# Patient Record
Sex: Male | Born: 1955
Health system: Southern US, Community
[De-identification: ages and names within clinical notes are randomized; demographics above are authoritative.]

## PROBLEM LIST (undated history)

## (undated) DIAGNOSIS — E785 Hyperlipidemia, unspecified: Secondary | ICD-10-CM

## (undated) DIAGNOSIS — J309 Allergic rhinitis, unspecified: Secondary | ICD-10-CM

## (undated) DIAGNOSIS — Z9189 Other specified personal risk factors, not elsewhere classified: Secondary | ICD-10-CM

## (undated) DIAGNOSIS — E7800 Pure hypercholesterolemia, unspecified: Secondary | ICD-10-CM

## (undated) DIAGNOSIS — K219 Gastro-esophageal reflux disease without esophagitis: Secondary | ICD-10-CM

## (undated) DIAGNOSIS — I4510 Unspecified right bundle-branch block: Secondary | ICD-10-CM

## (undated) DIAGNOSIS — B029 Zoster without complications: Secondary | ICD-10-CM

## (undated) DIAGNOSIS — K4090 Unilateral inguinal hernia, without obstruction or gangrene, not specified as recurrent: Secondary | ICD-10-CM

## (undated) DIAGNOSIS — R002 Palpitations: Secondary | ICD-10-CM

## (undated) DIAGNOSIS — M489 Spondylopathy, unspecified: Secondary | ICD-10-CM

## (undated) DIAGNOSIS — N419 Inflammatory disease of prostate, unspecified: Secondary | ICD-10-CM

## (undated) DIAGNOSIS — R0600 Dyspnea, unspecified: Secondary | ICD-10-CM

## (undated) HISTORY — DX: Allergic rhinitis, unspecified: J30.9

## (undated) HISTORY — DX: Gastro-esophageal reflux disease without esophagitis: K21.9

## (undated) HISTORY — DX: Gilbert syndrome: E80.4

## (undated) HISTORY — DX: Unilateral inguinal hernia, without obstruction or gangrene, not specified as recurrent: K40.90

## (undated) HISTORY — DX: Pure hypercholesterolemia, unspecified: E78.00

## (undated) HISTORY — DX: Dyspnea, unspecified: R06.00

## (undated) HISTORY — DX: Hyperlipidemia, unspecified: E78.5

## (undated) HISTORY — DX: Palpitations: R00.2

## (undated) HISTORY — DX: Zoster without complications: B02.9

## (undated) HISTORY — DX: Other specified personal risk factors, not elsewhere classified: Z91.89

## (undated) HISTORY — DX: Unspecified right bundle-branch block: I45.10

## (undated) HISTORY — DX: Inflammatory disease of prostate, unspecified: N41.9

## (undated) HISTORY — DX: Spondylopathy, unspecified: M48.9

---

## 1999-08-25 ENCOUNTER — Emergency Department (HOSPITAL_COMMUNITY): Admission: EM | Admit: 1999-08-25 | Discharge: 1999-08-25 | Payer: Self-pay | Admitting: Emergency Medicine

## 1999-08-26 ENCOUNTER — Encounter: Payer: Self-pay | Admitting: Geriatric Medicine

## 1999-08-26 ENCOUNTER — Encounter: Admission: RE | Admit: 1999-08-26 | Discharge: 1999-08-26 | Payer: Self-pay | Admitting: Geriatric Medicine

## 1999-10-20 ENCOUNTER — Encounter: Payer: Self-pay | Admitting: Geriatric Medicine

## 1999-10-20 ENCOUNTER — Encounter: Admission: RE | Admit: 1999-10-20 | Discharge: 1999-10-20 | Payer: Self-pay | Admitting: Geriatric Medicine

## 2001-09-04 ENCOUNTER — Encounter (INDEPENDENT_AMBULATORY_CARE_PROVIDER_SITE_OTHER): Payer: Self-pay

## 2001-09-04 ENCOUNTER — Ambulatory Visit (HOSPITAL_COMMUNITY): Admission: RE | Admit: 2001-09-04 | Discharge: 2001-09-04 | Payer: Self-pay | Admitting: Surgery

## 2002-08-09 HISTORY — PX: HERNIA REPAIR: SHX51

## 2008-04-10 ENCOUNTER — Encounter: Admission: RE | Admit: 2008-04-10 | Discharge: 2008-04-10 | Payer: Self-pay | Admitting: Gastroenterology

## 2008-10-18 IMAGING — RF DG ESOPHAGUS
15 of 18 series · 19 of 24 positions shown · IV contrast (agent unspecified)
Comparison: None

CLINICAL DATA: Chronic reflux disease

BARIUM SWALLOW / ESOPHAGRAM
TECHNIQUE: Combined double contrast and single contrast
examination performed using effervescent crystals, thick barium
liquid, and thin barium liquid.
Fluoroscopy Time: 1.9 minutes
Contrast: Double contrast barium swallow

[Series 2: run · 1 of 1 slices shown (1 of 15)]
[im 1/1]
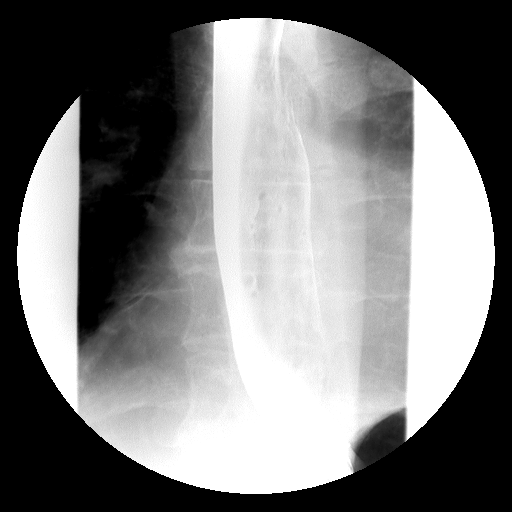

[Series 3: run · 1 of 1 slices shown (2 of 15)]
[im 1/1]
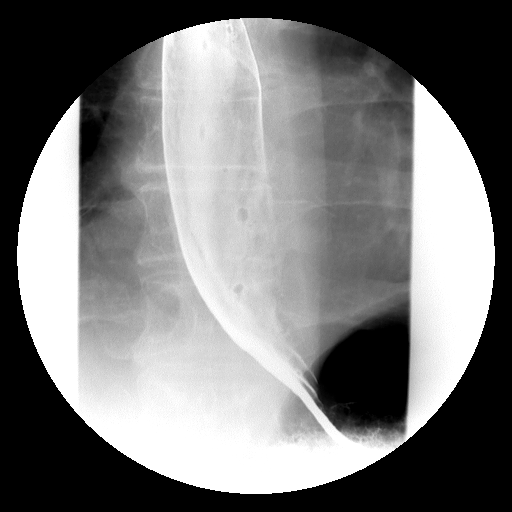

[Series 5: run · 1 of 1 slices shown (3 of 15)]
[im 1/1]
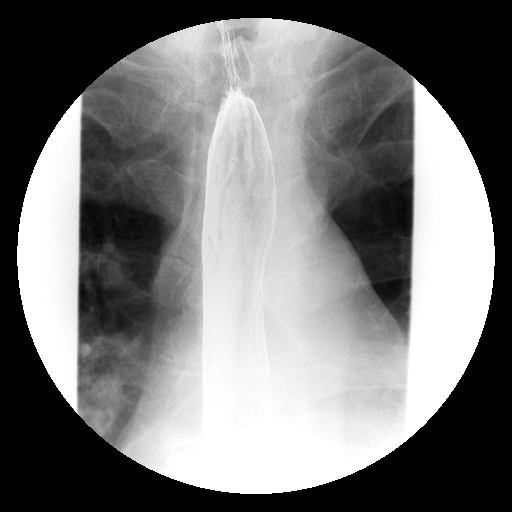

[Series 6: run · 1 of 1 slices shown (4 of 15)]
[im 1/1]
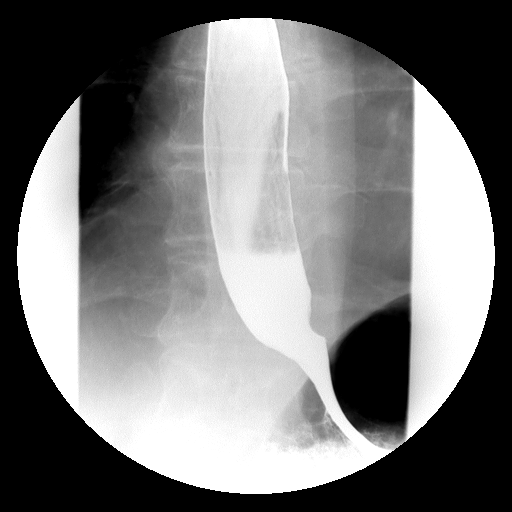

[Series 7: run · 1 of 1 slices shown (5 of 15)]
[im 1/1]
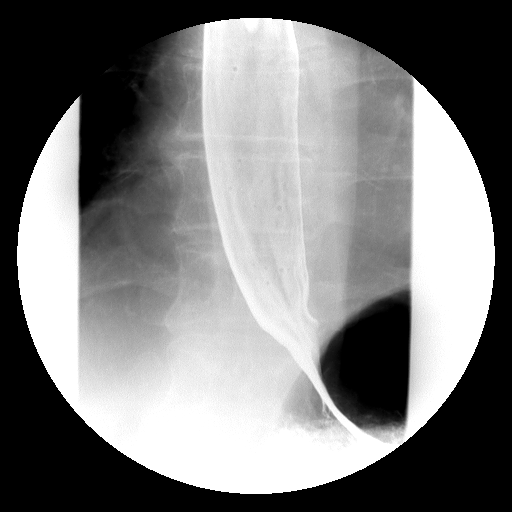

[Series 8: run · 2 of 7 slices shown (6 of 15)]
[im 1/7]
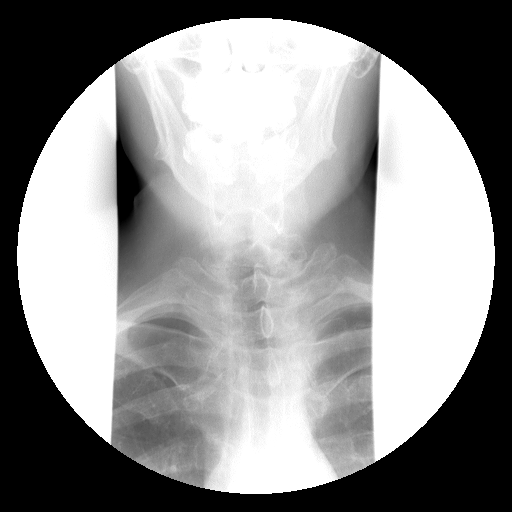
[im 7/7]
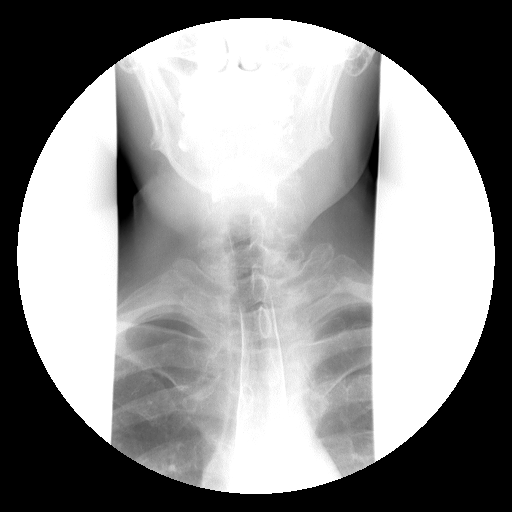

[Series 9: run · 2 of 5 slices shown (7 of 15)]
[im 1/5]
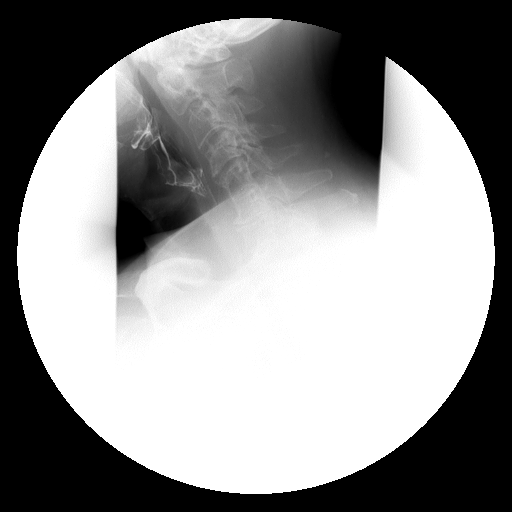
[im 5/5]
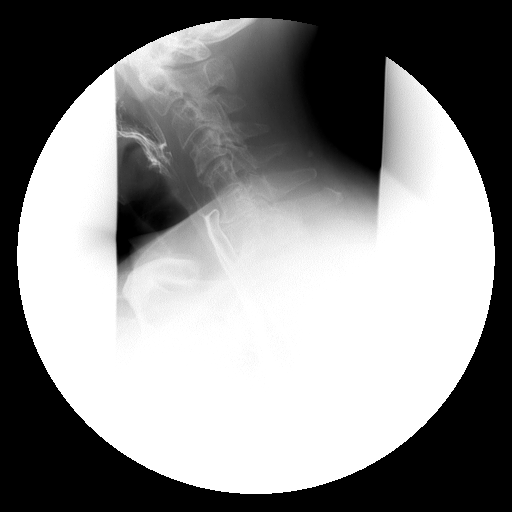

[Series 10: run · 3 of 7 slices shown (8 of 15)]
[im 3/7]
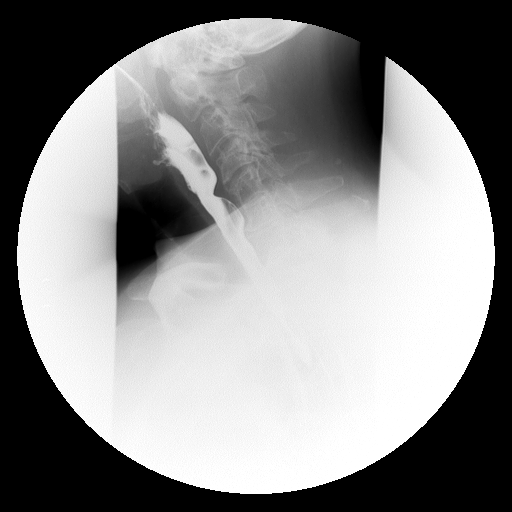
[im 5/7]
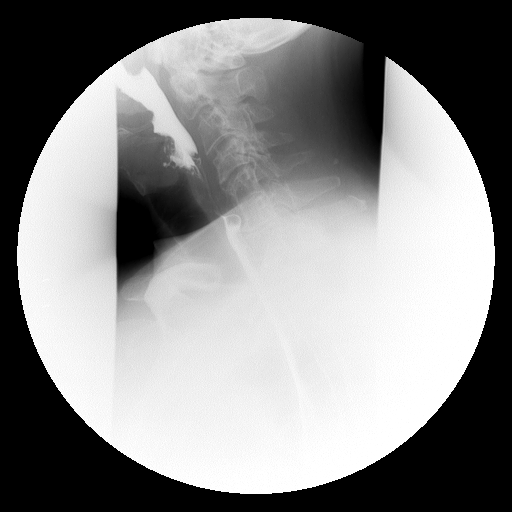
[im 7/7]
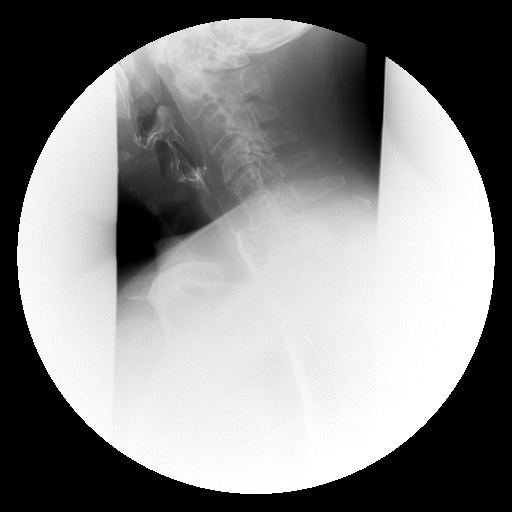

[Series 11: run · 1 of 1 slices shown (9 of 15)]
[im 1/1]
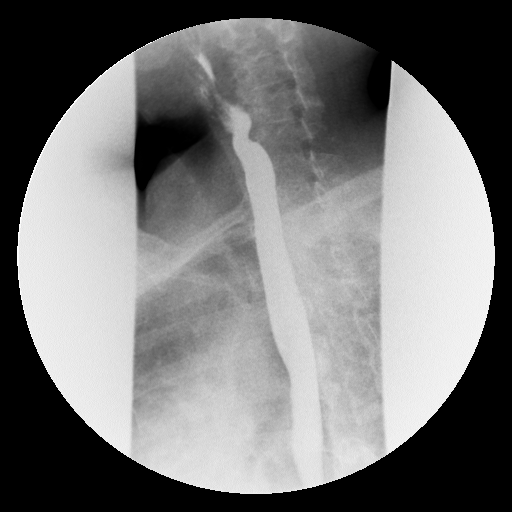

[Series 13: run · 1 of 1 slices shown (10 of 15)]
[im 1/1]
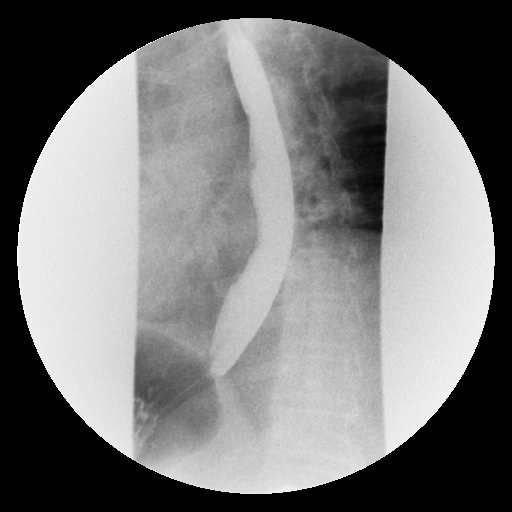

[Series 14: run · 1 of 1 slices shown (11 of 15)]
[im 1/1]
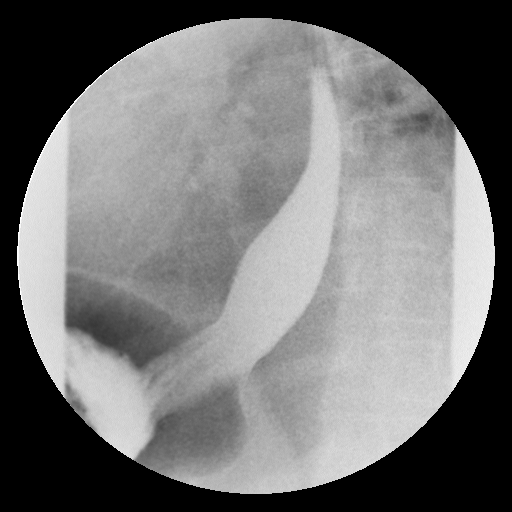

[Series 15: run · 1 of 1 slices shown (12 of 15)]
[im 1/1]
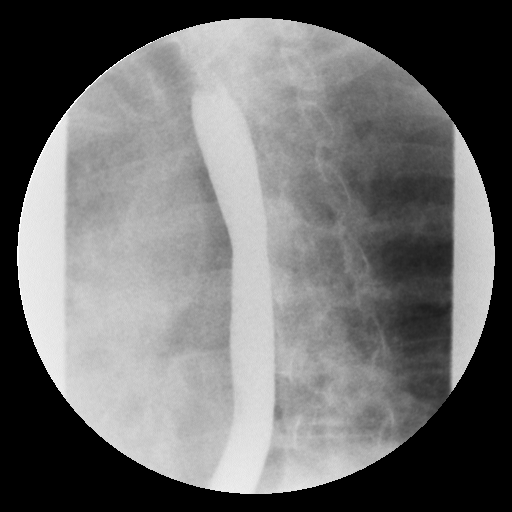

[Series 16: run · 1 of 1 slices shown (13 of 15)]
[im 1/1]
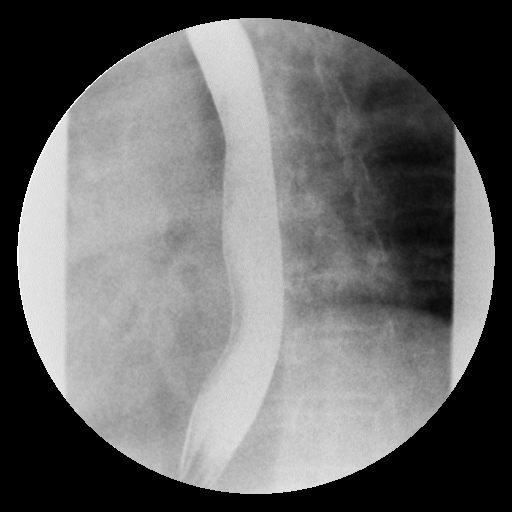

[Series 18: run · 1 of 1 slices shown (14 of 15)]
[im 1/1]
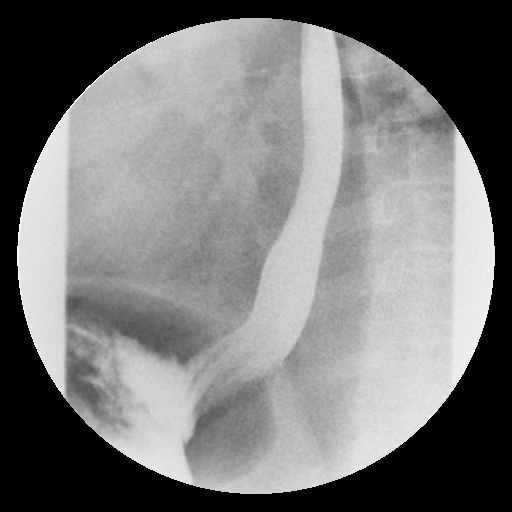

[Series 19: run · 1 of 1 slices shown (15 of 15)]
[im 1/1]
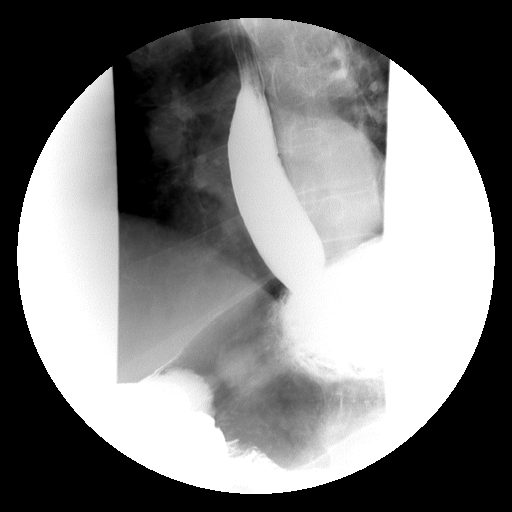

[19 of 24 positions shown; findings below may reference images not displayed]

FINDINGS: A double contrast barium swallow was initially performed.
The mucosa of the esophagus appears normal.  A single contrast
study was then performed.  The swallowing mechanism appears normal.
There is some prominence of the cricopharyngeus muscle.  There is
also slight indentation on the posterior aspect of the lower
cervical esophagus by osteophytes at the C6-7 level.  The remainder
of esophageal peristalsis is normal.  No hiatal hernia is seen.
There is mild to moderate gastroesophageal reflux noted.  A barium
pill was given at the end of the study which passed into the
stomach without delay
IMPRESSION: .
1.  Mild to moderate gastroesophageal reflux.  Somewhat prominent
cricopharyngeus muscle.
2.  Slight indentation by osteophyte anteriorly at C6-7 level.
3.  Barium pill passes in the stomach without delay.

## 2010-12-25 NOTE — Op Note (Signed)
[facility]  Patient:    Dustin Rice, Dustin Rice Visit Number: [id number] MRN: [id number]          Service Type: DSU Location: DAY Attending Physician:  Katha Cabal Dictated by:   Thornton Park Daphine Deutscher, M.D. Admit Date:  09/04/2001   CC:         Dustin Rice, M.D.                           Operative Report  CCS #:  [id number].  PREOPERATIVE DIAGNOSIS:  Right inguinal hernia, probable indirect.  POSTOPERATIVE DIAGNOSIS:  Large, right direct inguinal hernia with indirect properitoneal fatty herniation.  PROCEDURE:  Open right inguinal herniorrhaphy.  SURGEON:  Thornton Park. Daphine Deutscher, M.D.  ANESTHESIA:  Local MAC.  DESCRIPTION OF PROCEDURE:  Dustin Rice was taken to room 1 at [facility] on September 04, 2001.  He was given a dose of Ancef preoperatively and the right groin was shaved, then prepped with Betadine widely and draped sterilely.  Regional anesthesia was administered after some intravenous sedation using approximately 25 cc of a mixture of 0.5% Marcaine and lidocaine and ______ .  A small oblique incision was described, which I eventually enlarged by about 1 cm laterally to provide adequate exposure.  This area was infiltrated and the incision was made.  Electrocautery was used to control bleeding.  It was carried down to the external oblique, which was incised and opened.  The ilioinguinal nerve branch was retracted with a superior flap.  I mobilized the cord structures initially and did find prominence of the cord proximally, that I felt was probably still consistent with indirect hernia.  I went proximally and then eventually opened up the ring a little bit more.  I then incised the perimesenteric fibers without dividing them proximally, and then dissected out about a 5 cm properitoneal hernia of fat.  This was dissected free and was distinct from the vascular supply and the spermatic vessels and vas.  These were maintained and were  intact, and were separate from this.  I removed this.  He is doing a high ligation, oversewing them with 2-0 silks, and allowing it to retract up into the abdomen.  I searched diligently for any kind of sac medial to this anteriorly, and found, in fact, the floor to be quite dilated and flaccid.  We got the patient to cough and it was apparent there was a real prominent bulge all along the floor, consistent with large broad-based direct hernia.  A piece of Parietex mesh was cut to fit this area.  The mesh was sutured in place with running 2-0 Prolene along the inguinal ligament inferiorly, and into the internal oblique musculature medially; this pretty well obliterated the direct defect.  I tucked it around laterally around the cord structures, and sutured it to itself with a single horizontal mattress suture of 2-0 Prolene.  There was an adequate aperture for the cord structures as they came out into the inguinal canal.  The area was irrigated.  Repair felt strong, and he did cough with that in place.  The nerve structures and retraction on the superior flap were released.  The external oblique was closed with running 2-0 Vicryl from laterally to medial.  This put the external oblique over the lateral portion of the mesh.  The area again was irrigated.  The wound was closed with 4-0 Vicryl subcutaneously and subcuticularly; then a running subcuticular 5-0  Vicryl with Benzoin and Steri-Strips on the skin.  The patient seemed to tolerate the procedure well and was taken to the recovery room in satisfactory condition.  He will be given some Percocet (7.5 mg) to take for pain.  He will be seen back in the office in two to three weeks. Dictated by:   Thornton Park Daphine Deutscher, M.D. Attending Physician:  Katha Cabal DD:  09/04/01 TD:  09/04/01 Job: [id number]

## 2012-01-04 ENCOUNTER — Other Ambulatory Visit: Payer: Self-pay | Admitting: Oncology

## 2013-08-09 DIAGNOSIS — Z9189 Other specified personal risk factors, not elsewhere classified: Secondary | ICD-10-CM

## 2013-08-09 HISTORY — DX: Other specified personal risk factors, not elsewhere classified: Z91.89

## 2014-03-09 DIAGNOSIS — B029 Zoster without complications: Secondary | ICD-10-CM

## 2014-03-09 HISTORY — DX: Zoster without complications: B02.9

## 2016-02-12 DIAGNOSIS — M546 Pain in thoracic spine: Secondary | ICD-10-CM | POA: Diagnosis not present

## 2016-02-12 DIAGNOSIS — M542 Cervicalgia: Secondary | ICD-10-CM | POA: Diagnosis not present

## 2016-03-31 DIAGNOSIS — Z79899 Other long term (current) drug therapy: Secondary | ICD-10-CM | POA: Diagnosis not present

## 2016-03-31 DIAGNOSIS — E7800 Pure hypercholesterolemia, unspecified: Secondary | ICD-10-CM | POA: Diagnosis not present

## 2016-04-06 DIAGNOSIS — Z79899 Other long term (current) drug therapy: Secondary | ICD-10-CM | POA: Diagnosis not present

## 2016-04-06 DIAGNOSIS — Z0000 Encounter for general adult medical examination without abnormal findings: Secondary | ICD-10-CM | POA: Diagnosis not present

## 2016-04-06 DIAGNOSIS — Z125 Encounter for screening for malignant neoplasm of prostate: Secondary | ICD-10-CM | POA: Diagnosis not present

## 2016-05-12 DIAGNOSIS — R002 Palpitations: Secondary | ICD-10-CM | POA: Insufficient documentation

## 2016-05-13 ENCOUNTER — Encounter: Payer: Self-pay | Admitting: Interventional Cardiology

## 2016-05-13 ENCOUNTER — Encounter (INDEPENDENT_AMBULATORY_CARE_PROVIDER_SITE_OTHER): Payer: Self-pay

## 2016-05-13 ENCOUNTER — Ambulatory Visit (INDEPENDENT_AMBULATORY_CARE_PROVIDER_SITE_OTHER): Payer: BLUE CROSS/BLUE SHIELD | Admitting: Interventional Cardiology

## 2016-05-13 VITALS — BP 126/82 | HR 74 | Ht 68.0 in | Wt 189.8 lb

## 2016-05-13 DIAGNOSIS — R9431 Abnormal electrocardiogram [ECG] [EKG]: Secondary | ICD-10-CM | POA: Diagnosis not present

## 2016-05-13 DIAGNOSIS — R079 Chest pain, unspecified: Secondary | ICD-10-CM | POA: Diagnosis not present

## 2016-05-13 DIAGNOSIS — E784 Other hyperlipidemia: Secondary | ICD-10-CM

## 2016-05-13 DIAGNOSIS — R002 Palpitations: Secondary | ICD-10-CM

## 2016-05-13 DIAGNOSIS — E7849 Other hyperlipidemia: Secondary | ICD-10-CM

## 2016-05-13 NOTE — Progress Notes (Signed)
Cardiology Office Note    Date:  05/13/2016   ID:  Dustin, Rice Aug 25, 1955, MRN [id number]  PCP:  No primary care provider on file.  Cardiologist: Dustin Noe, MD   Chief Complaint  Patient presents with  . Chest Pain    History of Present Illness:  Dustin Rice is a 60 y.o. male who is referred for evaluation of palpitations and vague chest discomfort present over the past several months.  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 Over the past several months he has noted episodes of very focal chest discomfort that occur at random. Physical activity does not seem to precipitate the episodes. There is no shortness of breath. At other times he will notice palpitations as if his heart is racing for just a brief period of time. These symptoms also not precipitated by exertion. He has no exertional limitations. He denies lower extremity swelling. He denies orthopnea. He has never had syncope. This past summer he visited his son who is a Holiday representative at [employer] who was completing a 6 month summer study abroad program in [place]. They spent 10 days together the hiking and touring [place]. He had no difficulty with all the walking that was done.  Father and a brother have a history of coronary disease. Father had bypass surgery on 2 occasions. Not detail the brothers vascular problems. He feels it may be as simple as high blood pressure, elevated lipids, and obesity.  For at least 10 years he has a history of right bundle branch block. He has had a previous stress Myoview that was negative for evidence of ischemia. This was done greater than 10 years ago.    Past Medical History:  Diagnosis Date  . Allergic rhinitis   . Cardiovascular risk factor 08/2013   10 YEARS  . Cervical spine disease   . Dyspnea   . Esophageal reflux   . GERD (gastroesophageal reflux disease)   . Gilbert's syndrome   .  Hypercholesterolemia   . Hyperlipidemia   . Inguinal hernia    RIGHT  . Palpitations   . Prostatitis   . RBBB   . Shingles 03/2014    Past Surgical History:  Procedure Laterality Date  . HERNIA REPAIR  2004    Current Medications: Outpatient Medications Prior to Visit  Medication Sig Dispense Refill  . atorvastatin (LIPITOR) 80 MG tablet Take 80 mg by mouth daily.    . fexofenadine (ALLEGRA) 180 MG tablet Take 180 mg by mouth daily.    Marland Kitchen FLUTICASONE PROPIONATE EX Place 50 mcg into both nostrils.    Marland Kitchen ibuprofen (ADVIL,MOTRIN) 200 MG tablet Take 200 mg by mouth every 6 (six) hours as needed for mild pain.     . Melatonin 5 MG TABS Take 5 mg by mouth at bedtime as needed (sleep).     . ranitidine (ZANTAC) 75 MG tablet Take 75 mg by mouth 2 (two) times daily as needed for heartburn.    . valACYclovir (VALTREX) 1000 MG tablet Take 1,000 mg by mouth 3 (three) times daily.     No facility-administered medications prior to visit.      Allergies:   Erythromycin   Social History   Social History  . Marital status: Married    Spouse name: N/A  . Number of children: N/A  . Years of education: N/A   Social History Main Topics  . Smoking status:  Never Smoker  . Smokeless tobacco: Never Used  . Alcohol use No  . Drug use: No  . Sexual activity: Not Asked   Other Topics Concern  . None   Social History Narrative  . None     Family History:  The patient's family history includes CAD in his father; Heart failure in his father; Hypertension in his brother.   ROS:   Please see the history of present illness.    Diarrhea, back discomfort, dizziness, otherwise unremarkable.  All other systems reviewed and are negative.   PHYSICAL EXAM:   VS:  BP 126/82 (BP Location: Right Arm)   Pulse 74   Ht 5\' 8"  (1.727 m)   Wt 189 lb 12.8 oz (86.1 kg)   BMI 28.86 kg/m    GEN: Well nourished, well developed, in no acute distress  HEENT: normal  Neck: no JVD, carotid bruits, or  masses Cardiac: RRR; no murmurs, rubs, or gallops,no edema  Respiratory:  clear to auscultation bilaterally, normal work of breathing GI: soft, nontender, nondistended, + BS MS: no deformity or atrophy  Skin: warm and dry, no rash Neuro:  Alert and Oriented x 3, Strength and sensation are intact Psych: euthymic mood, full affect  Wt Readings from Last 3 Encounters:  05/13/16 189 lb 12.8 oz (86.1 kg)      Studies/Labs Reviewed:   EKG:  EKG  Normal sinus rhythm at 74 bpm, left atrial abnormality, right bundle branch block, and otherwise unremarkable.  Recent Labs: No results found for requested labs within last 8760 hours.   Lipid Panel No results found for: CHOL, TRIG, HDL, CHOLHDL, VLDL, LDLCALC, LDLDIRECT  Additional studies/ records that were reviewed today include:  I reviewed data from [facility]/Dr. Pete Rice. Records document the presence of right bundle branch block.    ASSESSMENT:    1. Chest pain, unspecified type   2. Palpitations   3. Abnormal EKG   4. Other hyperlipidemia      PLAN:  In order of problems listed above:  1. Chest discomfort is atypical. Given the significant hyperlipidemia despite aggressive statin therapy, myocardial ischemia needs to be excluded. I have recommended a stress Myoview. He has claustrophobia. I gave a prescription for an ex 0.25 mg to be taken 5 minutes prior to walking on the treadmill. Hopefully by the time he has to get under the scanner medication will be taking effect. His wife or someone will need to drive him home.  2. 48-hour Holter monitor will be done to exclude the possibility of under recognized atrial fibrillation or other potentially consequential arrhythmia. 3. Right bundle is present without other abnormality. It is been present for several years. 4. Lipids are markedly elevated despite maximum dose atorvastatin. Additional considerations to achieve LDL goal 100 would be adding Zetia.    Medication Adjustments/Labs  and Tests Ordered: Current medicines are reviewed at length with the patient today.  Concerns regarding medicines are outlined above.  Medication changes, Labs and Tests ordered today are listed in the Patient Instructions below. Patient Instructions  Medication Instructions:  1) Xanax 0.25mg - take one tablet 30 minutes prior to scan  Labwork: None  Testing/Procedures: Your physician has requested that you have en exercise stress myoview. For further information please visit [email or web address] Please follow instruction sheet, as given.  Your physician has recommended that you wear a 48 hour holter monitor. Holter monitors are medical devices that record the heart's electrical activity. Doctors most often use these monitors to diagnose arrhythmias. Arrhythmias are  problems with the speed or rhythm of the heartbeat. The monitor is a small, portable device. You can wear one while you do your normal daily activities. This is usually used to diagnose what is causing palpitations/syncope (passing out).    Follow-Up: Your physician recommends that you schedule a follow-up appointment as needed with Dr. Katrinka BlazingSmith.   Any Other Special Instructions Will Be Listed Below (If Applicable).     If you need a refill on your cardiac medications before your next appointment, please call your pharmacy.      Signed, Dustin NoeHenry W Smith III, MD  05/13/2016 5:15 PM    [facility] [address], [place], [place]  [address] Phone: [phone]; Fax: [phone]

## 2016-05-13 NOTE — Patient Instructions (Addendum)
Medication Instructions:  1) Xanax 0.25mg - take one tablet 30 minutes prior to scan  Labwork: None  Testing/Procedures: Your physician has requested that you have en exercise stress myoview. For further information please visit [email or web address] Please follow instruction sheet, as given.  Your physician has recommended that you wear a 48 hour holter monitor. Holter monitors are medical devices that record the heart's electrical activity. Doctors most often use these monitors to diagnose arrhythmias. Arrhythmias are problems with the speed or rhythm of the heartbeat. The monitor is a small, portable device. You can wear one while you do your normal daily activities. This is usually used to diagnose what is causing palpitations/syncope (passing out).    Follow-Up: Your physician recommends that you schedule a follow-up appointment as needed with Dr. Katrinka BlazingSmith.   Any Other Special Instructions Will Be Listed Below (If Applicable).     If you need a refill on your cardiac medications before your next appointment, please call your pharmacy.

## 2016-06-02 ENCOUNTER — Ambulatory Visit (HOSPITAL_COMMUNITY): Payer: BLUE CROSS/BLUE SHIELD

## 2016-06-22 DIAGNOSIS — Z23 Encounter for immunization: Secondary | ICD-10-CM | POA: Diagnosis not present

## 2016-08-27 DIAGNOSIS — H16042 Marginal corneal ulcer, left eye: Secondary | ICD-10-CM | POA: Diagnosis not present

## 2016-08-27 DIAGNOSIS — H01022 Squamous blepharitis right lower eyelid: Secondary | ICD-10-CM | POA: Diagnosis not present

## 2016-08-27 DIAGNOSIS — H01024 Squamous blepharitis left upper eyelid: Secondary | ICD-10-CM | POA: Diagnosis not present

## 2016-08-27 DIAGNOSIS — H01021 Squamous blepharitis right upper eyelid: Secondary | ICD-10-CM | POA: Diagnosis not present

## 2016-09-01 DIAGNOSIS — H01024 Squamous blepharitis left upper eyelid: Secondary | ICD-10-CM | POA: Diagnosis not present

## 2016-09-01 DIAGNOSIS — H01021 Squamous blepharitis right upper eyelid: Secondary | ICD-10-CM | POA: Diagnosis not present

## 2016-09-01 DIAGNOSIS — H01022 Squamous blepharitis right lower eyelid: Secondary | ICD-10-CM | POA: Diagnosis not present

## 2016-09-01 DIAGNOSIS — H16042 Marginal corneal ulcer, left eye: Secondary | ICD-10-CM | POA: Diagnosis not present

## 2016-09-13 ENCOUNTER — Encounter: Payer: Self-pay | Admitting: Interventional Cardiology

## 2016-09-15 DIAGNOSIS — H01021 Squamous blepharitis right upper eyelid: Secondary | ICD-10-CM | POA: Diagnosis not present

## 2016-09-15 DIAGNOSIS — H16042 Marginal corneal ulcer, left eye: Secondary | ICD-10-CM | POA: Diagnosis not present

## 2016-09-15 DIAGNOSIS — H01022 Squamous blepharitis right lower eyelid: Secondary | ICD-10-CM | POA: Diagnosis not present

## 2016-09-15 DIAGNOSIS — H01024 Squamous blepharitis left upper eyelid: Secondary | ICD-10-CM | POA: Diagnosis not present

## 2016-09-27 ENCOUNTER — Encounter: Payer: Self-pay | Admitting: Interventional Cardiology

## 2016-10-19 ENCOUNTER — Telehealth: Payer: Self-pay | Admitting: Interventional Cardiology

## 2016-10-19 NOTE — Telephone Encounter (Signed)
I spoke with patient he advised that he was unable to afford the monitor and stress test at this time that he will call back when the finances is there to get this schedule.  I have removed the order from the system at this time.    stpegram

## 2016-10-20 DIAGNOSIS — H2513 Age-related nuclear cataract, bilateral: Secondary | ICD-10-CM | POA: Diagnosis not present

## 2016-10-20 DIAGNOSIS — H16302 Unspecified interstitial keratitis, left eye: Secondary | ICD-10-CM | POA: Diagnosis not present

## 2016-10-20 DIAGNOSIS — H01021 Squamous blepharitis right upper eyelid: Secondary | ICD-10-CM | POA: Diagnosis not present

## 2016-10-20 DIAGNOSIS — H01022 Squamous blepharitis right lower eyelid: Secondary | ICD-10-CM | POA: Diagnosis not present

## 2016-11-15 DIAGNOSIS — H01024 Squamous blepharitis left upper eyelid: Secondary | ICD-10-CM | POA: Diagnosis not present

## 2016-11-15 DIAGNOSIS — H01025 Squamous blepharitis left lower eyelid: Secondary | ICD-10-CM | POA: Diagnosis not present

## 2016-11-15 DIAGNOSIS — H01022 Squamous blepharitis right lower eyelid: Secondary | ICD-10-CM | POA: Diagnosis not present

## 2016-11-15 DIAGNOSIS — H01021 Squamous blepharitis right upper eyelid: Secondary | ICD-10-CM | POA: Diagnosis not present

## 2016-12-06 DIAGNOSIS — H1712 Central corneal opacity, left eye: Secondary | ICD-10-CM | POA: Diagnosis not present

## 2016-12-06 DIAGNOSIS — H01022 Squamous blepharitis right lower eyelid: Secondary | ICD-10-CM | POA: Diagnosis not present

## 2016-12-06 DIAGNOSIS — H01021 Squamous blepharitis right upper eyelid: Secondary | ICD-10-CM | POA: Diagnosis not present

## 2016-12-06 DIAGNOSIS — H16302 Unspecified interstitial keratitis, left eye: Secondary | ICD-10-CM | POA: Diagnosis not present

## 2016-12-20 DIAGNOSIS — H16302 Unspecified interstitial keratitis, left eye: Secondary | ICD-10-CM | POA: Diagnosis not present

## 2016-12-20 DIAGNOSIS — H01021 Squamous blepharitis right upper eyelid: Secondary | ICD-10-CM | POA: Diagnosis not present

## 2016-12-20 DIAGNOSIS — H01022 Squamous blepharitis right lower eyelid: Secondary | ICD-10-CM | POA: Diagnosis not present

## 2016-12-20 DIAGNOSIS — H1712 Central corneal opacity, left eye: Secondary | ICD-10-CM | POA: Diagnosis not present

## 2016-12-27 DIAGNOSIS — H43392 Other vitreous opacities, left eye: Secondary | ICD-10-CM | POA: Diagnosis not present

## 2016-12-29 DIAGNOSIS — H01021 Squamous blepharitis right upper eyelid: Secondary | ICD-10-CM | POA: Diagnosis not present

## 2016-12-29 DIAGNOSIS — H1712 Central corneal opacity, left eye: Secondary | ICD-10-CM | POA: Diagnosis not present

## 2016-12-29 DIAGNOSIS — H01022 Squamous blepharitis right lower eyelid: Secondary | ICD-10-CM | POA: Diagnosis not present

## 2016-12-29 DIAGNOSIS — H16302 Unspecified interstitial keratitis, left eye: Secondary | ICD-10-CM | POA: Diagnosis not present

## 2017-04-06 DIAGNOSIS — H01024 Squamous blepharitis left upper eyelid: Secondary | ICD-10-CM | POA: Diagnosis not present

## 2017-04-06 DIAGNOSIS — H16302 Unspecified interstitial keratitis, left eye: Secondary | ICD-10-CM | POA: Diagnosis not present

## 2017-04-06 DIAGNOSIS — H01022 Squamous blepharitis right lower eyelid: Secondary | ICD-10-CM | POA: Diagnosis not present

## 2017-04-06 DIAGNOSIS — H01021 Squamous blepharitis right upper eyelid: Secondary | ICD-10-CM | POA: Diagnosis not present

## 2017-04-27 DIAGNOSIS — Z79899 Other long term (current) drug therapy: Secondary | ICD-10-CM | POA: Diagnosis not present

## 2017-04-27 DIAGNOSIS — E7800 Pure hypercholesterolemia, unspecified: Secondary | ICD-10-CM | POA: Diagnosis not present

## 2017-04-27 DIAGNOSIS — Z125 Encounter for screening for malignant neoplasm of prostate: Secondary | ICD-10-CM | POA: Diagnosis not present

## 2017-04-27 DIAGNOSIS — Z23 Encounter for immunization: Secondary | ICD-10-CM | POA: Diagnosis not present

## 2017-04-27 DIAGNOSIS — Z0000 Encounter for general adult medical examination without abnormal findings: Secondary | ICD-10-CM | POA: Diagnosis not present

## 2017-04-29 DIAGNOSIS — M542 Cervicalgia: Secondary | ICD-10-CM | POA: Diagnosis not present

## 2017-05-03 DIAGNOSIS — H6121 Impacted cerumen, right ear: Secondary | ICD-10-CM | POA: Diagnosis not present

## 2017-05-11 DIAGNOSIS — M5031 Other cervical disc degeneration,  high cervical region: Secondary | ICD-10-CM | POA: Diagnosis not present

## 2017-05-11 DIAGNOSIS — M1991 Primary osteoarthritis, unspecified site: Secondary | ICD-10-CM | POA: Diagnosis not present

## 2017-05-17 DIAGNOSIS — M1991 Primary osteoarthritis, unspecified site: Secondary | ICD-10-CM | POA: Diagnosis not present

## 2017-05-17 DIAGNOSIS — M5031 Other cervical disc degeneration,  high cervical region: Secondary | ICD-10-CM | POA: Diagnosis not present

## 2017-05-19 DIAGNOSIS — M1991 Primary osteoarthritis, unspecified site: Secondary | ICD-10-CM | POA: Diagnosis not present

## 2017-05-19 DIAGNOSIS — M5031 Other cervical disc degeneration,  high cervical region: Secondary | ICD-10-CM | POA: Diagnosis not present

## 2017-05-24 DIAGNOSIS — M5031 Other cervical disc degeneration,  high cervical region: Secondary | ICD-10-CM | POA: Diagnosis not present

## 2017-05-24 DIAGNOSIS — M1991 Primary osteoarthritis, unspecified site: Secondary | ICD-10-CM | POA: Diagnosis not present

## 2017-05-26 DIAGNOSIS — M5031 Other cervical disc degeneration,  high cervical region: Secondary | ICD-10-CM | POA: Diagnosis not present

## 2017-05-26 DIAGNOSIS — M1991 Primary osteoarthritis, unspecified site: Secondary | ICD-10-CM | POA: Diagnosis not present

## 2017-05-30 DIAGNOSIS — M1991 Primary osteoarthritis, unspecified site: Secondary | ICD-10-CM | POA: Diagnosis not present

## 2017-05-30 DIAGNOSIS — M5031 Other cervical disc degeneration,  high cervical region: Secondary | ICD-10-CM | POA: Diagnosis not present

## 2017-06-02 DIAGNOSIS — M5031 Other cervical disc degeneration,  high cervical region: Secondary | ICD-10-CM | POA: Diagnosis not present

## 2017-06-02 DIAGNOSIS — M1991 Primary osteoarthritis, unspecified site: Secondary | ICD-10-CM | POA: Diagnosis not present

## 2017-06-07 DIAGNOSIS — M5031 Other cervical disc degeneration,  high cervical region: Secondary | ICD-10-CM | POA: Diagnosis not present

## 2017-06-07 DIAGNOSIS — M1991 Primary osteoarthritis, unspecified site: Secondary | ICD-10-CM | POA: Diagnosis not present

## 2017-06-09 DIAGNOSIS — M1991 Primary osteoarthritis, unspecified site: Secondary | ICD-10-CM | POA: Diagnosis not present

## 2017-06-09 DIAGNOSIS — M5031 Other cervical disc degeneration,  high cervical region: Secondary | ICD-10-CM | POA: Diagnosis not present

## 2017-06-16 DIAGNOSIS — M1991 Primary osteoarthritis, unspecified site: Secondary | ICD-10-CM | POA: Diagnosis not present

## 2017-06-16 DIAGNOSIS — M5031 Other cervical disc degeneration,  high cervical region: Secondary | ICD-10-CM | POA: Diagnosis not present

## 2017-06-29 DIAGNOSIS — H01022 Squamous blepharitis right lower eyelid: Secondary | ICD-10-CM | POA: Diagnosis not present

## 2017-06-29 DIAGNOSIS — H01021 Squamous blepharitis right upper eyelid: Secondary | ICD-10-CM | POA: Diagnosis not present

## 2017-06-29 DIAGNOSIS — H16302 Unspecified interstitial keratitis, left eye: Secondary | ICD-10-CM | POA: Diagnosis not present

## 2017-06-29 DIAGNOSIS — H1712 Central corneal opacity, left eye: Secondary | ICD-10-CM | POA: Diagnosis not present

## 2017-07-20 DIAGNOSIS — J069 Acute upper respiratory infection, unspecified: Secondary | ICD-10-CM | POA: Diagnosis not present

## 2017-07-20 DIAGNOSIS — B9789 Other viral agents as the cause of diseases classified elsewhere: Secondary | ICD-10-CM | POA: Diagnosis not present

## 2017-10-05 DIAGNOSIS — H1712 Central corneal opacity, left eye: Secondary | ICD-10-CM | POA: Diagnosis not present

## 2017-10-05 DIAGNOSIS — H01022 Squamous blepharitis right lower eyelid: Secondary | ICD-10-CM | POA: Diagnosis not present

## 2017-10-05 DIAGNOSIS — H16302 Unspecified interstitial keratitis, left eye: Secondary | ICD-10-CM | POA: Diagnosis not present

## 2017-10-05 DIAGNOSIS — H01021 Squamous blepharitis right upper eyelid: Secondary | ICD-10-CM | POA: Diagnosis not present

## 2017-11-28 DIAGNOSIS — H9201 Otalgia, right ear: Secondary | ICD-10-CM | POA: Diagnosis not present

## 2018-02-06 DIAGNOSIS — H2513 Age-related nuclear cataract, bilateral: Secondary | ICD-10-CM | POA: Diagnosis not present

## 2018-02-06 DIAGNOSIS — H17822 Peripheral opacity of cornea, left eye: Secondary | ICD-10-CM | POA: Diagnosis not present

## 2018-02-06 DIAGNOSIS — H16302 Unspecified interstitial keratitis, left eye: Secondary | ICD-10-CM | POA: Diagnosis not present

## 2018-05-02 DIAGNOSIS — Z79899 Other long term (current) drug therapy: Secondary | ICD-10-CM | POA: Diagnosis not present

## 2018-05-02 DIAGNOSIS — Z0000 Encounter for general adult medical examination without abnormal findings: Secondary | ICD-10-CM | POA: Diagnosis not present

## 2018-05-02 DIAGNOSIS — Z125 Encounter for screening for malignant neoplasm of prostate: Secondary | ICD-10-CM | POA: Diagnosis not present

## 2018-05-02 DIAGNOSIS — Z23 Encounter for immunization: Secondary | ICD-10-CM | POA: Diagnosis not present

## 2018-05-02 DIAGNOSIS — E7800 Pure hypercholesterolemia, unspecified: Secondary | ICD-10-CM | POA: Diagnosis not present

## 2018-08-08 ENCOUNTER — Ambulatory Visit (INDEPENDENT_AMBULATORY_CARE_PROVIDER_SITE_OTHER): Payer: Self-pay

## 2018-08-08 ENCOUNTER — Ambulatory Visit (INDEPENDENT_AMBULATORY_CARE_PROVIDER_SITE_OTHER): Payer: BLUE CROSS/BLUE SHIELD | Admitting: Orthopaedic Surgery

## 2018-08-08 DIAGNOSIS — M25571 Pain in right ankle and joints of right foot: Secondary | ICD-10-CM | POA: Diagnosis not present

## 2018-08-08 DIAGNOSIS — M79671 Pain in right foot: Secondary | ICD-10-CM

## 2018-08-08 DIAGNOSIS — M25511 Pain in right shoulder: Secondary | ICD-10-CM | POA: Diagnosis not present

## 2018-08-08 DIAGNOSIS — M545 Low back pain, unspecified: Secondary | ICD-10-CM

## 2018-08-08 DIAGNOSIS — G8929 Other chronic pain: Secondary | ICD-10-CM

## 2018-08-08 DIAGNOSIS — M25512 Pain in left shoulder: Secondary | ICD-10-CM | POA: Diagnosis not present

## 2018-08-08 MED ORDER — LIDOCAINE HCL 1 % IJ SOLN
1.0000 mL | INTRAMUSCULAR | Status: AC | PRN
  Administered 2018-08-08: 1 mL

## 2018-08-08 MED ORDER — METHYLPREDNISOLONE ACETATE 40 MG/ML IJ SUSP
40.0000 mg | INTRAMUSCULAR | Status: AC | PRN
  Administered 2018-08-08: 40 mg

## 2018-08-08 NOTE — Progress Notes (Signed)
Office Visit Note   Patient: Dustin Rice           Date of Birth: 01/20/1956           MRN: [id number] Visit Date: 08/08/2018              Requested by: No referring provider defined for this encounter. PCP: Patient, No Pcp Per   Assessment & Plan: Visit Diagnoses:  1. Chronic pain in right shoulder   2. Right foot pain   3. Low back pain, unspecified back pain laterality, unspecified chronicity, unspecified whether sciatica present     Plan: Impression is #1 nonspecific right shoulder pain which is likely age-related wear and tear.  I did offer her a glenohumeral intra-articular steroid injection but he would like to try IM Toradol injection for his back and shoulder first.  I do recommend physical therapy in addition to this.  #2 mid back and low back pain which is stable and consistent with spondylosis.  We gave him IM Toradol injection today.  Referral to physical therapy.  #3 right foot pain likely Morton's neuroma.  Injection performed today into the third webspace under sterile conditions.  Patient tolerates well.  Questions encouraged and answered.  Follow-up as needed.  Follow-Up Instructions: Return if symptoms worsen or fail to improve.   Orders:  Orders Placed This Encounter  Procedures  . XR Foot Complete Right  . XR Lumbar Spine 2-3 Views  . XR Shoulder Right  . XR Shoulder Left   No orders of the defined types were placed in this encounter.     Procedures: Foot Inj Date/Time: 08/08/2018 10:28 AM Performed by: Tarry KosXu, Naiping M, MD Authorized by: Tarry KosXu, Naiping M, MD   Indications:  Neuroma Condition: Morton's Neuroma   Location:  R foot Needle Size:  25 G Approach:  Dorsal Medications:  1 mL lidocaine 1 %; 40 mg methylPREDNISolone acetate 40 MG/ML     Clinical Data: No additional findings.   Subjective: Chief Complaint  Patient presents with  . Right Foot - Pain  . Right Shoulder - Pain  . Left Shoulder - Pain  . Lower Back - Pain    Dustin Rice is a 62 year old gentleman who comes in with complaints of his right shoulder pain, right foot pain, back pain.  In terms of his right shoulder he denies any radicular symptoms.  He does endorse some grinding and popping with certain movement of his right shoulder.  Denies any numbness and tingling.  Denies any injuries.  Pain is worse with laying on her his right side at night.  In terms of his right foot he feels like he is walking on a marble underneath his third metatarsal head.  He has been wearing orthotics which has helped.  He has never had an injection before.  Denies any injuries.  Occasionally he will have some numbness and burning pain.  In terms of his low back he states that it tightens up after he has been standing for a while.  He denies any radicular symptoms.  Denies any bowel or bladder symptoms.   Review of Systems  Constitutional: Negative.   All other systems reviewed and are negative.    Objective: Vital Signs: There were no vitals taken for this visit.  Physical Exam Vitals signs and nursing note reviewed.  Constitutional:      Appearance: He is well-developed.  HENT:     Head: Normocephalic and atraumatic.  Eyes:  Pupils: Pupils are equal, round, and reactive to light.  Neck:     Musculoskeletal: Neck supple.  Pulmonary:     Effort: Pulmonary effort is normal.  Abdominal:     Palpations: Abdomen is soft.  Musculoskeletal: Normal range of motion.  Skin:    General: Skin is warm.  Neurological:     Mental Status: He is alert and oriented to person, place, and time.  Psychiatric:        Behavior: Behavior normal.        Thought Content: Thought content normal.        Judgment: Judgment normal.     Ortho Exam Right shoulder exam shows full range of motion with minimal discomfort.  Shoulder is normal to manual muscle testing.  He has mild discomfort with labral testing, biceps testing, rotator cuff testing. Lumbar exam shows mild discomfort with  palpation of the spinous processes.  No focal motor or sensory deficits.  Normal reflexes. Right foot exam shows no swelling or skin lesions.  He has tenderness with palpation of the third intermetatarsal space. Specialty Comments:  No specialty comments available.  Imaging: Xr Foot Complete Right  Result Date: 08/08/2018 No acute or structural abnormalities  Xr Lumbar Spine 2-3 Views  Result Date: 08/08/2018 Mild lumbar spondylosis.  No structural or acute abnormalities  Xr Shoulder Left  Result Date: 08/08/2018 No acute or structural abnormalities  Xr Shoulder Right  Result Date: 08/08/2018 No acute or structural abnormalities    PMFS History: Patient Active Problem List   Diagnosis Date Noted  . Palpitations 05/12/2016   Past Medical History:  Diagnosis Date  . Allergic rhinitis   . Cardiovascular risk factor 08/2013   10 YEARS  . Cervical spine disease   . Dyspnea   . Esophageal reflux   . GERD (gastroesophageal reflux disease)   . Gilbert's syndrome   . Hypercholesterolemia   . Hyperlipidemia   . Inguinal hernia    RIGHT  . Palpitations   . Prostatitis   . RBBB   . Shingles 03/2014    Family History  Problem Relation Age of Onset  . CAD Father   . Heart failure Father   . Hypertension Brother      Social History   Occupational History  . Not on file  Tobacco Use  . Smoking status: Never Smoker  . Smokeless tobacco: Never Used  Substance and Sexual Activity  . Alcohol use: No  . Drug use: No  . Sexual activity: Not on file

## 2018-10-11 DIAGNOSIS — L601 Onycholysis: Secondary | ICD-10-CM | POA: Diagnosis not present

## 2019-05-08 DIAGNOSIS — Z0000 Encounter for general adult medical examination without abnormal findings: Secondary | ICD-10-CM | POA: Diagnosis not present

## 2019-05-08 DIAGNOSIS — E7800 Pure hypercholesterolemia, unspecified: Secondary | ICD-10-CM | POA: Diagnosis not present

## 2019-05-08 DIAGNOSIS — Z79899 Other long term (current) drug therapy: Secondary | ICD-10-CM | POA: Diagnosis not present

## 2019-05-08 DIAGNOSIS — Z125 Encounter for screening for malignant neoplasm of prostate: Secondary | ICD-10-CM | POA: Diagnosis not present

## 2019-05-08 DIAGNOSIS — Z23 Encounter for immunization: Secondary | ICD-10-CM | POA: Diagnosis not present

## 2019-06-01 ENCOUNTER — Encounter (INDEPENDENT_AMBULATORY_CARE_PROVIDER_SITE_OTHER): Payer: Self-pay

## 2019-06-06 DIAGNOSIS — R972 Elevated prostate specific antigen [PSA]: Secondary | ICD-10-CM | POA: Diagnosis not present

## 2019-06-06 DIAGNOSIS — N5201 Erectile dysfunction due to arterial insufficiency: Secondary | ICD-10-CM | POA: Diagnosis not present

## 2019-06-12 DIAGNOSIS — K921 Melena: Secondary | ICD-10-CM | POA: Diagnosis not present

## 2019-06-12 DIAGNOSIS — K648 Other hemorrhoids: Secondary | ICD-10-CM | POA: Diagnosis not present

## 2019-06-12 DIAGNOSIS — K219 Gastro-esophageal reflux disease without esophagitis: Secondary | ICD-10-CM | POA: Diagnosis not present

## 2019-07-13 DIAGNOSIS — H2513 Age-related nuclear cataract, bilateral: Secondary | ICD-10-CM | POA: Diagnosis not present

## 2019-07-13 DIAGNOSIS — H179 Unspecified corneal scar and opacity: Secondary | ICD-10-CM | POA: Diagnosis not present

## 2019-07-13 DIAGNOSIS — H16142 Punctate keratitis, left eye: Secondary | ICD-10-CM | POA: Diagnosis not present

## 2019-07-13 DIAGNOSIS — H1045 Other chronic allergic conjunctivitis: Secondary | ICD-10-CM | POA: Diagnosis not present

## 2019-08-16 ENCOUNTER — Ambulatory Visit: Payer: BC Managed Care – PPO | Attending: Internal Medicine

## 2019-08-16 DIAGNOSIS — Z20822 Contact with and (suspected) exposure to covid-19: Secondary | ICD-10-CM | POA: Diagnosis not present

## 2019-08-18 LAB — NOVEL CORONAVIRUS, NAA: SARS-CoV-2, NAA: NOT DETECTED

## 2019-09-20 ENCOUNTER — Ambulatory Visit: Payer: BC Managed Care – PPO

## 2019-09-22 ENCOUNTER — Ambulatory Visit: Payer: BC Managed Care – PPO

## 2020-03-25 DIAGNOSIS — Z03818 Encounter for observation for suspected exposure to other biological agents ruled out: Secondary | ICD-10-CM | POA: Diagnosis not present

## 2020-03-25 DIAGNOSIS — Z20822 Contact with and (suspected) exposure to covid-19: Secondary | ICD-10-CM | POA: Diagnosis not present

## 2020-04-23 DIAGNOSIS — Z20828 Contact with and (suspected) exposure to other viral communicable diseases: Secondary | ICD-10-CM | POA: Diagnosis not present

## 2020-04-23 DIAGNOSIS — Z03818 Encounter for observation for suspected exposure to other biological agents ruled out: Secondary | ICD-10-CM | POA: Diagnosis not present

## 2020-04-23 DIAGNOSIS — Z20822 Contact with and (suspected) exposure to covid-19: Secondary | ICD-10-CM | POA: Diagnosis not present

## 2020-05-13 DIAGNOSIS — Z23 Encounter for immunization: Secondary | ICD-10-CM | POA: Diagnosis not present

## 2020-05-13 DIAGNOSIS — Z0000 Encounter for general adult medical examination without abnormal findings: Secondary | ICD-10-CM | POA: Diagnosis not present

## 2020-05-13 DIAGNOSIS — Z79899 Other long term (current) drug therapy: Secondary | ICD-10-CM | POA: Diagnosis not present

## 2020-05-13 DIAGNOSIS — K9089 Other intestinal malabsorption: Secondary | ICD-10-CM | POA: Diagnosis not present

## 2020-05-13 DIAGNOSIS — Z125 Encounter for screening for malignant neoplasm of prostate: Secondary | ICD-10-CM | POA: Diagnosis not present

## 2020-05-13 DIAGNOSIS — E7800 Pure hypercholesterolemia, unspecified: Secondary | ICD-10-CM | POA: Diagnosis not present

## 2020-07-02 ENCOUNTER — Ambulatory Visit: Payer: Self-pay

## 2020-07-02 ENCOUNTER — Other Ambulatory Visit: Payer: Self-pay

## 2020-07-02 ENCOUNTER — Encounter: Payer: Self-pay | Admitting: Orthopaedic Surgery

## 2020-07-02 ENCOUNTER — Ambulatory Visit: Payer: BC Managed Care – PPO | Admitting: Orthopaedic Surgery

## 2020-07-02 DIAGNOSIS — M5442 Lumbago with sciatica, left side: Secondary | ICD-10-CM

## 2020-07-02 DIAGNOSIS — M5441 Lumbago with sciatica, right side: Secondary | ICD-10-CM | POA: Diagnosis not present

## 2020-07-02 MED ORDER — MELOXICAM 7.5 MG PO TABS: 15.0000 mg | ORAL_TABLET | Freq: Every day | ORAL | 2 refills | Status: DC

## 2020-07-02 MED ORDER — METAXALONE 800 MG PO TABS: 800.0000 mg | ORAL_TABLET | Freq: Three times a day (TID) | ORAL | 2 refills | Status: DC

## 2020-07-02 MED ORDER — PREDNISONE 10 MG (21) PO TBPK: ORAL_TABLET | ORAL | 2 refills | Status: DC

## 2020-07-02 NOTE — Progress Notes (Signed)
Office Visit Note   Patient: Dustin Rice           Date of Birth: 05-Jul-1956           MRN: [id number] Visit Date: 07/02/2020              Requested by: No referring provider defined for this encounter. PCP: Patient, No Pcp Per   Assessment & Plan: Visit Diagnoses:  1. Acute bilateral low back pain with bilateral sciatica     Plan: Impression is exacerbation of lumbar spondylosis and muscular strain.  Prescription for prednisone Dosepak, Skelaxin, meloxicam provided today.  Continue use heat as needed.  Follow-up as needed.  Follow-Up Instructions: Return if symptoms worsen or fail to improve.   Orders:  Orders Placed This Encounter  Procedures  . XR Lumbar Spine 2-3 Views   Meds ordered this encounter  Medications  . predniSONE (STERAPRED UNI-PAK 21 TAB) 10 MG (21) TBPK tablet    Sig: Take as directed    Dispense:  21 tablet    Refill:  2  . metaxalone (SKELAXIN) 800 MG tablet    Sig: Take 1 tablet (800 mg total) by mouth 3 (three) times daily.    Dispense:  20 tablet    Refill:  2  . meloxicam (MOBIC) 7.5 MG tablet    Sig: Take 2 tablets (15 mg total) by mouth daily. Take up to 2 weeks.  Start after completing prednisone dose pack.    Dispense:  30 tablet    Refill:  2      Procedures: No procedures performed   Clinical Data: No additional findings.   Subjective: Chief Complaint  Patient presents with  . Lower Back - Pain    Dustin Rice is the uncle of Dustin Rice who comes in for evaluation of low back pain and bilateral leg pain worse on the right for about a week.  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  He denies any bowel or bladder dysfunction or numbness and tingling.  Back extension makes the pain worse.  He has been taking NSAIDs for the pain.  Denies any groin pain.  Heat does help.   Review of Systems  Constitutional: Negative.   All other systems reviewed and are  negative.    Objective: Vital Signs: There were no vitals taken for this visit.  Physical Exam Vitals and nursing note reviewed.  Constitutional:      Appearance: He is well-developed.  Pulmonary:     Effort: Pulmonary effort is normal.  Abdominal:     Palpations: Abdomen is soft.  Skin:    General: Skin is warm.  Neurological:     Mental Status: He is alert and oriented to person, place, and time.  Psychiatric:        Behavior: Behavior normal.        Thought Content: Thought content normal.        Judgment: Judgment normal.     Ortho Exam Lumbar spine shows relatively preserved range of motion.  Mild tenderness to the lumbar spine.  No focal motor or sensory deficits.  Normal reflexes.  Normal sensation distally. Specialty Comments:  No specialty comments available.  Imaging: XR Lumbar Spine 2-3 Views  Result Date: 07/02/2020 Well-preserved lumbar lordosis and intervertebral disc spaces.  Moderate to severe lumbar spondylosis    PMFS History: Patient Active Problem List   Diagnosis Date Noted  .  Palpitations 05/12/2016   Past Medical History:  Diagnosis Date  . Allergic rhinitis   . Cardiovascular risk factor 08/2013   10 YEARS  . Cervical spine disease   . Dyspnea   . Esophageal reflux   . GERD (gastroesophageal reflux disease)   . Gilbert's syndrome   . Hypercholesterolemia   . Hyperlipidemia   . Inguinal hernia    RIGHT  . Palpitations   . Prostatitis   . RBBB   . Shingles 03/2014    Family History  Problem Relation Age of Onset  . CAD Father   . Heart failure Father   . Hypertension Brother     Past Surgical History:  Procedure Laterality Date  . HERNIA REPAIR  2004   Social History   Occupational History  . Not on file  Tobacco Use  . Smoking status: Never Smoker  . Smokeless tobacco: Never Used  Substance and Sexual Activity  . Alcohol use: No  . Drug use: No  . Sexual activity: Not on file

## 2020-07-07 ENCOUNTER — Ambulatory Visit: Payer: BC Managed Care – PPO | Admitting: Psychiatry

## 2020-07-07 ENCOUNTER — Other Ambulatory Visit: Payer: Self-pay

## 2020-07-07 DIAGNOSIS — F4323 Adjustment disorder with mixed anxiety and depressed mood: Secondary | ICD-10-CM
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
## 2020-07-21 DIAGNOSIS — H2513 Age-related nuclear cataract, bilateral: Secondary | ICD-10-CM | POA: Diagnosis not present

## 2020-07-21 DIAGNOSIS — H16142 Punctate keratitis, left eye: Secondary | ICD-10-CM | POA: Diagnosis not present

## 2020-07-21 DIAGNOSIS — H179 Unspecified corneal scar and opacity: Secondary | ICD-10-CM | POA: Diagnosis not present

## 2020-07-21 DIAGNOSIS — H16302 Unspecified interstitial keratitis, left eye: Secondary | ICD-10-CM | POA: Diagnosis not present

## 2020-07-24 ENCOUNTER — Ambulatory Visit (INDEPENDENT_AMBULATORY_CARE_PROVIDER_SITE_OTHER): Payer: BC Managed Care – PPO | Admitting: Psychiatry

## 2020-07-24 ENCOUNTER — Other Ambulatory Visit: Payer: Self-pay

## 2020-07-24 DIAGNOSIS — F4323 Adjustment disorder with mixed anxiety and depressed mood: Secondary | ICD-10-CM

## 2020-07-24 NOTE — Progress Notes (Signed)
      [facility] Counselor/Therapist Progress Note  Patient ID: Dustin Rice, MRN: [id number],    Date: 07/24/2020  Time Spent:  60 minutes    1:05pm to 2:05pm  Treatment Type: Individual Therapy  Reported Symptoms: anxiety, some depression, concerns re: college-aged son and wife's recent diagnosis  Mental Status Exam:  Appearance:   Casual     Behavior:  Appropriate, Sharing and Motivated  Motor:  Normal  Speech/Language:   Clear and Coherent  Affect:  anxiety  Mood:  anxious  Thought process:  normal  Thought content:    WNL  Sensory/Perceptual disturbances:    WNL  Orientation:  oriented to person, place, time/date, situation, day of week, month of year and year  Attention:  Good  Concentration:  Good  Memory:  WNL  Fund of knowledge:   Good  Insight:    Good  Judgment:   Good  Impulse Control:  Good   Risk Assessment: Danger to Self:  No Self-injurious Behavior: No Danger to Others: No Duty to Warn:no Physical Aggression / Violence:No  Access to Firearms a concern: No  Gang Involvement:No   Subjective: Patient reports anxiety and some depression.  Frustrated with wife's bipolar disorder. Concerns for college-age son.  Interventions: Solution-Oriented/Positive Psychology and Ego-Supportive  Diagnosis:   ICD-10-CM   1. Adjustment disorder with mixed anxiety and depressed mood  F43.23     Plan of Care:  Patient not signing treatment plan on computer screen due to COVID.  Treatment Goals: Goals will remain on treatment plan as patient works with strategies to achieve his goals.  Progress will be noted each session and documented in the "progress" section of note.  Long term goal: Reduce overall level, frequency, and intensity of anxiety and depression so that daily functioning is not impaired.  Short term goal: Increase understanding of beliefs and messages that produce worry and anxiety.  Strategies: Identify and use specific coping strategies  for anxiety and depression reduction.  Also explore alternative ways of communicating better with spouse and being more sensitive to needs within the family, including both wife and son.  Progress: Patient in today anxious especially about some arthritic back pain he is having and his wife's recent bipolar diagnosis.  Patient concerned and frustrated not knowing what to do to help wife. Needed session today to process a lot of his anxieties and uncertainties, concerning his wife, their relationship, and how her illness impacts him.  He does feel confident her doctors and is anxious to see her progress more as she has had some ups and downs with her bipolar illness.  She is a more recently diagnosed patient with bipolar and it is affecting some of their communication and interactions at home.  Patient was able to vent a lot of feelings and get support as well as encouragement for his wife in their relationship.  Also more understanding that with bipolar disorder, often the patient and doctor have to work together in determining which medication seemed to help patient the most in becoming stabilized.  Encouraged patient in his support of his wife and as he tries to interact and communicate in ways that are helpful to their relationship, and also in better care of himself.  Goal review and progress/challenges noted with patient.  Next appointment within 2 to 3 weeks.   Mathis Fare, LCSW

## 2020-08-05 ENCOUNTER — Other Ambulatory Visit: Payer: Self-pay

## 2020-08-05 ENCOUNTER — Telehealth: Payer: Self-pay | Admitting: Physician Assistant

## 2020-08-05 DIAGNOSIS — M5441 Lumbago with sciatica, right side: Secondary | ICD-10-CM

## 2020-08-05 DIAGNOSIS — M5442 Lumbago with sciatica, left side: Secondary | ICD-10-CM

## 2020-08-05 NOTE — Telephone Encounter (Signed)
Patient is still having lumbar symptoms.  Can you order an OPEN MRI of the lumbar spine?

## 2020-08-07 ENCOUNTER — Ambulatory Visit (INDEPENDENT_AMBULATORY_CARE_PROVIDER_SITE_OTHER): Payer: BC Managed Care – PPO | Admitting: Psychiatry

## 2020-08-07 ENCOUNTER — Other Ambulatory Visit: Payer: Self-pay

## 2020-08-07 DIAGNOSIS — F4323 Adjustment disorder with mixed anxiety and depressed mood: Secondary | ICD-10-CM

## 2020-08-07 NOTE — Progress Notes (Signed)
[facility] Counselor/Therapist Progress Note  Patient ID: Dustin Rice, MRN: [id number],    Date: 08/07/2020  Time Spent: 60 minutes    8:00am to 9:00am  Treatment Type: Individual Therapy  Reported Symptoms: anxiety, some depression, stressors include wife's recent diagnosis and issues with college-age son, while also assisting 62 yr old mother.  Mental Status Exam:  Appearance:   Casual     Behavior:  Appropriate, Sharing and Motivated  Motor:  Normal  Speech/Language:   Clear and Coherent  Affect:  anxious  Mood:  anxious  Thought process:  normal  Thought content:    WNL  Sensory/Perceptual disturbances:    WNL  Orientation:  oriented to person, place, time/date, situation, day of week, month of year and year  Attention:  Good  Concentration:  Fair  Memory:  WNL  Fund of knowledge:   Good  Insight:    Good  Judgment:   Good  Impulse Control:  Fair   Risk Assessment: Danger to Self:  No Self-injurious Behavior: No Danger to Others: No Duty to Warn:no Physical Aggression / Violence:No  Access to Firearms a concern: No  Gang Involvement:No   Subjective: Patient today reports anxiety, some depression dealing mostly with issues with wife and son, and some work related concerns. Is support person for 78 yr old mother who lives locally.   Interventions: Solution-Oriented/Positive Psychology and Ego-Supportive  Diagnosis:   ICD-10-CM   1. Adjustment disorder with mixed anxiety and depressed mood  F43.23     Plan of Care: Patient not signing treatment plan on computer screen due to COVID.  Treatment Goals: Goals will remain on treatment plan as patient works with strategies to achieve his goals. Progress will be noted each session and documented in the "progress" section of note.  Long term goal: Reduce overall level, frequency, and intensity of anxiety and depression so that daily functioning is not impaired.  Short term goal: Increase  understanding of beliefs and messages that produce worry and anxiety.  Strategies: Identify and use specific coping strategies for anxiety and depression reduction. Also explore alternative ways of communicating better with spouse and being more sensitive to needs within the family, including both wife and son.  Progress: Patient today reporting anxiety, some depression, and continued stress with wife's diagnosis and college-age son's issues. Son has now been also diagnosed with ADHD and is on new meds.  Patient feels he is struggling and having difficulty with decisions, looks to patient for support and as sounding board.  Patient feeling stress of son and wife. 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 Has recently spoken with a friend and is to start walking some with him as he is able (due to some recent low-back pain).  Processed with patient ways that he can best support and interact with wife and with college-age son, 37 yr old mother,  as well as how patient can have better self-care emotionally and physically.  Very receptive to suggestions in helping his frustrations and how to best help family members, but also understanding he "can't fix everything for them." Over all, not quite as anxious today as las visit.  Reviewed some of the communication strategies we discussed last session, adding a couple more suggestions regarding active listening.  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  Goal review and progress/challenges noted with patient.  Next appointment within 2 to 3 weeks.   Mathis Fare, LCSW

## 2020-08-22 ENCOUNTER — Other Ambulatory Visit: Payer: Self-pay | Admitting: Orthopaedic Surgery

## 2020-08-22 ENCOUNTER — Other Ambulatory Visit: Payer: Self-pay | Admitting: Physician Assistant

## 2020-08-22 MED ORDER — DIAZEPAM 2 MG PO TABS: ORAL_TABLET | ORAL | 0 refills | Status: DC

## 2020-08-22 MED ORDER — MELOXICAM 7.5 MG PO TABS: 7.5000 mg | ORAL_TABLET | Freq: Every day | ORAL | 2 refills | Status: DC | PRN

## 2020-08-23 NOTE — Telephone Encounter (Signed)
Sent in yesterday.

## 2020-08-25 NOTE — Telephone Encounter (Signed)
Please advise. Thanks.  

## 2020-08-26 ENCOUNTER — Ambulatory Visit (INDEPENDENT_AMBULATORY_CARE_PROVIDER_SITE_OTHER): Payer: BC Managed Care – PPO | Admitting: Psychiatry

## 2020-08-26 ENCOUNTER — Other Ambulatory Visit: Payer: Self-pay

## 2020-08-26 DIAGNOSIS — F4323 Adjustment disorder with mixed anxiety and depressed mood: Secondary | ICD-10-CM

## 2020-08-26 NOTE — Progress Notes (Signed)
[facility] Counselor/Therapist Progress Note  Patient ID: Dustin Rice, MRN: [id number],    Date: 08/26/2020  Time Spent: 60 minutes    3:00pm to 4:00pm  Treatment Type: Individual Therapy  Reported Symptoms: anxiety, depression; having some back issues and to have MRI this weekend  Mental Status Exam:  Appearance:   Casual     Behavior:  Appropriate, Sharing and Motivated  Motor:  Normal  Speech/Language:   Clear and Coherent  Affect:  anxious  Mood:  anxious and depressed  Thought process:  goal directed  Thought content:    WNL  Sensory/Perceptual disturbances:    WNL  Orientation:  oriented to person, place, time/date, situation, day of week, month of year and year  Attention:  Good  Concentration:  Good  Memory:  WNL  Fund of knowledge:   Good  Insight:    Good  Judgment:   Good  Impulse Control:  Good   Risk Assessment: Danger to Self:  No Self-injurious Behavior: No Danger to Others: No Duty to Warn:no Physical Aggression / Violence:No  Access to Firearms a concern: No  Gang Involvement:No   Subjective: Patient today reporting anxiety and some depression. Having some back issues and to have MRI this weekend.  Interventions: Cognitive Behavioral Therapy and Solution-Oriented/Positive Psychology  Diagnosis:   ICD-10-CM   1. Adjustment disorder with mixed anxiety and depressed mood  F43.23     Plan of Care: Patient not signing treatment plan on computer screen due to COVID.  Treatment Goals: Goals will remain on treatment plan as patient works with strategies to achieve his goals. Progress will be noted each session and documented in the "progress" section of note.  Long term goal: Reduce overall level, frequency, and intensity of anxiety and depression so that daily functioning is not impaired.  Short term goal: Increase understanding of beliefs and messages that produce worry and anxiety.  Strategies: Identify and use specific  coping strategies for anxiety and depression reduction. Also explore alternative ways of communicating better with spouse and being more sensitive to needs within the family, including both wife and son.  Progress: Patient today reporting anxiety and some depression.  Also having back pain and having MRI this weekend. Continued stress and frustration with wife who is diagnosed with biploar disorder, and sometimes has difficulty with rage, rapid/pressured speech, and irritability.  Husband (patient)  wanting to best support her rather than make situation worse." Shared several situations recently with wife where her behavior was difficult to respond to and patient wasn't sure how best to respond to wife.  States he has used some of the strategies with wife and seemed to help, "but I need to keep working at it." Gave example of a recent situation where it was difficult to diffuse wife and finally able to get her to focus more on positives verus negative which successfully helped calm wife down more and helped patient in his interactions with wife. Feeling some encouraged about his college-age son and the emotional issues with which he has been working on with his therapist.  Before session ended, we reviewed strategies he has used to better support his wife especially when she is experiencing more instability with her bipolar disorder.  He does seem to be gaining more insight into his wife's condition as well as experience and seeing what seems to help versus not help.  Also discussed today how he might talk with her more about what she finds most helpful during  her more distressing times, particularly asking her at a time when she is relatively stable and might be able to give more helpful information as to what she feels helps the most when she is struggling.  Just their communication about all of this seems to be helping their relationship some.  Patient concerned about some low back pain he has been having  and is scheduled for an MRI this weekend.  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  Specially encouraged consistent positive self talk, and maintaining contacts with friends who are supportive of him.    Goal review and progress/challenges noted with patient.  Next appointment within 2 weeks.   Mathis Fare, LCSW

## 2020-08-30 ENCOUNTER — Other Ambulatory Visit: Payer: BC Managed Care – PPO

## 2020-09-04 ENCOUNTER — Ambulatory Visit: Payer: BC Managed Care – PPO | Admitting: Orthopaedic Surgery

## 2020-09-10 ENCOUNTER — Other Ambulatory Visit: Payer: Self-pay

## 2020-09-10 ENCOUNTER — Ambulatory Visit (INDEPENDENT_AMBULATORY_CARE_PROVIDER_SITE_OTHER): Payer: BC Managed Care – PPO | Admitting: Psychiatry

## 2020-09-10 DIAGNOSIS — F4323 Adjustment disorder with mixed anxiety and depressed mood: Secondary | ICD-10-CM

## 2020-09-10 NOTE — Progress Notes (Signed)
[facility] Counselor/Therapist Progress Note  Patient ID: Dustin Rice, MRN: [id number],    Date: 09/10/2020  Time Spent: 60 minutes   5:00pm to 6:00pm  Treatment Type: Individual Therapy  Reported Symptoms: anxiety, depression improving  Mental Status Exam:  Appearance:   Casual     Behavior:  Appropriate, Sharing and Motivated  Motor:  Normal  Speech/Language:   Clear and Coherent  Affect:  anxious, some depression  Mood:  anxious and mild depression  Thought process:  goal directed  Thought content:    WNL  Sensory/Perceptual disturbances:    WNL  Orientation:  oriented to person, place, time/date, situation, day of week, month of year and year  Attention:  Good  Concentration:  Good  Memory:  WNL  Fund of knowledge:   Good  Insight:    Good  Judgment:   Good  Impulse Control:  Good   Risk Assessment: Danger to Self:  No Self-injurious Behavior: No Danger to Others: No Duty to Warn:no Physical Aggression / Violence:No  Access to Firearms a concern: No  Gang Involvement:No   Subjective: Patient in today reporting anxiety and depression improving.  Interventions: Cognitive Behavioral Therapy and Solution-Oriented/Positive Psychology  Diagnosis:   ICD-10-CM   1. Adjustment disorder with mixed anxiety and depressed mood  F43.23     Plan of Care: Patient not signing treatment plan on computer screen due to COVID.  Treatment Goals: Goals will remain on treatment plan as patient works with strategies to achieve his goals. Progress will be noted each session and documented in the "progress" section of note.  Long term goal: Reduce overall level, frequency, and intensity of anxiety and depression so that daily functioning is not impaired.  Short term goal: Increase understanding of beliefs and messages that produce worry and anxiety.  Strategies: Identify and use specific coping strategies for anxiety and depression reduction. Also explore  alternative ways of communicating better with spouse and being more sensitive to needs within the family, including both wife and son.  Progress: Patient today reporting anxiety primarily, Depression decreased.  His MRI for lower back pain had to be rescheduled due to inclement weather and he will have that within the next couple weeks.  Family stressors contributing to anxiety, along with stress with lady which he his to power of attorney. Very stressed over personal situation with power of attorney situation.  Needed session today to vent and discuss family stressors and the situation involving him as power of attorney. Very sensitive information involved and not all details included in this note due to patient privacy needs.  Patient did seem to feel more grounded by session and and was able to discuss some strategies for managing his own stress about situations involved and discussed today.  Per his treatment goal plan above, we focused on short-term goals and strategies helping him to better manage stress and anxiety, and make more sound decisions.  Does feel his wife is showing some improvement as he had been very concerned about her with her bipolar diagnosis.  Shared some of her medication is being adjusted and is hoping that will help her even more.  Communication with wife continues to seem better.  Encouraged patient in practicing positive self-care for himself as he supports his wife with her issues and his college-age son who is having some emotional concerns, his 37 year old mother, and a 71 year old elderly friend who they help out at times.  Patient is encouraged to continue contact with  friendships that are supportive, practicing overall good self-care and positive self talk.  Goal review and progress/challenges noted with patient.  Next appointment within 2 to 3 weeks.   Mathis Fare, LCSW

## 2020-09-12 DIAGNOSIS — M47816 Spondylosis without myelopathy or radiculopathy, lumbar region: Secondary | ICD-10-CM | POA: Diagnosis not present

## 2020-09-12 DIAGNOSIS — M48061 Spinal stenosis, lumbar region without neurogenic claudication: Secondary | ICD-10-CM | POA: Diagnosis not present

## 2020-09-12 DIAGNOSIS — M5126 Other intervertebral disc displacement, lumbar region: Secondary | ICD-10-CM | POA: Diagnosis not present

## 2020-09-12 DIAGNOSIS — M4316 Spondylolisthesis, lumbar region: Secondary | ICD-10-CM | POA: Diagnosis not present

## 2020-09-17 ENCOUNTER — Ambulatory Visit: Payer: BC Managed Care – PPO | Admitting: Orthopaedic Surgery

## 2020-09-17 ENCOUNTER — Encounter: Payer: Self-pay | Admitting: Orthopaedic Surgery

## 2020-09-17 DIAGNOSIS — M5441 Lumbago with sciatica, right side: Secondary | ICD-10-CM | POA: Diagnosis not present

## 2020-09-17 DIAGNOSIS — M5442 Lumbago with sciatica, left side: Secondary | ICD-10-CM

## 2020-09-17 NOTE — Progress Notes (Signed)
   Office Visit Note   Patient: Dustin Rice           Date of Birth: 27-Sep-1955           MRN: [id number] Visit Date: 09/17/2020              Requested by: Merlene Laughter, MD [address] [place],  [place] [address] PCP: Merlene Laughter, MD   Assessment & Plan: Visit Diagnoses:  1. Acute bilateral low back pain with bilateral sciatica     Plan: MRI reviewed with the patient which shows multifactorial moderate spinal stenosis.  Recommendation has been made for lumbar spine ESI with either Dr. Alvester Morin or [facility] imaging.  Patient will follow-up as needed.  Follow-Up Instructions: Return if symptoms worsen or fail to improve.   Orders:  Orders Placed This Encounter  Procedures  . Ambulatory referral to Physical Medicine Rehab   No orders of the defined types were placed in this encounter.     Procedures: No procedures performed   Clinical Data: No additional findings.   Subjective: Chief Complaint  Patient presents with  . Lower Back - Pain    Dustin Rice returns today for MRI review of the lumbar spine.  Denies any changes.   Review of Systems  Constitutional: Negative.   All other systems reviewed and are negative.    Objective: Vital Signs: There were no vitals taken for this visit.  Physical Exam Vitals and nursing note reviewed.  Constitutional:      Appearance: He is well-developed and well-nourished.  Pulmonary:     Effort: Pulmonary effort is normal.  Abdominal:     Palpations: Abdomen is soft.  Skin:    General: Skin is warm.  Neurological:     Mental Status: He is alert and oriented to person, place, and time.  Psychiatric:        Mood and Affect: Mood and affect normal.        Behavior: Behavior normal.        Thought Content: Thought content normal.        Judgment: Judgment normal.     Ortho Exam Exam is unchanged.  No focal motor or sensory deficits. Specialty Comments:  No specialty comments  available.  Imaging: No results found.   PMFS History: Patient Active Problem List   Diagnosis Date Noted  . Palpitations 05/12/2016   Past Medical History:  Diagnosis Date  . Allergic rhinitis   . Cardiovascular risk factor 08/2013   10 YEARS  . Cervical spine disease   . Dyspnea   . Esophageal reflux   . GERD (gastroesophageal reflux disease)   . Gilbert's syndrome   . Hypercholesterolemia   . Hyperlipidemia   . Inguinal hernia    RIGHT  . Palpitations   . Prostatitis   . RBBB   . Shingles 03/2014    Family History  Problem Relation Age of Onset  . CAD Father   . Heart failure Father   . Hypertension Brother     Past Surgical History:  Procedure Laterality Date  . HERNIA REPAIR  2004   Social History   Occupational History  . Not on file  Tobacco Use  . Smoking status: Never Smoker  . Smokeless tobacco: Never Used  Substance and Sexual Activity  . Alcohol use: No  . Drug use: No  . Sexual activity: Not on file

## 2020-09-18 ENCOUNTER — Other Ambulatory Visit: Payer: BC Managed Care – PPO

## 2020-09-22 ENCOUNTER — Ambulatory Visit (INDEPENDENT_AMBULATORY_CARE_PROVIDER_SITE_OTHER): Payer: BC Managed Care – PPO | Admitting: Psychiatry

## 2020-09-22 ENCOUNTER — Other Ambulatory Visit: Payer: Self-pay

## 2020-09-22 DIAGNOSIS — F411 Generalized anxiety disorder: Secondary | ICD-10-CM | POA: Diagnosis not present

## 2020-09-22 NOTE — Progress Notes (Signed)
[facility] Counselor/Therapist Progress Note  Patient ID: Dustin Rice, MRN: [id number],    Date: 09/22/2020  Time Spent: 50 minutes   5:10pm to 6:00pm  Treatment Type: Individual Therapy  Reported Symptoms: Anxiety, stressed  Mental Status Exam:  Appearance:   Casual     Behavior:  Appropriate, Sharing and Motivated  Motor:  Normal  Speech/Language:   Clear and Coherent  Affect:  anxious  Mood:  anxious  Thought process:  goal directed  Thought content:    WNL  Sensory/Perceptual disturbances:    WNL  Orientation:  oriented to person, place, time/date, situation, day of week, month of year and year  Attention:  Good  Concentration:  Good  Memory:  WNL  Fund of knowledge:   Good  Insight:    Good  Judgment:   Good  Impulse Control:  Good   Risk Assessment: Danger to Self:  No Self-injurious Behavior: No Danger to Others: No Duty to Warn:no Physical Aggression / Violence:No  Access to Firearms a concern: No  Gang Involvement:No   Subjective: Patient today reporting anxiety and stressed re: personal, 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   Interventions: Cognitive Behavioral Therapy and Solution-Oriented/Positive Psychology  Diagnosis:   ICD-10-CM   1. Generalized anxiety disorder  F41.1     Plan of Care: Patient not signing treatment plan on computer screen due to COVID.  Treatment Goals: Goals will remain on treatment plan as patient works with strategies to achieve his goals. Progress will be noted each session and documented in the "progress" section of note.  Long term goal: Reduce overall level, frequency, and intensity of anxiety and depression so that daily functioning is not impaired.  Short term goal: Increase understanding of beliefs and messages that produce worry and anxiety.  Strategies: Identify and use specific coping strategies for anxiety and depression reduction. Also explore alternative ways of  communicating better with spouse and being more sensitive to needs within the family, including both wife and son.  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  Lower back MRI showed arthritis, 2 bulging discs, and some stenosis per patient report. Very concerned about his wife who is having multiple health problems including diabetes worsening and depression.He has difficult time knowing how to best help support wife and it can be draining. Also concerned about adult college age son and his mental health issues.  Son is on meds and in therapy.   Communication with wife same to be better.  Worked more again today with treatment goal strategies for better managing his stress and make good decisions regarding personal, family, and work related issues.  Patient did a good job today talking through multiple stressful issues that he is dealing with currently and felt this to be very helpful for him.  Encouraged patient to take breaks throughout the day as he can as a part of helping manage his stress, and to continue contacts with friendships that are supportive, practicing overall good self-care and positive self talk, while also supporting his wife and family with specific issues.  (Not all details included in this note due to patient privacy needs.)  Goal review and progress/challenges noted with patient.  Next appointment within 2 to 3 weeks.   Mathis Fare, LCSW

## 2020-10-02 ENCOUNTER — Other Ambulatory Visit: Payer: Self-pay

## 2020-10-02 ENCOUNTER — Encounter: Payer: Self-pay | Admitting: Physical Medicine and Rehabilitation

## 2020-10-02 ENCOUNTER — Ambulatory Visit (INDEPENDENT_AMBULATORY_CARE_PROVIDER_SITE_OTHER): Payer: BC Managed Care – PPO | Admitting: Physical Medicine and Rehabilitation

## 2020-10-02 ENCOUNTER — Ambulatory Visit: Payer: Self-pay

## 2020-10-02 VITALS — BP 124/82 | HR 75

## 2020-10-02 DIAGNOSIS — M5416 Radiculopathy, lumbar region: Secondary | ICD-10-CM

## 2020-10-02 MED ORDER — METHYLPREDNISOLONE ACETATE 80 MG/ML IJ SUSP
80.0000 mg | Freq: Once | INTRAMUSCULAR | Status: AC
Start: 2020-10-02 — End: 2020-10-02
  Administered 2020-10-02: 80 mg

## 2020-10-02 NOTE — Progress Notes (Signed)
Pt state lower back pain mostly on the right side down to both legs. Pt state he has pain in his right groin. Pt state when getting out of bed or twisting and turning makes the pain worse. Pt state he takes pain meds and use tens unit to help ease the pain.  Numeric Pain Rating Scale and Functional Assessment Average Pain 4   In the last MONTH (on 0-10 scale) has pain interfered with the following?  1. General activity like being  able to carry out your everyday physical activities such as walking, climbing stairs, carrying groceries, or moving a chair?  Rating(8)   +Driver, -BT, -Dye Allergies.

## 2020-10-02 NOTE — Procedures (Signed)
Lumbosacral Transforaminal Epidural Steroid Injection - Sub-Pedicular Approach with Fluoroscopic Guidance  Patient: Dustin Rice      Date of Birth: 03-Dec-1955 MRN: [id number] PCP: Merlene Laughter, MD      Visit Date: 10/02/2020   Universal Protocol:    Date/Time: 10/02/2020  Consent Given By: the patient  Position: PRONE  Additional Comments: Vital signs were monitored before and after the procedure. Patient was prepped and draped in the usual sterile fashion. The correct patient, procedure, and site was verified.   Injection Procedure Details:   Procedure diagnoses: Lumbar radiculopathy [M54.16]    Meds Administered:  Meds ordered this encounter  Medications  . methylPREDNISolone acetate (DEPO-MEDROL) injection 80 mg    Laterality: Bilateral  Location/Site:  L4-L5  Needle:5.0 in., 22 ga.  Short bevel or Quincke spinal needle  Needle Placement: Transforaminal  Findings:    -Comments: Excellent flow of contrast along the nerve, nerve root and into the epidural space.  Procedure Details: After squaring off the end-plates to get a true AP view, the C-arm was positioned so that an oblique view of the foramen as noted above was visualized. The target area is just inferior to the "nose of the scotty dog" or sub pedicular. The soft tissues overlying this structure were infiltrated with 2-3 ml. of 1% Lidocaine without Epinephrine.  The spinal needle was inserted toward the target using a "trajectory" view along the fluoroscope beam.  Under AP and lateral visualization, the needle was advanced so it did not puncture dura and was located close the 6 O'Clock position of the pedical in AP tracterory. Biplanar projections were used to confirm position. Aspiration was confirmed to be negative for CSF and/or blood. A 1-2 ml. volume of Isovue-250 was injected and flow of contrast was noted at each level. Radiographs were obtained for documentation purposes.   After attaining the  desired flow of contrast documented above, a 0.5 to 1.0 ml test dose of 0.25% Marcaine was injected into each respective transforaminal space.  The patient was observed for 90 seconds post injection.  After no sensory deficits were reported, and normal lower extremity motor function was noted,   the above injectate was administered so that equal amounts of the injectate were placed at each foramen (level) into the transforaminal epidural space.   Additional Comments:  The patient tolerated the procedure well Dressing: 2 x 2 sterile gauze and Band-Aid    Post-procedure details: Patient was observed during the procedure. Post-procedure instructions were reviewed.  Patient left the clinic in stable condition.

## 2020-10-02 NOTE — Progress Notes (Signed)
Dustin Rice - 65 y.o. male MRN [id number]  Date of birth: 06/12/1956  Office Visit Note: Visit Date: 10/02/2020 PCP: Merlene Laughter, MD Referred by: Merlene Laughter, MD  Subjective: Chief Complaint  Patient presents with  . Lower Back - Pain  . Left Leg - Pain  . Right Leg - Pain   HPI:  Dustin Rice is a 65 y.o. male who comes in today at the request of Dr. Glee Arvin for planned Bilateral L4-L5 Lumbar epidural steroid injection with fluoroscopic guidance.  The patient has failed conservative care including home exercise, medications, time and activity modification.  This injection will be diagnostic and hopefully therapeutic.  Please see requesting physician notes for further details and justification. MRI reviewed with images and spine model.  MRI reviewed in the note below.    ROS Otherwise per HPI.  Assessment & Plan: Visit Diagnoses:    ICD-10-CM   1. Lumbar radiculopathy  M54.16 XR C-ARM NO REPORT    Epidural Steroid injection    methylPREDNISolone acetate (DEPO-MEDROL) injection 80 mg    Plan: No additional findings.   Meds & Orders:  Meds ordered this encounter  Medications  . methylPREDNISolone acetate (DEPO-MEDROL) injection 80 mg    Orders Placed This Encounter  Procedures  . XR C-ARM NO REPORT  . Epidural Steroid injection    Follow-up: Return if symptoms worsen or fail to improve.   Procedures: No procedures performed  Lumbosacral Transforaminal Epidural Steroid Injection - Sub-Pedicular Approach with Fluoroscopic Guidance  Patient: Dustin Rice      Date of Birth: 01-21-1956 MRN: [id number] PCP: Merlene Laughter, MD      Visit Date: 10/02/2020   Universal Protocol:    Date/Time: 10/02/2020  Consent Given By: the patient  Position: PRONE  Additional Comments: Vital signs were monitored before and after the procedure. Patient was prepped and draped in the usual sterile fashion. The correct patient, procedure, and site was  verified.   Injection Procedure Details:   Procedure diagnoses: Lumbar radiculopathy [M54.16]    Meds Administered:  Meds ordered this encounter  Medications  . methylPREDNISolone acetate (DEPO-MEDROL) injection 80 mg    Laterality: Bilateral  Location/Site:  L4-L5  Needle:5.0 in., 22 ga.  Short bevel or Quincke spinal needle  Needle Placement: Transforaminal  Findings:    -Comments: Excellent flow of contrast along the nerve, nerve root and into the epidural space.  Procedure Details: After squaring off the end-plates to get a true AP view, the C-arm was positioned so that an oblique view of the foramen as noted above was visualized. The target area is just inferior to the "nose of the scotty dog" or sub pedicular. The soft tissues overlying this structure were infiltrated with 2-3 ml. of 1% Lidocaine without Epinephrine.  The spinal needle was inserted toward the target using a "trajectory" view along the fluoroscope beam.  Under AP and lateral visualization, the needle was advanced so it did not puncture dura and was located close the 6 O'Clock position of the pedical in AP tracterory. Biplanar projections were used to confirm position. Aspiration was confirmed to be negative for CSF and/or blood. A 1-2 ml. volume of Isovue-250 was injected and flow of contrast was noted at each level. Radiographs were obtained for documentation purposes.   After attaining the desired flow of contrast documented above, a 0.5 to 1.0 ml test dose of 0.25% Marcaine was injected into each respective transforaminal space.  The patient was observed for 90 seconds  post injection.  After no sensory deficits were reported, and normal lower extremity motor function was noted,   the above injectate was administered so that equal amounts of the injectate were placed at each foramen (level) into the transforaminal epidural space.   Additional Comments:  The patient tolerated the procedure well Dressing: 2 x  2 sterile gauze and Band-Aid    Post-procedure details: Patient was observed during the procedure. Post-procedure instructions were reviewed.  Patient left the clinic in stable condition.      Clinical History: Rachel Moulds, DO - 09/14/2020  Formatting of this note might be different from the original.  MRI lumbar spine:   TECHNIQUE: Sagittal and axial T1 and T2-weighted sequences were performed. Additional sagittal STIR images were performed.   INDICATION: Back pain   COMPARISON: None   FINDINGS:  # Grade 1 anterolisthesis L3-4 and L4-5.  # Vertebral body heights are well maintained.  # Small meningioma within L2 and L4.  # Conus terminates at T12-L1 without evidence of tethering.  # Nerve roots appear normal.  # Incidental findings: None.    # L1-2: Normal.  #   # L2-3: Normal.  #   # L3-4: Mild degenerative disc disease. Grade 1 anterolisthesis. There is facet arthropathy and disc bulge causing moderate central canal stenosis with mild bilateral neural foraminal narrowing..  #   # L4-5: Mild degenerative disc disease. Grade 1 anterolisthesis. Facet arthropathy and disc bulge causing moderate to severe central canal stenosis and mild bilateral neural foraminal narrowing.  #   # L5-S1: Facet arthropathy and disc bulge without significant central canal stenosis or neuroforaminal narrowing.    IMPRESSION:   There is facet arthropathy and disc bulge as well as grade 1 anterolisthesis causing moderate to severe central canal stenosis at the level of L4-5 and moderate canal stenosis at L3-4.   Electronically Signed by: Abner Greenspan on 09/14/2020 3:56 PM Exam End: 09/12/20 6:45 PM     Objective:  VS:  HT:    WT:   BMI:     BP:124/82  HR:75bpm  TEMP: ( )  RESP:  Physical Exam Vitals and nursing note reviewed.  Constitutional:      General: He is not in acute distress.    Appearance: Normal appearance. He is not ill-appearing.  HENT:      Head: Normocephalic and atraumatic.     Right Ear: External ear normal.     Left Ear: External ear normal.     Nose: No congestion.  Eyes:     Extraocular Movements: Extraocular movements intact.  Cardiovascular:     Rate and Rhythm: Normal rate.     Pulses: Normal pulses.  Pulmonary:     Effort: Pulmonary effort is normal. No respiratory distress.  Abdominal:     General: There is no distension.     Palpations: Abdomen is soft.  Musculoskeletal:        General: No tenderness or signs of injury.     Cervical back: Neck supple.     Right lower leg: No edema.     Left lower leg: No edema.     Comments: Patient has good distal strength without clonus.  Skin:    Findings: No erythema or rash.  Neurological:     General: No focal deficit present.     Mental Status: He is alert and oriented to person, place, and time.     Sensory: No sensory deficit.     Motor: No  weakness or abnormal muscle tone.     Coordination: Coordination normal.  Psychiatric:        Mood and Affect: Mood normal.        Behavior: Behavior normal.      Imaging: No results found.

## 2020-10-02 NOTE — Patient Instructions (Signed)

## 2020-10-07 ENCOUNTER — Ambulatory Visit (INDEPENDENT_AMBULATORY_CARE_PROVIDER_SITE_OTHER): Payer: BC Managed Care – PPO | Admitting: Psychiatry

## 2020-10-07 ENCOUNTER — Other Ambulatory Visit: Payer: Self-pay

## 2020-10-07 DIAGNOSIS — F411 Generalized anxiety disorder: Secondary | ICD-10-CM

## 2020-10-07 NOTE — Progress Notes (Signed)
      [facility] Counselor/Therapist Progress Note  Patient ID: Dustin Rice, MRN: [id number],    Date: 10/07/2020  Time Spent: 60 minutes     5:00pm to 6:00pm  Treatment Type: Individual Therapy  Reported Symptoms: anxiety   Mental Status Exam:  Appearance:   Casual     Behavior:  Appropriate, Sharing and Motivated  Motor:  Normal  Speech/Language:   Clear and Coherent  Affect:  anxious  Mood:  anxious  Thought process:  normal  Thought content:    WNL  Sensory/Perceptual disturbances:    WNL  Orientation:  oriented to person, place, time/date, situation, day of week, month of year and year  Attention:  Good  Concentration:  Good  Memory:  WNL  Fund of knowledge:   Good  Insight:    Good  Judgment:   Good  Impulse Control:  Good   Risk Assessment: Danger to Self:  No Self-injurious Behavior: No Danger to Others: No Duty to Warn:no Physical Aggression / Violence:No  Access to Firearms a concern: No  Gang Involvement:No   Subjective: Patient today reporting anxiety re: personal, work, and family.   Interventions: Cognitive Behavioral Therapy and Solution-Oriented/Positive Psychology  Diagnosis:   ICD-10-CM   1. Generalized anxiety disorder  F41.1      Plan of Care: Patient not signing treatment plan on computer screen due to COVID.  Treatment Goals: Goals will remain on treatment plan as patient works with strategies to achieve his goals. Progress will be noted each session and documented in the "progress" section of note.  Long term goal: Reduce overall level, frequency, and intensity of anxiety and depression so that daily functioning is not impaired.  Short term goal: Increase understanding of beliefs and messages that produce worry and anxiety.  Strategies: Identify and use specific coping strategies for anxiety and depression reduction. Also explore alternative ways of communicating better with spouse and being more sensitive to needs  within the family, including both wife and son.  Progress: Patient in today reporting anxiety re: personal, family, and work situations.  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 Increased stress and patient is using strategies discussed in prior sessions to manage his stress and anxiety. Had recent back injections that helped some temporarily but seems to be wearing off. Very concerned about wife's health issues including diabetes. Discussed their communication issues and worked with patient on some more helpful ways of communicating his concern for her and encouraging her in recommendations made by her doctor including dietary tips and exercise. Also concerned about his college-age son and his mental health issues. Worked further today on patient better managing his overall stress and making good decisions while supporting family members with their issues, reflecting on his treatment goal strategies to help with these concerns. Encouraged patient to also be very mindful of his own self-care and making it positive, to take breaks as needed throughout the day to help manage stress, to stay in touch with friends that are supportive, practice positive affirming self talk, and be proud of the strength he is showing as he works diligently to make progress on his goals.   Goal review and progress/challenges noted with patient  Next appointment within 2 to 3 weeks.    Mathis Fare, LCSW

## 2020-10-16 ENCOUNTER — Ambulatory Visit: Payer: BC Managed Care – PPO | Admitting: Adult Health

## 2020-10-30 ENCOUNTER — Ambulatory Visit: Payer: BC Managed Care – PPO | Admitting: Psychiatry

## 2020-10-31 DIAGNOSIS — M48061 Spinal stenosis, lumbar region without neurogenic claudication: Secondary | ICD-10-CM | POA: Diagnosis not present

## 2020-11-03 ENCOUNTER — Encounter: Payer: BC Managed Care – PPO | Admitting: Psychiatry

## 2020-11-03 NOTE — Progress Notes (Signed)
      [facility] Counselor/Therapist Progress Note  Patient ID: Dustin Rice, MRN: [id number],    Date: 11/03/2020  Time Spent:   Treatment Type: Individual Therapy  Reported Symptoms:  Mental Status Exam:  Appearance:   Casual     Behavior:  Appropriate, Sharing and Motivated  Motor:  Normal  Speech/Language:   Clear and Coherent  Affect:  anxious  Mood:  anxious  Thought process:  goal directed  Thought content:    WNL  Sensory/Perceptual disturbances:    WNL  Orientation:  oriented to person, place, time/date, situation, day of week, month of year and year  Attention:  Good  Concentration:  Good  Memory:  WNL  Fund of knowledge:   Good  Insight:    Good  Judgment:   Good  Impulse Control:  Good   Risk Assessment: Danger to Self:  No Self-injurious Behavior: No Danger to Others: No Duty to Warn:no Physical Aggression / Violence:No  Access to Firearms a concern: No  Gang Involvement:No   Subjective:   Interventions: Cognitive Behavioral Therapy and Solution-Oriented/Positive Psychology  Diagnosis:   ICD-10-CM   1. Generalized anxiety disorder  F41.1     Progress:   Mathis Fare, LCSW                   This encounter was created in error - please disregard.

## 2020-11-20 ENCOUNTER — Other Ambulatory Visit: Payer: Self-pay

## 2020-11-20 ENCOUNTER — Ambulatory Visit (INDEPENDENT_AMBULATORY_CARE_PROVIDER_SITE_OTHER): Payer: BC Managed Care – PPO | Admitting: Psychiatry

## 2020-11-20 DIAGNOSIS — F411 Generalized anxiety disorder: Secondary | ICD-10-CM | POA: Diagnosis not present

## 2020-11-20 NOTE — Progress Notes (Signed)
[facility] Counselor/Therapist Progress Note  Patient ID: Dustin Rice, MRN: [id number],    Date: 11/20/2020  Time Spent: 60 minutes    5:00pm to 6:00pm   Treatment Type: Individual Therapy  Reported Symptoms: anxiety, stressed   Mental Status Exam:  Appearance:   Casual     Behavior:  Appropriate, Sharing and Motivated  Motor:  Normal  Speech/Language:   Clear and Coherent  Affect:  anxious  Mood:  anxious  Thought process:  goal directed  Thought content:    WNL  Sensory/Perceptual disturbances:    WNL  Orientation:  oriented to person, place, time/date, situation, day of week, month of year and year  Attention:  Good  Concentration:  Good  Memory:  WNL  Fund of knowledge:   Good  Insight:    Good  Judgment:   Good  Impulse Control:  Good   Risk Assessment: Danger to Self:  No Self-injurious Behavior: No Danger to Others: No Duty to Warn:no Physical Aggression / Violence:No  Access to Firearms a concern: No  Gang Involvement:No   Subjective: Patient today reports anxiety and feeling stressed as main symptoms. Anxiety and stress mostly related to personal, health (back), and family. Trying to support college-age son with some concerns and has been doing some better more recently. ( See Progress Note below).  Interventions: Solution-Oriented/Positive Psychology and Ego-Supportive  Diagnosis:   ICD-10-CM   1. Generalized anxiety disorder  F41.1      Plan of Care: Patient not signing treatment plan on computer screen due to COVID.  Treatment Goals: Goals will remain on treatment plan as patient works with strategies to achieve his goals. Progress will be noted each session and documented in the "progress" section of note.  Long term goal: Reduce overall level, frequency, and intensity of anxiety and depression so that daily functioning is not impaired.  Short term goal: Increase understanding of beliefs and messages that produce worry and  anxiety.  Strategies: Identify and use specific coping strategies for anxiety and depression reduction. Also explore alternative ways of communicating better with spouse and being more sensitive to needs within the family, including both wife and son.  Progress: Patient in today reporting anxiety and feeling stressed over personal, health (back), and family concerns.  Wife having health issues as well as college-age son, and both are improving some.  Is having some recurring back issues as his previous injections were helpful for a while.  He is starting PT soon for that and then to see his neurologist once he has completed PT.  The pain from back issues can also impact him emotionally but he seems to be handling it pretty well at this point.  Communication with wife has improved some.  Reviewed stress management strategies from prior session to help him with current stressors as stated above.  He shared that coming to sessions and venting and processing a lot of his thoughts and feelings is also helpful and makes it feel more manageable.  Using specific examples, worked with patient on his anxious/depressive thoughts and how to interrupt them more quickly in order to replace with more reality-based and empowering thoughts that do not support anxiety and depression. Encouraged patient to continue using the strategy between sessions, practicing more positive self-care and affirming self talk, staying in touch with people that are supportive, taking breaks as needed throughout the day to help better manage stress and anxiety, and feeling good about the strength he is showing in  challenging circumstances as he tries to work with his therapy goals and move forward in a positive direction.   Review and progress/challenges noted with patient.  Next appointment within 2 to 3 weeks.   Mathis Fare, LCSW

## 2020-11-24 DIAGNOSIS — M5416 Radiculopathy, lumbar region: Secondary | ICD-10-CM | POA: Diagnosis not present

## 2020-11-24 DIAGNOSIS — M5116 Intervertebral disc disorders with radiculopathy, lumbar region: Secondary | ICD-10-CM | POA: Diagnosis not present

## 2020-11-27 DIAGNOSIS — M48061 Spinal stenosis, lumbar region without neurogenic claudication: Secondary | ICD-10-CM | POA: Diagnosis not present

## 2020-11-27 DIAGNOSIS — M545 Low back pain, unspecified: Secondary | ICD-10-CM | POA: Diagnosis not present

## 2020-12-08 DIAGNOSIS — M48061 Spinal stenosis, lumbar region without neurogenic claudication: Secondary | ICD-10-CM | POA: Diagnosis not present

## 2020-12-08 DIAGNOSIS — M545 Low back pain, unspecified: Secondary | ICD-10-CM | POA: Diagnosis not present

## 2020-12-18 ENCOUNTER — Ambulatory Visit (INDEPENDENT_AMBULATORY_CARE_PROVIDER_SITE_OTHER): Payer: BC Managed Care – PPO | Admitting: Psychiatry

## 2020-12-18 ENCOUNTER — Other Ambulatory Visit: Payer: Self-pay

## 2020-12-18 DIAGNOSIS — F411 Generalized anxiety disorder: Secondary | ICD-10-CM

## 2020-12-18 NOTE — Progress Notes (Signed)
[facility] Counselor/Therapist Progress Note  Patient ID: Dustin Rice, MRN: [id number],    Date: 12/18/2020  Time Spent:  60 minutes    5:00pm to 6:00pm  Treatment Type: Individual Therapy  Reported Symptoms: anxiety   Mental Status Exam:  Appearance:   Casual     Behavior:  Appropriate, Sharing and Motivated  Motor:  Normal  Speech/Language:   Clear and Coherent  Affect:  anxious  Mood:  anxious  Thought process:  goal directed  Thought content:    WNL  Sensory/Perceptual disturbances:    WNL  Orientation:  oriented to person, place, time/date, situation, day of week, month of year and year  Attention:  Good  Concentration:  Good  Memory:  WNL  Fund of knowledge:   Good  Insight:    Good  Judgment:   Good  Impulse Control:  Good   Risk Assessment: Danger to Self:  No Self-injurious Behavior: No Danger to Others: No Duty to Warn:no Physical Aggression / Violence:No  Access to Firearms a concern: No  Gang Involvement:No   Subjective:  Patient today reporting anxiety mostly related to personal, health, and family conerns.  Wife suffers from depression and he is her biggest support. Patient continues to struggle with some back issues and anxiety but is making progress after some recent injections and current PT.  Worked in session today using CBT and focusing on his anxiety, looking at specific thoughts that lead to frequent anxiety for patient.  Able to identify thoughts and see how they do lead to his feelings of anxiety.  Worked on more quickly identifying anxious thoughts and being able to interrupt them and replace with more reality-based thoughts that do not lead to anxiety.  Patient is to practice this more between sessions and is optimistic that his anxiety will decrease some more also as his wife improves and his health issues improve.  He does have good motivation and consistent follow through with suggestions.  Patient also acknowledges that his  communication with wife continues to improve gradually.  Feeling better and more encouraged by session end.    Interventions: Solution-Oriented/Positive Psychology and Ego-Supportive  Diagnosis:   ICD-10-CM   1. Generalized anxiety disorder  F41.1      Plan of Care: Patient not signing treatment plan on computer screen due to COVID.  Treatment Goals: Goals will remain on treatment plan as patient works with strategies to achieve his goals. Progress will be noted each session and documented in the "progress" section of note.  Long term goal: Reduce overall level, frequency, and intensity of anxiety and depression so that daily functioning is not impaired.  Short term goal: Increase understanding of beliefs and messages that produce worry and anxiety.  Strategies: Identify and use specific coping strategies for anxiety and depression reduction. Also explore alternative ways of communicating better with spouse and being more sensitive to needs within the family, including both wife and son.  Progress / Plan: Patient in today reporting anxiety which we worked on more today as noted in "subjective" section above.  Feels that venting and processing a lot of thoughts and feelings and his sessions are helpful and make things feel more manageable outside of sessions.  Encouraged patient to continue working with strategies and suggested changes in between sessions, staying in touch 40 of people, practicing more positive self talk, taking breaks as needed throughout the day, being patient with himself, and recognizing the strength he is showing in the midst  of challenging situations as he tries to move forward in a more positive direction through the use of therapy goals with specific goal-directed behaviors.  Goal review and progress/challenges noted with patient.  Next appointment within 3 to 4 weeks.   Mathis Fare, LCSW

## 2020-12-24 DIAGNOSIS — M545 Low back pain, unspecified: Secondary | ICD-10-CM | POA: Diagnosis not present

## 2020-12-24 DIAGNOSIS — M48061 Spinal stenosis, lumbar region without neurogenic claudication: Secondary | ICD-10-CM | POA: Diagnosis not present

## 2021-01-09 DIAGNOSIS — M48061 Spinal stenosis, lumbar region without neurogenic claudication: Secondary | ICD-10-CM | POA: Diagnosis not present

## 2021-01-09 DIAGNOSIS — M545 Low back pain, unspecified: Secondary | ICD-10-CM | POA: Diagnosis not present

## 2021-01-22 ENCOUNTER — Ambulatory Visit: Payer: BC Managed Care – PPO | Admitting: Psychiatry

## 2021-01-22 NOTE — Progress Notes (Deleted)
      [facility] Counselor/Therapist Progress Note  Patient ID: Dustin Rice, MRN: [id number],    Date: 01/22/2021  Time Spent:   Treatment Type: {CHL AMB THERAPY TYPES:[id number]}  Reported Symptoms:   Mental Status Exam:  Appearance:   {PSY:22683}     Behavior:  {PSY:21022743}  Motor:  {PSY:22302}  Speech/Language:   {PSY:22685}  Affect:  {PSY:22687}  Mood:  {PSY:31886}  Thought process:  {PSY:31888}  Thought content:    {PSY:[phone]}  Sensory/Perceptual disturbances:    {[phone]}  Orientation:  {PSY:30297}  Attention:  {PSY:22877}  Concentration:  {[phone]}  Memory:  {PSY:267-484-4336}  Fund of knowledge:   {[phone]}  Insight:    {[phone]}  Judgment:   {[phone]}  Impulse Control:  {[phone]}   Risk Assessment: Danger to Self:  {PSY:22692} Self-injurious Behavior: {PSY:22692} Danger to Others: {PSY:22692} Duty to Warn:{PSY:311194} Physical Aggression / Violence:{PSY:21197} Access to Firearms a concern: {PSY:21197} Gang Involvement:{PSY:21197}  Subjective:          Interventions: {[phone]}  Diagnosis:No diagnosis found.  Plan:         Mathis Fare, LCSW

## 2021-01-23 DIAGNOSIS — M545 Low back pain, unspecified: Secondary | ICD-10-CM | POA: Diagnosis not present

## 2021-01-23 DIAGNOSIS — M48061 Spinal stenosis, lumbar region without neurogenic claudication: Secondary | ICD-10-CM | POA: Diagnosis not present

## 2021-02-05 DIAGNOSIS — M545 Low back pain, unspecified: Secondary | ICD-10-CM | POA: Diagnosis not present

## 2021-02-05 DIAGNOSIS — M48061 Spinal stenosis, lumbar region without neurogenic claudication: Secondary | ICD-10-CM | POA: Diagnosis not present

## 2021-02-26 DIAGNOSIS — M48061 Spinal stenosis, lumbar region without neurogenic claudication: Secondary | ICD-10-CM | POA: Diagnosis not present

## 2021-02-26 DIAGNOSIS — M545 Low back pain, unspecified: Secondary | ICD-10-CM | POA: Diagnosis not present

## 2021-03-04 DIAGNOSIS — M545 Low back pain, unspecified: Secondary | ICD-10-CM | POA: Diagnosis not present

## 2021-03-04 DIAGNOSIS — M48061 Spinal stenosis, lumbar region without neurogenic claudication: Secondary | ICD-10-CM | POA: Diagnosis not present

## 2021-03-05 ENCOUNTER — Other Ambulatory Visit: Payer: Self-pay | Admitting: Physician Assistant

## 2021-03-05 DIAGNOSIS — M545 Low back pain, unspecified: Secondary | ICD-10-CM | POA: Diagnosis not present

## 2021-03-05 DIAGNOSIS — M48061 Spinal stenosis, lumbar region without neurogenic claudication: Secondary | ICD-10-CM | POA: Diagnosis not present

## 2021-03-05 MED ORDER — MELOXICAM 7.5 MG PO TABS: 7.5000 mg | ORAL_TABLET | Freq: Every day | ORAL | 2 refills | Status: DC | PRN

## 2021-03-10 DIAGNOSIS — M545 Low back pain, unspecified: Secondary | ICD-10-CM | POA: Diagnosis not present

## 2021-03-10 DIAGNOSIS — M48061 Spinal stenosis, lumbar region without neurogenic claudication: Secondary | ICD-10-CM | POA: Diagnosis not present

## 2021-03-12 DIAGNOSIS — M545 Low back pain, unspecified: Secondary | ICD-10-CM | POA: Diagnosis not present

## 2021-03-12 DIAGNOSIS — M48061 Spinal stenosis, lumbar region without neurogenic claudication: Secondary | ICD-10-CM | POA: Diagnosis not present

## 2021-03-25 DIAGNOSIS — M545 Low back pain, unspecified: Secondary | ICD-10-CM | POA: Diagnosis not present

## 2021-03-25 DIAGNOSIS — M48061 Spinal stenosis, lumbar region without neurogenic claudication: Secondary | ICD-10-CM | POA: Diagnosis not present

## 2021-04-02 DIAGNOSIS — M48061 Spinal stenosis, lumbar region without neurogenic claudication: Secondary | ICD-10-CM | POA: Diagnosis not present

## 2021-04-02 DIAGNOSIS — M545 Low back pain, unspecified: Secondary | ICD-10-CM | POA: Diagnosis not present

## 2021-04-09 DIAGNOSIS — M5116 Intervertebral disc disorders with radiculopathy, lumbar region: Secondary | ICD-10-CM | POA: Diagnosis not present

## 2021-04-09 DIAGNOSIS — M5416 Radiculopathy, lumbar region: Secondary | ICD-10-CM | POA: Diagnosis not present

## 2021-04-14 DIAGNOSIS — M48061 Spinal stenosis, lumbar region without neurogenic claudication: Secondary | ICD-10-CM | POA: Diagnosis not present

## 2021-04-14 DIAGNOSIS — M545 Low back pain, unspecified: Secondary | ICD-10-CM | POA: Diagnosis not present

## 2021-04-21 DIAGNOSIS — M48061 Spinal stenosis, lumbar region without neurogenic claudication: Secondary | ICD-10-CM | POA: Diagnosis not present

## 2021-04-21 DIAGNOSIS — M545 Low back pain, unspecified: Secondary | ICD-10-CM | POA: Diagnosis not present

## 2021-05-04 DIAGNOSIS — M48061 Spinal stenosis, lumbar region without neurogenic claudication: Secondary | ICD-10-CM | POA: Diagnosis not present

## 2021-05-04 DIAGNOSIS — M545 Low back pain, unspecified: Secondary | ICD-10-CM | POA: Diagnosis not present

## 2021-05-13 DIAGNOSIS — Z23 Encounter for immunization: Secondary | ICD-10-CM | POA: Diagnosis not present

## 2021-05-13 DIAGNOSIS — K9089 Other intestinal malabsorption: Secondary | ICD-10-CM | POA: Diagnosis not present

## 2021-05-13 DIAGNOSIS — Z0000 Encounter for general adult medical examination without abnormal findings: Secondary | ICD-10-CM | POA: Diagnosis not present

## 2021-05-13 DIAGNOSIS — E7800 Pure hypercholesterolemia, unspecified: Secondary | ICD-10-CM | POA: Diagnosis not present

## 2021-05-13 DIAGNOSIS — Z79899 Other long term (current) drug therapy: Secondary | ICD-10-CM | POA: Diagnosis not present

## 2021-05-13 DIAGNOSIS — Z125 Encounter for screening for malignant neoplasm of prostate: Secondary | ICD-10-CM | POA: Diagnosis not present

## 2021-05-14 DIAGNOSIS — H6121 Impacted cerumen, right ear: Secondary | ICD-10-CM | POA: Diagnosis not present

## 2021-05-22 DIAGNOSIS — M48061 Spinal stenosis, lumbar region without neurogenic claudication: Secondary | ICD-10-CM | POA: Diagnosis not present

## 2021-05-22 DIAGNOSIS — M545 Low back pain, unspecified: Secondary | ICD-10-CM | POA: Diagnosis not present

## 2021-05-28 DIAGNOSIS — L309 Dermatitis, unspecified: Secondary | ICD-10-CM | POA: Diagnosis not present

## 2021-05-28 DIAGNOSIS — D171 Benign lipomatous neoplasm of skin and subcutaneous tissue of trunk: Secondary | ICD-10-CM | POA: Diagnosis not present

## 2021-06-02 DIAGNOSIS — M48061 Spinal stenosis, lumbar region without neurogenic claudication: Secondary | ICD-10-CM | POA: Diagnosis not present

## 2021-06-04 DIAGNOSIS — M48061 Spinal stenosis, lumbar region without neurogenic claudication: Secondary | ICD-10-CM | POA: Diagnosis not present

## 2021-06-04 DIAGNOSIS — M545 Low back pain, unspecified: Secondary | ICD-10-CM | POA: Diagnosis not present

## 2021-06-22 DIAGNOSIS — M545 Low back pain, unspecified: Secondary | ICD-10-CM | POA: Diagnosis not present

## 2021-06-22 DIAGNOSIS — L0889 Other specified local infections of the skin and subcutaneous tissue: Secondary | ICD-10-CM | POA: Diagnosis not present

## 2021-06-22 DIAGNOSIS — L738 Other specified follicular disorders: Secondary | ICD-10-CM | POA: Diagnosis not present

## 2021-06-22 DIAGNOSIS — M48061 Spinal stenosis, lumbar region without neurogenic claudication: Secondary | ICD-10-CM | POA: Diagnosis not present

## 2021-06-22 DIAGNOSIS — L309 Dermatitis, unspecified: Secondary | ICD-10-CM | POA: Diagnosis not present

## 2021-07-13 DIAGNOSIS — L309 Dermatitis, unspecified: Secondary | ICD-10-CM | POA: Diagnosis not present

## 2021-07-14 DIAGNOSIS — M48061 Spinal stenosis, lumbar region without neurogenic claudication: Secondary | ICD-10-CM | POA: Diagnosis not present

## 2021-07-14 DIAGNOSIS — M545 Low back pain, unspecified: Secondary | ICD-10-CM | POA: Diagnosis not present

## 2021-08-11 DIAGNOSIS — L309 Dermatitis, unspecified: Secondary | ICD-10-CM | POA: Diagnosis not present

## 2021-08-11 DIAGNOSIS — L738 Other specified follicular disorders: Secondary | ICD-10-CM | POA: Diagnosis not present

## 2021-08-19 DIAGNOSIS — E7800 Pure hypercholesterolemia, unspecified: Secondary | ICD-10-CM | POA: Diagnosis not present

## 2021-08-19 DIAGNOSIS — Z79899 Other long term (current) drug therapy: Secondary | ICD-10-CM | POA: Diagnosis not present

## 2021-08-25 DIAGNOSIS — M5116 Intervertebral disc disorders with radiculopathy, lumbar region: Secondary | ICD-10-CM | POA: Diagnosis not present

## 2021-08-25 DIAGNOSIS — M5416 Radiculopathy, lumbar region: Secondary | ICD-10-CM | POA: Diagnosis not present

## 2021-12-07 DIAGNOSIS — M5416 Radiculopathy, lumbar region: Secondary | ICD-10-CM | POA: Diagnosis not present

## 2021-12-07 DIAGNOSIS — M5116 Intervertebral disc disorders with radiculopathy, lumbar region: Secondary | ICD-10-CM | POA: Diagnosis not present

## 2021-12-23 DIAGNOSIS — Z6827 Body mass index (BMI) 27.0-27.9, adult: Secondary | ICD-10-CM | POA: Diagnosis not present

## 2021-12-23 DIAGNOSIS — M48061 Spinal stenosis, lumbar region without neurogenic claudication: Secondary | ICD-10-CM | POA: Diagnosis not present

## 2022-01-18 DIAGNOSIS — K635 Polyp of colon: Secondary | ICD-10-CM | POA: Diagnosis not present

## 2022-01-18 DIAGNOSIS — K649 Unspecified hemorrhoids: Secondary | ICD-10-CM | POA: Diagnosis not present

## 2022-01-18 DIAGNOSIS — Z1211 Encounter for screening for malignant neoplasm of colon: Secondary | ICD-10-CM | POA: Diagnosis not present

## 2022-01-20 DIAGNOSIS — K635 Polyp of colon: Secondary | ICD-10-CM | POA: Diagnosis not present

## 2022-01-24 DIAGNOSIS — J029 Acute pharyngitis, unspecified: Secondary | ICD-10-CM | POA: Diagnosis not present

## 2022-01-24 DIAGNOSIS — J020 Streptococcal pharyngitis: Secondary | ICD-10-CM | POA: Diagnosis not present

## 2022-01-25 DIAGNOSIS — L239 Allergic contact dermatitis, unspecified cause: Secondary | ICD-10-CM | POA: Diagnosis not present

## 2022-02-16 ENCOUNTER — Ambulatory Visit (INDEPENDENT_AMBULATORY_CARE_PROVIDER_SITE_OTHER): Payer: 59 | Admitting: Podiatry

## 2022-02-16 ENCOUNTER — Encounter: Payer: Self-pay | Admitting: Podiatry

## 2022-02-16 ENCOUNTER — Ambulatory Visit (INDEPENDENT_AMBULATORY_CARE_PROVIDER_SITE_OTHER): Payer: 59 | Admitting: Psychiatry

## 2022-02-16 DIAGNOSIS — R69 Illness, unspecified: Secondary | ICD-10-CM | POA: Diagnosis not present

## 2022-02-16 DIAGNOSIS — F411 Generalized anxiety disorder: Secondary | ICD-10-CM | POA: Diagnosis not present

## 2022-02-16 DIAGNOSIS — B351 Tinea unguium: Secondary | ICD-10-CM | POA: Diagnosis not present

## 2022-02-16 MED ORDER — EFINACONAZOLE 10 % EX SOLN: 1.0000 [drp] | Freq: Every day | CUTANEOUS | 2 refills | Status: DC

## 2022-02-16 NOTE — Progress Notes (Signed)
  Subjective:  Patient ID: NICANDRO PERRAULT, male    DOB: 28-Sep-1955,   MRN: [id number]  Chief Complaint  Patient presents with   Nail Problem     TOE NAIL FUNGUS    66 y.o. male presents for concern of bilateral great toenail fungus that has been going on for about a month. Relates thickness in the toes and some discoloration. He is not diabetic. Denies any treatments.  . Denies any other pedal complaints. Denies n/v/f/c.   Past Medical History:  Diagnosis Date   Allergic rhinitis    Cardiovascular risk factor 08/2013   10 YEARS   Cervical spine disease    Dyspnea    Esophageal reflux    GERD (gastroesophageal reflux disease)    Gilbert's syndrome    Hypercholesterolemia    Hyperlipidemia    Inguinal hernia    RIGHT   Palpitations    Prostatitis    RBBB    Shingles 03/2014    Objective:  Physical Exam: Vascular: DP/PT pulses 2/4 bilateral. CFT <3 seconds. Normal hair growth on digits. No edema.  Skin. No lacerations or abrasions bilateral feet. Bilatearl hallux with some discoloration and left with dystrophic changes and thickness noted.  Musculoskeletal: MMT 5/5 bilateral lower extremities in DF, PF, Inversion and Eversion. Deceased ROM in DF of ankle joint.  Neurological: Sensation intact to light touch.   Assessment:   1. Onychomycosis      Plan:  Patient was evaluated and treated and all questions answered. -Examined patient -Discussed treatment options for painful dystrophic nails  -Discussed fungal nail treatment options including oral, topical, and laser treatments.  -Would like to try Jublia. Sent in prescription.  -Patient to return in 3 months for re-check.    Louann Sjogren, DPM

## 2022-02-16 NOTE — Progress Notes (Signed)
[facility] Counselor/Therapist Progress Note  Patient ID: Dustin Rice, MRN: [id number],    Date: 02/16/2022  Time Spent: 57 minutes   Treatment Type: Individual Therapy  Reported Symptoms: anxiety  Mental Status Exam:  Appearance:   Casual     Behavior:  Appropriate, Sharing, and Motivated  Motor:  Normal  Speech/Language:   Clear and Coherent  Affect:  anxious  Mood:  anxious  Thought process:  goal directed  Thought content:    WNL  Sensory/Perceptual disturbances:    WNL  Orientation:  oriented to person, place, time/date, situation, day of week, month of year, year, and stated date of February 16, 2022  Attention:  Good  Concentration:  Good  Memory:  WNL  Fund of knowledge:   Good  Insight:    Good  Judgment:   Good  Impulse Control:  Good   Risk Assessment: Danger to Self:  No Self-injurious Behavior: No Danger to Others: No Duty to Warn:no Physical Aggression / Violence:No  Access to Firearms a concern: No  Gang Involvement:No   Subjective: Patient in today wanting to work on communication with wife, "as we both recognize the need to be better communicators".  Issues of promptness and keeping each other informed of changes,etc. Troubled also with his aging mother (age 34) and now experiencing some memory issues. Also has experienced some breaking out issues on his skin and they now feel it' related to detergent. Getting PT and injections in back every 3 months per Dr. Danielle Dess.  Processed some information regarding his recently college graduated son who is now out in [place].  Reporting wife is doing some better with her health issues and both are considering joining a gym which I supported as a good idea for them physically and emotionally.  Continue CBT with patient again as we did in some of his prior therapy and helped him at that time.  Interventions: Cognitive Behavioral Therapy  Treatment Goals: Goals will remain on treatment plan as patient works  with strategies to achieve his goals.  Progress will be noted each session and documented in the "progress" section of note. Long term goal: Reduce overall level, frequency, and intensity of anxiety and depression so that daily functioning is not impaired. Short term goal: Increase understanding of beliefs and messages that produce worry and anxiety. Strategies: Identify and use specific coping strategies for anxiety and depression reduction.  Also explore alternative ways of communicating better with spouse and being more sensitive to needs within the family, including both wife and son.   Diagnosis:   ICD-10-CM   1. Generalized anxiety disorder  F41.1      Plan: Patient in today reporting anxiety as his main symptom and wanting to focus on it and some communication issues with wife that can help their relationship.  Concerned about his aging mother, age 30 who is now experiencing some memory issues.  Patient himself has some issues regarding his back and is being treated with injections every 3 months and PT as noted above.  Presents today with some moderate anxiety and concerns of wanting to enrich his marriage through healthier communication and working together better with his wife, and also in communicating with his adult son as noted above. Encouraged patient with continued working on strategies and positive behaviors between sessions including: Staying in the present and focusing on what he can control or change, aching breaks as needed, staying in touch with supportive people, remain on prescribed medication, healthy  nutrition and exercise, positive self talk, allow his faith to be an emotional support as well as spiritual, and recognize the strength he shows working with goal directed behaviors to move in a direction that supports his improved emotional health and overall wellbeing.  Goal review and progress/challenges noted with patient.  Next appointment within 2 to 3 weeks.  This  record has been created using [employer].  Chart creation errors have been sought, but may not always have been located and corrected.  Such creation errors do not reflect on the standard of medical care provided.   Mathis Fare, LCSW

## 2022-03-08 ENCOUNTER — Telehealth: Payer: Self-pay | Admitting: Podiatry

## 2022-03-08 NOTE — Telephone Encounter (Signed)
Patient called and stated that his medication was sent to the wrong pharmacy, please resend to [facility]

## 2022-03-08 NOTE — Telephone Encounter (Signed)
Yes the prescription was sent to [facility]. He was supposed to get a call from them to mail the medication to him. Other pharmacies will likely not have the medication.  Thanks

## 2022-03-09 DIAGNOSIS — M5116 Intervertebral disc disorders with radiculopathy, lumbar region: Secondary | ICD-10-CM | POA: Diagnosis not present

## 2022-03-09 DIAGNOSIS — M5416 Radiculopathy, lumbar region: Secondary | ICD-10-CM | POA: Diagnosis not present

## 2022-03-15 ENCOUNTER — Ambulatory Visit: Payer: 59 | Admitting: Psychiatry

## 2022-03-26 ENCOUNTER — Ambulatory Visit: Payer: 59 | Admitting: Psychiatry

## 2022-04-01 ENCOUNTER — Ambulatory Visit (INDEPENDENT_AMBULATORY_CARE_PROVIDER_SITE_OTHER): Payer: 59 | Admitting: Psychiatry

## 2022-04-01 DIAGNOSIS — R69 Illness, unspecified: Secondary | ICD-10-CM | POA: Diagnosis not present

## 2022-04-01 DIAGNOSIS — F411 Generalized anxiety disorder: Secondary | ICD-10-CM | POA: Diagnosis not present

## 2022-04-01 NOTE — Progress Notes (Signed)
[facility] Counselor/Therapist Progress Note  Patient ID: Dustin Rice, MRN: [id number],    Date: 04/01/2022  Time Spent: 55 minutes   Treatment Type: Individual Therapy  Reported Symptoms: anxiety, sadness, frustration  Mental Status Exam:  Appearance:   Casual     Behavior:  Appropriate, Sharing, and Motivated  Motor:  Normal  Speech/Language:   Clear and Coherent  Affect:  anxious  Mood:  anxious  Thought process:  goal directed  Thought content:    Obsessionsre: eating ; went back on [employer]  Sensory/Perceptual disturbances:    WNL  Orientation:  oriented to person, place, time/date, situation, day of week, month of year, year, and stated date of April 01, 2022  Attention:  Good  Concentration:  Good  Memory:  WNL  Fund of knowledge:   Good  Insight:    Good  Judgment:   Good  Impulse Control:  Good   Risk Assessment: Danger to Self:  No Self-injurious Behavior: No Danger to Others: No Duty to Warn:no Physical Aggression / Violence:No  Access to Firearms a concern: No  Gang Involvement:No   Subjective: Patient in today reporting anxiety, some sadness, and frustration. Recently started [employer] and trying to eat healthier.  Gets frustrated with himself and wife, not showing compassion like I should. Work is going well but stress may spill over into homelife. Less patient with others. Feels he needs to be more compassionate with his wife who has Type 1 Diabetes.  Worked today on better management of his frustration and also on some techniques that can help strengthen his relationship with his wife.  Also focusing on some limit setting with work so as to have more time within his family.  Really good attitude today and worked well in session on goal directed behaviors to help alleviate anxiety and frustration, and also able to work through some issues of sadness regarding family.  Admits that he and wife both need to improve their communication, as  discussed in session today.   Interventions: Cognitive Behavioral Therapy and Ego-Supportive  Treatment Goals: Goals will remain on treatment plan as patient works with strategies to achieve his goals.  Progress will be noted each session and documented in the "progress" section of note. Long term goal: Reduce overall level, frequency, and intensity of anxiety and depression so that daily functioning is not impaired. Short term goal: Increase understanding of beliefs and messages that produce worry and anxiety. Strategies: Identify and use specific coping strategies for anxiety and depression reduction.  Also explore alternative ways of communicating better with spouse and being more sensitive to needs within the family, including both wife and son.  Diagnosis:   ICD-10-CM   1. Generalized anxiety disorder  F41.1      Plan: Patient in today reporting anxiety, sadness, frustration related to personal and marital relationships.  As noted above he worked well addressing each of these symptoms and focusing on some ways he can change some of his schedule to have more priority time for home life.  Also emphasized improved communication skills which he stated he realized that needs to be improved especially within his marriage.  Showing a lot of motivation to work on further reducing his anxiety and better managing frustrations and sadness.  Appreciated working with specific examples in session today in regards to his frustrations, sadness, and anxiety.  To continue working on improved communication skills and anxiety management between sessions. Encouraged patient in continued work  with strategies and positive behaviors between sessions including: Remaining in the present and focusing on what he can control versus cannot, taking breaks as needed, positive self talk, staying in touch with supportive people, staying on his prescribed medication, healthy nutrition and exercise, allow his faith to be an  emotional support as well as spiritual, and realize the strength he shows working with goal directed behaviors to move in a direction that supports his improved emotional health and outlook.  Goal review and progress/challenges noted with patient.  Next appointment within 3 weeks.  This record has been created using [employer].  Chart creation errors have been sought, but may not always have been located and corrected.  Such creation errors do not reflect on the standard of medical care provided.   Mathis Fare, LCSW

## 2022-04-29 ENCOUNTER — Ambulatory Visit (INDEPENDENT_AMBULATORY_CARE_PROVIDER_SITE_OTHER): Payer: 59 | Admitting: Psychiatry

## 2022-04-29 DIAGNOSIS — R69 Illness, unspecified: Secondary | ICD-10-CM | POA: Diagnosis not present

## 2022-04-29 DIAGNOSIS — F4323 Adjustment disorder with mixed anxiety and depressed mood: Secondary | ICD-10-CM | POA: Diagnosis not present

## 2022-04-29 NOTE — Progress Notes (Signed)
[facility] Counselor/Therapist Progress Note  Patient ID: Dustin Rice, MRN: [id number],    Date: 04/29/2022  Time Spent: 55 minutes   Treatment Type: Individual Therapy  Reported Symptoms: anxiety, frustration  Mental Status Exam:  Appearance:   Neat     Behavior:  Appropriate, Sharing, and Motivated  Motor:  Normal  Speech/Language:   Clear and Coherent  Affect:  anxious  Mood:  anxious  Thought process:  goal directed  Thought content:    WNL  Sensory/Perceptual disturbances:    WNL  Orientation:  oriented to person, place, time/date, situation, day of week, month of year, year, and stated date of Sept. 21, 2023  Attention:  Good  Concentration:  Good  Memory:  WNL  Fund of knowledge:   Good  Insight:    Good  Judgment:   Good  Impulse Control:  Good   Risk Assessment: Danger to Self:  No Self-injurious Behavior: No Danger to Others: No Duty to Warn:no Physical Aggression / Violence:No  Access to Firearms a concern: No  Gang Involvement:No   Subjective: Patient today working on his anxiety related to personal and family situations. Elderly mother having some memory concerns and patient needing to process his concerns regarding her health and some memory issues.  Wife continues to have some struggles healthwise but has recently been okay to go to a gym and has begun going to the gym a couple times a week.  He and wife are working to have a healthy relationship, including being individually healthy and healthy as a couple, paying more attention to their physical and emotional health.  These are recent changes for them and they are hoping to "keep it going in a positive direction".  Son is back home currently but has a job interview with [employer] in the near future which would coincide well with his undergraduate degree.  Really encouraged patient regarding he and his wife's commitment to their personal health and marital health and patient seems to feel good about  this and wanting to make sure they continue to follow with this.  He continues to try and make their marital relationship a priority including showing more interest in support of wife, being sure to be in touch with her a couple of times during the day even if he is out on jobs.  His work is going quite well.  Continues to have concerns about wife's type 1 diabetes.  Patient's attitude is quite good.   Interventions: Cognitive Behavioral Therapy and Ego-Supportive  Treatment Goals: Goals will remain on treatment plan as patient works with strategies to achieve his goals.  Progress will be noted each session and documented in the "progress" section of note. Long term goal: Reduce overall level, frequency, and intensity of anxiety and depression so that daily functioning is not impaired. Short term goal: Increase understanding of beliefs and messages that produce worry and anxiety. Strategies: Identify and use specific coping strategies for anxiety and depression reduction.  Also explore alternative ways of communicating better with spouse and being more sensitive to needs within the family, including both wife and son.   Diagnosis:   ICD-10-CM   1. Adjustment disorder with mixed anxiety and depressed mood  F43.23      Plan: Patient in today showing good motivation and participation in session as he worked on personal and family anxieties including his elderly mother and his wife.  As noted above he is taking some positive steps and wanting to  continue increased support of his mother and wife especially with their health issues.  Review of goal-directed behaviors and patient is definitely progressing and needs continued therapy to work on continuing his gains and also to follow through on newer goals directed at improving the relationships with his wife especially as he experiences that contributing in a positive way to his mental health and outlook.  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  Has begun setting more limits with work in order to have more time for wife and family. Encouraged patient in his continued work on positive behaviors and strategies between sessions including: Staying in the present and focusing on what he can control versus cannot, taking breaks as needed, positive self talk, staying in touch with supportive people, staying on his prescribed medication, healthy nutrition and exercise, allow his faith to be an emotional support as well as spiritual, and recognize the strength he shows working with goal directed behaviors to move in a direction that supports his improved emotional health.  Goal review and progress/challenges noted with patient.  Next appointment within 3-4 weeks.  This record has been created using [employer].  Chart creation errors have been sought, but may not always have been located and corrected.  Such creation errors do not reflect on the standard of medical care provided.   Mathis Fare, LCSW

## 2022-05-19 DIAGNOSIS — K9089 Other intestinal malabsorption: Secondary | ICD-10-CM | POA: Diagnosis not present

## 2022-05-19 DIAGNOSIS — Z125 Encounter for screening for malignant neoplasm of prostate: Secondary | ICD-10-CM | POA: Diagnosis not present

## 2022-05-19 DIAGNOSIS — N401 Enlarged prostate with lower urinary tract symptoms: Secondary | ICD-10-CM | POA: Diagnosis not present

## 2022-05-19 DIAGNOSIS — E7800 Pure hypercholesterolemia, unspecified: Secondary | ICD-10-CM | POA: Diagnosis not present

## 2022-05-19 DIAGNOSIS — Z5181 Encounter for therapeutic drug level monitoring: Secondary | ICD-10-CM | POA: Diagnosis not present

## 2022-05-25 ENCOUNTER — Ambulatory Visit (INDEPENDENT_AMBULATORY_CARE_PROVIDER_SITE_OTHER): Payer: 59 | Admitting: Psychiatry

## 2022-05-25 DIAGNOSIS — F411 Generalized anxiety disorder: Secondary | ICD-10-CM | POA: Diagnosis not present

## 2022-05-25 DIAGNOSIS — R69 Illness, unspecified: Secondary | ICD-10-CM | POA: Diagnosis not present

## 2022-05-25 NOTE — Progress Notes (Signed)
[facility] Counselor/Therapist Progress Note  Patient ID: Dustin Rice, MRN: [id number],    Date: 05/25/2022  Time Spent: 55 minutes   Treatment Type: Individual Therapy  Reported Symptoms:  anxiety, frustration, some depression  Mental Status Exam:  Appearance:   Casual     Behavior:  Appropriate, Sharing, and Motivated  Motor:  Normal  Speech/Language:   Clear and Coherent  Affect:  anxious  Mood:  anxious  Thought process:  goal directed  Thought content:    WNL  Sensory/Perceptual disturbances:    WNL  Orientation:  oriented to person, place, time/date, situation, day of week, month of year, year, and stated date of Oct. 17, 2023  Attention:  Good  Concentration:  Good  Memory:  WNL  Fund of knowledge:   Good  Insight:    Good  Judgment:   Good  Impulse Control:  Good   Risk Assessment: Danger to Self:  No Self-injurious Behavior: No Danger to Others: No Duty to Warn:no Physical Aggression / Violence:No  Access to Firearms a concern: No  Gang Involvement:No   Subjective:  Patient called earlier to see if he could get an appt sooner that his next scheduled appt due to situations occurring within family and extended family re: health care needs within family being addressed. Significant family stress, anger, and difficulty making clear decisions. Difficulty within family in communication and stress management, assigning blame in situations that isn't helping, some "keeping score", with all of these things affecting the current climate of family relationships being quite volatile right now, and impacting patient's marital relationship.  Was able to talk through a lot of the current family situation as well as how it is impacting patient's marital relationship.  He is to talk with his wife again after this appointment today and make that a priority for right now before communicating further with his extended family on behalf of his mother and medical-related  decisions needing to be made.  Patient arrived very anxious and talking almost nonstop.  He was able to calm down the more he talked and eventually became quite calm talking and listening in the conversation as we came up with a plan for him to first talk with his wife in regards to their issues but then also some better ways of approaching extended family to get some healthier communication going on behalf of elderly mother and current needs.  Another stressor is patient's wife who is having some current health issues also and to be addressed with in the next couple of weeks.  Patient was much calmer by end of session and plans to talk with wife this afternoon at a time when both can be much more calm and listen and speak more intentionally with each other, in order to make better decisions.  (Not all details included in this note due to patient privacy needs.)  Interventions: Cognitive Behavioral Therapy and Ego-Supportive  Treatment Goals: Goals will remain on treatment plan as patient works with strategies to achieve his goals.  Progress will be noted each session and documented in the "progress" section of note. Long term goal: Reduce overall level, frequency, and intensity of anxiety and depression so that daily functioning is not impaired. Short term goal: Increase understanding of beliefs and messages that produce worry and anxiety. Strategies: Identify and use specific coping strategies for anxiety and depression reduction.  Also explore alternative ways of communicating better with spouse and being more sensitive to needs within the family,  including both wife and son.  Diagnosis:   ICD-10-CM   1. Generalized anxiety disorder  F41.1      Plan: Patient in today showing good motivation and active participation in session as he worked on relationships as well as healthcare issues regarding family and extended family.  Had met earlier with some family members and it was very chaotic and  unhelpful and trying to resolve any decisions to be made especially for elderly mother and her healthcare needs.  (Not all details included in this note due to patient privacy needs).  Patient was able to vent and was much calmer before leaving.  Regrets how some of the family is having difficulty working with the whole family in order to make best decisions with her mother.  Also regrets how the anger can end up affecting his own relationship with his wife who is also under a lot of stress with her health care needs and pending surgery likely.  He plans to talk with his wife after the session and have some time together where they can really be alone and talk through their stressors, really listening to each other and figuring out a better way to handle their communication and very stressful circumstances, emphasizing listening as well as talking in order to better understand each other.  We will pick up on this more with patient at his appointment next week but he was much more calm and had some good strategies to use as he left session today.  Continues to set limits with work in order to have more time for wife and family. Encouraged patient in more positive behaviors including: Continuing to make family a priority, staying in the present and focusing on what he can control versus cannot, taking breaks as needed, positive self talk, staying in touch with supportive people, staying on his prescribed medication, healthy nutrition and exercise, allow his face to be an emotional support as well as spiritual, and recognize the strength he shows working with goal-directed behaviors to move in a direction that supports his improved emotional health.  Goal review and progress/challenges noted with patient.   Next appointment within 2 weeks.  This record has been created using [employer].  Chart creation errors have been sought, but may not always have been located and corrected.  Such creation errors do not  reflect on the standard of medical care provided.   Shanon Ace, LCSW

## 2022-06-01 ENCOUNTER — Ambulatory Visit (INDEPENDENT_AMBULATORY_CARE_PROVIDER_SITE_OTHER): Payer: 59 | Admitting: Psychiatry

## 2022-06-01 DIAGNOSIS — F411 Generalized anxiety disorder: Secondary | ICD-10-CM

## 2022-06-01 DIAGNOSIS — R69 Illness, unspecified: Secondary | ICD-10-CM | POA: Diagnosis not present

## 2022-06-01 NOTE — Progress Notes (Signed)
[facility] Counselor/Therapist Progress Note  Patient ID: Dustin Rice, MRN: [id number],    Date: 06/01/2022  Time Spent: 55 minutes   Treatment Type: Individual Therapy  Reported Symptoms: anxiety, frustration ( improved)  Mental Status Exam:  Appearance:   Casual     Behavior:  Appropriate, Sharing, and Motivated  Motor:  Normal  Speech/Language:   Clear and Coherent  Affect:  anxious  Mood:  anxious  Thought process:  goal directed  Thought content:    WNL  Sensory/Perceptual disturbances:    WNL  Orientation:  oriented to person, place, time/date, situation, day of week, month of year, year, and stated date of Oct. 24, 2023  Attention:  Good  Concentration:  Good  Memory:  WNL  Fund of knowledge:   Good  Insight:    Good  Judgment:   Good  Impulse Control:  Good   Risk Assessment: Danger to Self:  No Self-injurious Behavior: No Danger to Others: No Duty to Warn:no Physical Aggression / Violence:No  Access to Firearms a concern: No  Gang Involvement:No   Subjective:  Patient today reporting some recent communication issues in marital relationship and proactively working on this is session today. Key issues are Active Listening and Asking for what I Need rather than being vague in talking. Dealing with issues of elderly mother's health and bringing that stress home with him. "Needing to manage that better". Wife is stressed ad nervous right now due to upcoming surgery in 2 weeks. Some anger issues with adult son and may end up "pushing him away" but not intentionally.  Stress has lessened some since last appt. Anger lessened. Stress management strategies reviewed. To refrain from "keeping score" within relationships. Processed major concerns about his elderly mom's health and working to make sure she has adequate caretakers, transportation, and sitters to be with her, which is adding to patient's stress some. Seemed to feel more calm and grounded and have some  good tools to use in his communication especially with family and particularly in asking for what he needs from others.   Interventions: Cognitive Behavioral Therapy and Ego-Supportive  Treatment Goals: Goals will remain on treatment plan as patient works with strategies to achieve his goals.  Progress will be noted each session and documented in the "progress" section of note. Long term goal: Reduce overall level, frequency, and intensity of anxiety and depression so that daily functioning is not impaired. Short term goal: Increase understanding of beliefs and messages that produce worry and anxiety. Strategies: Identify and use specific coping strategies for anxiety and depression reduction.  Also explore alternative ways of communicating better with spouse and being more sensitive to needs within the family, including both wife and son.  Diagnosis:   ICD-10-CM   1. Generalized anxiety disorder  F41.1      Plan: Patient in today with active participation and good motivation in session focusing on : communication especially in marital/family relationships, active listening, practicing asking for what he needs from others, helping elderly mom in her health concerns, and practicing more stress management strategies to help patient in all of these areas and especially his family.  Shares how he feels he has progressed and having some increased patience, understanding, and communication that includes making and listening.  Noticing progress since last session and wants to build on it.  To continue using strategies in these areas as noted above between now and next session within 2 to 3 weeks. Encouraged patient and  practicing more positive behaviors as noted in session including: Continuing to make family a priority, remaining in the present and focusing on what he can control versus cannot, take breaks as needed, positive self talk, staying in touch with supportive people, remaining on his prescribed  medication, healthy nutrition and exercise, allow his faith to be an emotional support as well as spiritual, and realize the strength he shows when working with goal-directed behaviors to move in a direction that supports his improved emotional health and overall outlook.  Goal review and progress/challenges noted with patient.  Next appointment within 2 to 3 weeks.  This record has been created using [employer].  Chart creation errors have been sought, but may not always have been located and corrected.  Such creation errors do not reflect on the standard of medical care provided.    Mathis Fare, LCSW

## 2022-06-07 DIAGNOSIS — M5416 Radiculopathy, lumbar region: Secondary | ICD-10-CM | POA: Diagnosis not present

## 2022-06-07 DIAGNOSIS — M5116 Intervertebral disc disorders with radiculopathy, lumbar region: Secondary | ICD-10-CM | POA: Diagnosis not present

## 2022-06-21 DIAGNOSIS — M48061 Spinal stenosis, lumbar region without neurogenic claudication: Secondary | ICD-10-CM | POA: Diagnosis not present

## 2022-06-22 ENCOUNTER — Ambulatory Visit (INDEPENDENT_AMBULATORY_CARE_PROVIDER_SITE_OTHER): Payer: 59 | Admitting: Psychiatry

## 2022-06-22 DIAGNOSIS — F411 Generalized anxiety disorder: Secondary | ICD-10-CM | POA: Diagnosis not present

## 2022-06-22 DIAGNOSIS — R69 Illness, unspecified: Secondary | ICD-10-CM | POA: Diagnosis not present

## 2022-06-22 NOTE — Progress Notes (Signed)
[facility] Counselor/Therapist Progress Note  Patient ID: Dustin Rice, MRN: [id number],    Date: 06/22/2022  Time Spent: 55 minutes   Treatment Type: Individual Therapy  Reported Symptoms: anxiety, stressed  Mental Status Exam:  Appearance:   Neat     Behavior:  Appropriate, Sharing, and Motivated  Motor:  Normal  Speech/Language:   Clear and Coherent  Affect:  anxious  Mood:  anxious  Thought process:  goal directed  Thought content:    WNL  Sensory/Perceptual disturbances:    WNL  Orientation:  oriented to person, place, time/date, situation, day of week, month of year, year, and stated date of Nov. 14, 2023  Attention:  Good  Concentration:  Good  Memory:  WNL  Fund of knowledge:   Good  Insight:    Good  Judgment:   Good  Impulse Control:  Good   Risk Assessment: Danger to Self:  No Self-injurious Behavior: No Danger to Others: No Duty to Warn:no Physical Aggression / Violence:No  Access to Firearms a concern: No  Gang Involvement:No   Subjective:  Patient today reporting anxiety and feeling stressed, mostly related to personal, family, and needs of aging mother with increased health issues. Processed his anxiety and feeling stressed at length today, some of which involves coordinating care for his mom with 24-hr care and family helping out as needed. Joined a gym and is looking forward to participating there. Trying to pay more attention to his own self-care. Looking at some future activities that he might consider "just to do something different" and is thinking about taking acting lessons. Wife's surgery went ok. Communication within marital relationship improving. Keeping each other informed. Anger not an issue currently. No "keeping score" in relationship and that is going better. Feeling more encouraged and motivated.  Interventions: Cognitive Behavioral Therapy and Ego-Supportive  Long term goal: Reduce overall level, frequency, and intensity of  anxiety and depression so that daily functioning is not impaired. Short term goal: Increase understanding of beliefs and messages that produce worry and anxiety. Strategies: Identify and use specific coping strategies for anxiety and depression reduction.  Also explore alternative ways of communicating better with spouse and being more sensitive to needs within the family, including both wife and son.  Diagnosis:   ICD-10-CM   1. Generalized anxiety disorder  F41.1      Plan:   Patient today showing good motivation and participation in session focusing on better management of stress and anxiety related to personal, marital, and covering the needs of his aging mother (age 64).  Working to coordinate family members to all help with supervision and and assistance with her mother.  States his wife has noticed improvements also in their relationship including active listening, asking for what they need each other, communication, and stress management.  Looks more relaxed today. Encouraged patient in his practice of more positive behaviors as noted in session including: Remaining in the present and focused on what he can control versus cannot, continuing to make family a high priority, take breaks as needed, positive self talk, staying in touch with supportive people, healthy nutrition and exercise, allow his face to be an emotional support as well as spiritual, and recognize the strength he shows when working with goal-directed behaviors to move in a direction that supports his improved emotional health.  Goal review and progress/challenges noted with patient.  Next appointment within 3 weeks.  This record has been created using [employer].  Chart creation  errors have been sought, but may not always have been located and corrected.  Such creation errors do not reflect on the standard of medical care provided.   Mathis Fare, LCSW

## 2022-06-30 DIAGNOSIS — R972 Elevated prostate specific antigen [PSA]: Secondary | ICD-10-CM | POA: Diagnosis not present

## 2022-07-14 ENCOUNTER — Ambulatory Visit (INDEPENDENT_AMBULATORY_CARE_PROVIDER_SITE_OTHER): Payer: 59 | Admitting: Psychiatry

## 2022-07-14 DIAGNOSIS — F411 Generalized anxiety disorder: Secondary | ICD-10-CM

## 2022-07-14 DIAGNOSIS — R69 Illness, unspecified: Secondary | ICD-10-CM | POA: Diagnosis not present

## 2022-07-14 NOTE — Progress Notes (Signed)
[facility] Counselor/Therapist Progress Note  Patient ID: Dustin Rice, MRN: [id number],    Date: 07/14/2022  Time Spent: 55 minutes   Treatment Type: Individual Therapy  Reported Symptoms: anxiety and stress  Mental Status Exam:  Appearance:   Casual     Behavior:  Appropriate, Sharing, and Motivated  Motor:  Normal  Speech/Language:   Clear and Coherent  Affect:  anxious  Mood:  anxious  Thought process:  goal directed  Thought content:    WNL  Sensory/Perceptual disturbances:    WNL  Orientation:  oriented to person, place, time/date, situation, day of week, month of year, year, and stated date of Dec. 6, 2023  Attention:  Good  Concentration:  Good  Memory:  WNL  Fund of knowledge:   Good  Insight:    Good  Judgment:   Good  Impulse Control:  Good   Risk Assessment: Danger to Self:  No Self-injurious Behavior: No Danger to Others: No Duty to Warn:no Physical Aggression / Violence:No  Access to Firearms a concern: No  Gang Involvement:No   Subjective:  Patient today concerned and sad re: friend's young adult son died of freak accident recently. Some good bonding time recently with his own young adult son. Needed part of session to process the young man's death as it was a real shock and patient did travel out of state to attend the funeral. Also processing the caregiving for his 33 yr old mother as patient has reached out to his 3 siblings in efforts to better coordinate the care and supervision their mother's needs.  Stressful times but is committed to his mother and working something out between him and the other siblings and hopefully balance out responsibilities more so that no 1 person is overstressed.  Reports follow-through on he and wife's communication and activities together and relationship is improving as they continue working on goals.  Refraining from "keeping score" and seem to be better and planning and working together.  Interventions: Cognitive  Behavioral Therapy, Solution-Oriented/Positive Psychology, and Ego-Supportive  Long term goal: Reduce overall level, frequency, and intensity of anxiety and depression so that daily functioning is not impaired. Short term goal: Increase understanding of beliefs and messages that produce worry and anxiety. Strategies: Identify and use specific coping strategies for anxiety and depression reduction.  Also explore alternative ways of communicating better with spouse and being more sensitive to needs within the family, including both wife and son.  Diagnosis:   ICD-10-CM   1. Generalized anxiety disorder  F41.1      Plan:  Patient actively participating in session today and showing good motivation in trying to further improve his management of anxiety and stress that is mostly related to family and personal stressors including providing for the needs and supervision of his 83 year old mother.  Patient in charge of coordinating himself and other family members and providing for the supervision and assistance with their mother including taking turns spending the night with her and then they are able to leave during the daytime as she has another person that comes during the day.  Has also continued very intentional work on improving his marital relationship with wife who reports noticing his efforts especially in the areas of communication with active listening, asking for what they need from each other, including each other in plans and details of daily life, and stress management. Encouraged patient in practicing more positive behaviors as noted in session including: Staying in the present and  focused on what he can control or change, continue to make family a high priority, take breaks as needed, positive self talk, staying in touch with supportive people, healthy nutrition and exercise, allowing his faith to be an emotional support as well as spiritual, and recognize the strength he shows when working  with goal-directed behaviors to move in a direction that supports his improved emotional health and overall outlook.  Goal review and progress/challenges noted with patient.  Next appointment within 3 weeks.  This record has been created using [employer].  Chart creation errors have been sought, but may not always have been located and corrected.  Such creation errors do not reflect on the standard of medical care provided.   Mathis Fare, LCSW

## 2022-07-20 NOTE — Therapy (Signed)
OUTPATIENT PHYSICAL THERAPY THORACOLUMBAR EVALUATION   Patient Name: Dustin Rice MRN: [id number] DOB:August 07, 1956, 66 y.o., male Today's Date: 07/22/2022  END OF SESSION:  PT End of Session - 07/21/22 0907     Visit Number 1    Number of Visits 12    Date for PT Re-Evaluation 09/01/22    Authorization Type AETNA    PT Start Time 0800    PT Stop Time [id number]    PT Time Calculation (min) 54 min    Activity Tolerance Patient tolerated treatment well    Behavior During Therapy [facility] for tasks assessed/performed             Past Medical History:  Diagnosis Date   Allergic rhinitis    Cardiovascular risk factor 08/2013   10 YEARS   Cervical spine disease    Dyspnea    Esophageal reflux    GERD (gastroesophageal reflux disease)    Gilbert's syndrome    Hypercholesterolemia    Hyperlipidemia    Inguinal hernia    RIGHT   Palpitations    Prostatitis    RBBB    Shingles 03/2014   Past Surgical History:  Procedure Laterality Date   HERNIA REPAIR  2004   Patient Active Problem List   Diagnosis Date Noted   Palpitations 05/12/2016     REFERRING PROVIDER: Kristeen Miss, MD  REFERRING DIAG: [phone] (ICD-10-CM) - Spinal stenosis, lumbar region without neurogenic claudication  Rationale for Evaluation and Treatment: Rehabilitation  THERAPY DIAG:  Other low back pain  Muscle weakness (generalized)  ONSET DATE: Onset Nov 2021 ; PT order 06/25/2022  SUBJECTIVE:                                                                                                                                                                                           SUBJECTIVE STATEMENT: Pt reports his pain began in November 2021 with no specific injury.  He does remember picking up some heavy tiles a few months prior though doesn't remember that causing pain.  Pt saw MD and had an injection which only lasted a couple of weeks.  Pt had PT in 2022 in [place] and didn't receive much  relief.  He went to see a PT in [place] for 1 visit.  Pt states PT was focused on extension in [place] which didn't help and the PT in [place] had exercises that didn't involve extension.  Pt thinks the PT in [place] did help.  Pt states he has been having lumbar injections every 3 months which provide significant relief though the last injection in early November didn't last as long.  PT  order on 06/25/22 indicated both land and aquatic PT.  Pt is a member here at [employer] and tries to come 3x/wk.  Pt does his PT stretches daily.  Pt walks around the track for 1/2 mile and also performs resistance training.    His pain worsens toward the end of the 3 months after the injections.  Pt has pain with extension movements.  Pt has soreness first thing in AM and also late in the day.  Pt does have some tingling in L foot.  His R LE bothers him the most typically, but the L LE bothered him more after the last injection.  Pain can limit his work activities, but the injection improves sx's.  He has difficulty with bed mobility.  Pt reports the stretches help.  He has pain with sitting especially performing computer work.  He uses ice in sitting which helps.      PERTINENT HISTORY:  Grade 1 anterolisthesis L3-L4 and L4-5  PAIN:  NPRS:  2-3/10 current, 8-9/10 worst, 0-1/10 best Location:  primarily central lumbar, bilat lumbar flanks, bilat post hip, and some down lateral LE's  PRECAUTIONS: Other: MRI findings  WEIGHT BEARING RESTRICTIONS: No  FALLS:  Has patient fallen in last 6 months? 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   PLOF: Independent.  Pt has had pain for 2 years which has affected his daily activities and tolerance to activityy.   PATIENT GOALS: To be able to go longer than 3 months between injections.  To improve core strength.  To improve pain.    OBJECTIVE:   DIAGNOSTIC FINDINGS:  X rays in 2021:  Lumbar:  Well-preserved lumbar  lordosis and intervertebral disc spaces.  Moderate  to severe lumbar spondylosis   Lumbar MRI in 2022: INDINGS:  #  Grade 1 anterolisthesis L3-4 and L4-5.  #  Vertebral body heights are well maintained.  #  Small meningioma within L2 and L4.  #  Conus terminates at T12-L1 without evidence of tethering.  #  Nerve roots appear normal.  #  Incidental findings: None.     #  L1-2: Normal.  #    #  L2-3: Normal.  #    #  L3-4: Mild degenerative disc disease. Grade 1 anterolisthesis. There is facet arthropathy and disc bulge causing moderate central canal stenosis with mild bilateral neural foraminal narrowing.  #    #  L4-5: Mild degenerative disc disease. Grade 1 anterolisthesis. Facet arthropathy and disc bulge causing moderate to severe central canal stenosis and mild bilateral neural foraminal narrowing.  #    #  L5-S1: Facet arthropathy and disc bulge without significant central canal stenosis or neuroforaminal narrowing.     IMPRESSION:    There is facet arthropathy and disc bulge as well as grade 1 anterolisthesis causing moderate to severe central canal stenosis at the level of L4-5 and moderate canal stenosis at L3-4.   PATIENT SURVEYS:  Will do FOTO next visit  SCREENING FOR RED FLAGS: Bowel or bladder incontinence: No Spinal tumors: No Cauda equina syndrome: No Compression fracture: No Abdominal aneurysm: No  COGNITION: Overall cognitive status: Within functional limits for tasks assessed       LUMBAR ROM:   AROM eval  Flexion WFL  Extension NT  Right lateral flexion WFL  Left lateral flexion WFL  Right rotation WFL  Left rotation 75% with pain   (Blank rows = not tested)  LOWER EXTREMITY ROM:  Active  Right eval Left eval  Hip flexion    Hip extension    Hip abduction [facility]  Hip adduction    Hip internal rotation    Hip external rotation    Knee flexion    Knee extension [facility]  Ankle dorsiflexion [facility]  Ankle plantarflexion [facility] WFL   Ankle inversion    Ankle eversion     (Blank rows = not tested)  LOWER EXTREMITY MMT:    MMT Right eval Left eval  Hip flexion 4+/5 4+/5  Hip extension    Hip abduction 4+/5 4/5  Hip adduction    Hip internal rotation    Hip external rotation 4/5 4+/5  Knee flexion 5/5 seated 5/5 seated  Knee extension 5/5 5/5  Ankle dorsiflexion 5/5 5/5  Ankle plantarflexion WFL seated  WFL seated  Ankle inversion    Ankle eversion     (Blank rows = not tested)   GAIT: Assistive device utilized: None Level of assistance: Complete Independence Comments: Pt ambulates with a normalized heel to toe gait pattern without limping.   TODAY'S TREATMENT:                                                                                                                               PT reviewed HEP.  Pt is performing supine Hip ant capsule stretch, supine piriformis/glute stretch, core bicycles, kneeling hip flexor stretch, prayer stretch central and bilat, and standing quad stretch.  PT showed pt the pool and educated pt in the aquatic process.  PT educated pt in the benefits, properties, and purpose of aquatic therapy. PT answered pt's questions.  See below for pt education.    PATIENT EDUCATION:  Education details:  PT used a spine model to educate pt concerning dx, anatomy, and spondylolisthesis.  Educated pt in proper posture but to avoid extension movements/exercises due to spondylolisthesis.  Educated pt in anterolisthesis precautions and rationale.  POC and rationale of exercises.  Person educated: Patient Education method: Customer service manager Education comprehension: verbalized understanding and needs further education  HOME EXERCISE PROGRAM: None given today.  Pt has a HEP.    ASSESSMENT:  CLINICAL IMPRESSION: Patient is a 66 y.o. male with a dx of Spinal stenosis, lumbar region without neurogenic claudication presenting to the clinic with LBP and LE muscle weakness.  Pt has  good lumbar AROM t/o except minimal limitation with L rotation.  Pt has been receiving lumbar injections every 3 months which typically provide significant relief though the last injection didn't last as long.  Pt has pain with extension movements.  Pain can limit his work activities, but the injection has improved sx's.  He has pain with sitting especially performing computer work.  He also has difficulty with bed mobility.  Pt has a stretching program that he regularly performs which helps.  Pt should benefit from skilled PT services to address impairments and improve overall function.  OBJECTIVE IMPAIRMENTS: decreased activity tolerance, decreased ROM, decreased strength, hypomobility, and pain.   ACTIVITY LIMITATIONS: lifting, sitting, and bed mobility  PARTICIPATION LIMITATIONS: occupation  PERSONAL FACTORS: Time since onset of injury/illness/exacerbation are also affecting patient's functional outcome.   REHAB POTENTIAL: Good  CLINICAL DECISION MAKING: Stable/uncomplicated  EVALUATION COMPLEXITY: Low   GOALS:   SHORT TERM GOALS: Target date: 08/11/2022   Pt will tolerate aquatic therapy without adverse effects for improved tolerance to activity, core strength, and pain.  Baseline: Goal status: INITIAL  2.  Pt will demo L rotation AROM to be [facility] for improved stiffness and mobility.  Baseline:  Goal status: INITIAL  3.  Pt will report at least a 25% improvement in pain and sx's overall.   Baseline:  Goal status: INITIAL    LONG TERM GOALS: Target date: 09/01/2022  Pt will demo improved hip strength to 5/5 bilat and improved core strength as evidenced by performance of core exercises without adverse effects for improved performance of daily activities and fxnl mobility with less pain.  Baseline:  Goal status: INITIAL  2.  Pt will be able to perform bed mobility without increased pain and difficulty.  Baseline:  Goal status: INITIAL  3.  Pt will be able to perform  work activities without significant pain and limitation.   Baseline:  Goal status: INITIAL  4.  Pt will be independent with aquatic program and land based HEP for improved pain, strength, ROM, and function.  Baseline:  Goal status: INITIAL    PLAN:  PT FREQUENCY: 2x/week  PT DURATION: 6 weeks  PLANNED INTERVENTIONS: Therapeutic exercises, Therapeutic activity, Neuromuscular re-education, Gait training, Patient/Family education, Self Care, Joint mobilization, Stair training, Aquatic Therapy, Dry Needling, Electrical stimulation, Spinal mobilization, Cryotherapy, Moist heat, Taping, Ultrasound, Manual therapy, and Re-evaluation.  PLAN FOR NEXT SESSION:   Give FOTO next visit.  Assess tenderness to palpation and soft tissue tightness in lumbar.  Assess HS flexibility.  Core strengthening.  Alternate land and aquatic therapy.    Selinda Michaels III PT, DPT 07/22/22 2:41 PM

## 2022-07-21 ENCOUNTER — Ambulatory Visit (HOSPITAL_BASED_OUTPATIENT_CLINIC_OR_DEPARTMENT_OTHER): Payer: 59 | Attending: Neurological Surgery | Admitting: Physical Therapy

## 2022-07-21 ENCOUNTER — Other Ambulatory Visit: Payer: Self-pay

## 2022-07-21 ENCOUNTER — Encounter (HOSPITAL_BASED_OUTPATIENT_CLINIC_OR_DEPARTMENT_OTHER): Payer: Self-pay | Admitting: Physical Therapy

## 2022-07-21 DIAGNOSIS — M6281 Muscle weakness (generalized): Secondary | ICD-10-CM | POA: Diagnosis not present

## 2022-07-21 DIAGNOSIS — M5459 Other low back pain: Secondary | ICD-10-CM | POA: Diagnosis not present

## 2022-07-21 DIAGNOSIS — M48061 Spinal stenosis, lumbar region without neurogenic claudication: Secondary | ICD-10-CM | POA: Insufficient documentation

## 2022-07-23 ENCOUNTER — Encounter (HOSPITAL_BASED_OUTPATIENT_CLINIC_OR_DEPARTMENT_OTHER): Payer: Self-pay | Admitting: Physical Therapy

## 2022-07-23 ENCOUNTER — Ambulatory Visit (HOSPITAL_BASED_OUTPATIENT_CLINIC_OR_DEPARTMENT_OTHER): Payer: 59 | Admitting: Physical Therapy

## 2022-07-23 DIAGNOSIS — M5459 Other low back pain: Secondary | ICD-10-CM | POA: Diagnosis not present

## 2022-07-23 DIAGNOSIS — M6281 Muscle weakness (generalized): Secondary | ICD-10-CM | POA: Diagnosis not present

## 2022-07-23 DIAGNOSIS — M48061 Spinal stenosis, lumbar region without neurogenic claudication: Secondary | ICD-10-CM | POA: Diagnosis not present

## 2022-07-23 NOTE — Therapy (Signed)
OUTPATIENT PHYSICAL THERAPY THORACOLUMBAR EVALUATION   Patient Name: Dustin Rice MRN: [id number] DOB:September 15, 1955, 66 y.o., male Today's Date: 07/23/2022  END OF SESSION:  PT End of Session - 07/23/22 1206     Visit Number 2    Number of Visits 12    Date for PT Re-Evaluation 09/01/22    Authorization Type AETNA    PT Start Time 1203    PT Stop Time 1245    PT Time Calculation (min) 42 min    Activity Tolerance Patient tolerated treatment well    Behavior During Therapy [facility] for tasks assessed/performed              Past Medical History:  Diagnosis Date   Allergic rhinitis    Cardiovascular risk factor 08/2013   10 YEARS   Cervical spine disease    Dyspnea    Esophageal reflux    GERD (gastroesophageal reflux disease)    Gilbert's syndrome    Hypercholesterolemia    Hyperlipidemia    Inguinal hernia    RIGHT   Palpitations    Prostatitis    RBBB    Shingles 03/2014   Past Surgical History:  Procedure Laterality Date   HERNIA REPAIR  2004   Patient Active Problem List   Diagnosis Date Noted   Palpitations 05/12/2016     REFERRING PROVIDER: Kristeen Miss, MD  REFERRING DIAG: [phone] (ICD-10-CM) - Spinal stenosis, lumbar region without neurogenic claudication  Rationale for Evaluation and Treatment: Rehabilitation  THERAPY DIAG:  Other low back pain  Muscle weakness (generalized)  ONSET DATE: Onset Nov 2021 ; PT order 06/25/2022  SUBJECTIVE:                                                                                                                                                                                           SUBJECTIVE STATEMENT: "Back pain 4-5/10.  Has come back quicker this time since my last shot"    Pt reports his pain began in November 2021 with no specific injury.  He does remember picking up some heavy tiles a few months prior though doesn't remember that causing pain.  Pt saw MD and had an injection which only lasted a  couple of weeks.  Pt had PT in 2022 in [place] and didn't receive much relief.  He went to see a PT in [place] for 1 visit.  Pt states PT was focused on extension in [place] which didn't help and the PT in [place] had exercises that didn't involve extension.  Pt thinks the PT in [place] did help.  Pt states he has been having lumbar injections every 3  months which provide significant relief though the last injection in early November didn't last as long.  PT order on 06/25/22 indicated both land and aquatic PT.  Pt is a member here at [employer] and tries to come 3x/wk.  Pt does his PT stretches daily.  Pt walks around the track for 1/2 mile and also performs resistance training.    His pain worsens toward the end of the 3 months after the injections.  Pt has pain with extension movements.  Pt has soreness first thing in AM and also late in the day.  Pt does have some tingling in L foot.  His R LE bothers him the most typically, but the L LE bothered him more after the last injection.  Pain can limit his work activities, but the injection improves sx's.  He has difficulty with bed mobility.  Pt reports the stretches help.  He has pain with sitting especially performing computer work.  He uses ice in sitting which helps.      PERTINENT HISTORY:  Grade 1 anterolisthesis L3-L4 and L4-5  PAIN:  NPRS:  24-5/10 current, 8-9/10 worst, 0-1/10 best Location:  primarily central lumbar, bilat lumbar flanks, bilat post hip, and some down lateral LE's  PRECAUTIONS: Other: MRI findings  WEIGHT BEARING RESTRICTIONS: No  FALLS:  Has patient fallen in last 6 months? 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   PLOF: Independent.  Pt has had pain for 2 years which has affected his daily activities and tolerance to activityy.   PATIENT GOALS: To be able to go longer than 3 months between injections.  To improve core strength.  To improve pain.    OBJECTIVE:    DIAGNOSTIC FINDINGS:  X rays in 2021:  Lumbar:  Well-preserved lumbar lordosis and intervertebral disc spaces.  Moderate  to severe lumbar spondylosis   Lumbar MRI in 2022: INDINGS:  #  Grade 1 anterolisthesis L3-4 and L4-5.  #  Vertebral body heights are well maintained.  #  Small meningioma within L2 and L4.  #  Conus terminates at T12-L1 without evidence of tethering.  #  Nerve roots appear normal.  #  Incidental findings: None.     #  L1-2: Normal.  #    #  L2-3: Normal.  #    #  L3-4: Mild degenerative disc disease. Grade 1 anterolisthesis. There is facet arthropathy and disc bulge causing moderate central canal stenosis with mild bilateral neural foraminal narrowing.  #    #  L4-5: Mild degenerative disc disease. Grade 1 anterolisthesis. Facet arthropathy and disc bulge causing moderate to severe central canal stenosis and mild bilateral neural foraminal narrowing.  #    #  L5-S1: Facet arthropathy and disc bulge without significant central canal stenosis or neuroforaminal narrowing.     IMPRESSION:    There is facet arthropathy and disc bulge as well as grade 1 anterolisthesis causing moderate to severe central canal stenosis at the level of L4-5 and moderate canal stenosis at L3-4.   PATIENT SURVEYS:  Will do FOTO next visit  SCREENING FOR RED FLAGS: Bowel or bladder incontinence: No Spinal tumors: No Cauda equina syndrome: No Compression fracture: No Abdominal aneurysm: No  COGNITION: Overall cognitive status: Within functional limits for tasks assessed       LUMBAR ROM:   AROM eval  Flexion WFL  Extension NT  Right lateral flexion WFL  Left lateral flexion WFL  Right rotation [facility]  Left rotation 75% with pain   (Blank rows = not tested)  LOWER EXTREMITY ROM:     Active  Right eval Left eval  Hip flexion    Hip extension    Hip abduction [facility]  Hip adduction    Hip internal rotation    Hip external rotation    Knee flexion    Knee  extension [facility]  Ankle dorsiflexion [facility]  Ankle plantarflexion [facility] WFL  Ankle inversion    Ankle eversion     (Blank rows = not tested)  LOWER EXTREMITY MMT:    MMT Right eval Left eval  Hip flexion 4+/5 4+/5  Hip extension    Hip abduction 4+/5 4/5  Hip adduction    Hip internal rotation    Hip external rotation 4/5 4+/5  Knee flexion 5/5 seated 5/5 seated  Knee extension 5/5 5/5  Ankle dorsiflexion 5/5 5/5  Ankle plantarflexion WFL seated  WFL seated  Ankle inversion    Ankle eversion     (Blank rows = not tested)   GAIT: Assistive device utilized: None Level of assistance: Complete Independence Comments: Pt ambulates with a normalized heel to toe gait pattern without limping.   TODAY'S TREATMENT:                                                                                                                               Pt seen for aquatic therapy today.  Treatment took place in water 3.25-4.5 ft in depth at the [place]. Temp of water was 91.  Pt entered/exited the pool via stairs using alternating pattern with hand rail.   *Walking forward, back and side stepping  *Stretching: L stretch, hamstring, gastroc, QL  *Hip hinge: for glute and hamstring engagement using wall for target point for proper execution    *Core engagement for strengthening and proprioceptive training: hand bells ue oscillations in frontal and sagittal planes feet staggered then   wide stance  2 sets of fast/slow 15s in each position.      - using ankle fins: LE add/abd and flex/extension (sagittal and frontal plane) oscillations 2 sets of 15s fast/slow R/L.  Pt requires the buoyancy and hydrostatic pressure of water for support, and to offload joints by unweighting joint load by at least 50 % in navel deep water and by at least 75-80% in chest to neck deep water.  Viscosity of the water is needed for resistance of strengthening. Water current perturbations provides challenge to  standing balance requiring increased core activation.      PATIENT EDUCATION:  Education details:  PT used a spine model to educate pt concerning dx, anatomy, and spondylolisthesis.  Educated pt in proper posture but to avoid extension movements/exercises due to spondylolisthesis.  Educated pt in anterolisthesis precautions and rationale.  POC and rationale of exercises.  Person educated: Patient Education method: Customer service manager Education comprehension: verbalized understanding and needs further education  HOME EXERCISE PROGRAM: None given today.  Pt has a HEP.    ASSESSMENT:  CLINICAL IMPRESSION: Pt demonstrates safety and indep in setting taking instruction from therapist on deck. He reports compliance with stretching program adding the ones land based therapist added.  He is comfortable in setting. Initiated session stretching then progressed to core engagement. Visually Left HS tighter than right.  He tolerates tasks fairly well.  LBP decreasing with session until LE oscillation in sagittal plane completed.  He reports increased discomfort in hips.  Will assess overall response to session next visit . Goals ongoing   Patient is a 66 y.o. male with a dx of Spinal stenosis, lumbar region without neurogenic claudication presenting to the clinic with LBP and LE muscle weakness.  Pt has good lumbar AROM t/o except minimal limitation with L rotation.  Pt has been receiving lumbar injections every 3 months which typically provide significant relief though the last injection didn't last as long.  Pt has pain with extension movements.  Pain can limit his work activities, but the injection has improved sx's.  He has pain with sitting especially performing computer work.  He also has difficulty with bed mobility.  Pt has a stretching program that he regularly performs which helps.  Pt should benefit from skilled PT services to address impairments and improve overall function.        OBJECTIVE IMPAIRMENTS: decreased activity tolerance, decreased ROM, decreased strength, hypomobility, and pain.   ACTIVITY LIMITATIONS: lifting, sitting, and bed mobility  PARTICIPATION LIMITATIONS: occupation  PERSONAL FACTORS: Time since onset of injury/illness/exacerbation are also affecting patient's functional outcome.   REHAB POTENTIAL: Good  CLINICAL DECISION MAKING: Stable/uncomplicated  EVALUATION COMPLEXITY: Low   GOALS:   SHORT TERM GOALS: Target date: 08/11/2022   Pt will tolerate aquatic therapy without adverse effects for improved tolerance to activity, core strength, and pain.  Baseline: Goal status: INITIAL  2.  Pt will demo L rotation AROM to be [facility] for improved stiffness and mobility.  Baseline:  Goal status: INITIAL  3.  Pt will report at least a 25% improvement in pain and sx's overall.   Baseline:  Goal status: INITIAL    LONG TERM GOALS: Target date: 09/01/2022  Pt will demo improved hip strength to 5/5 bilat and improved core strength as evidenced by performance of core exercises without adverse effects for improved performance of daily activities and fxnl mobility with less pain.  Baseline:  Goal status: INITIAL  2.  Pt will be able to perform bed mobility without increased pain and difficulty.  Baseline:  Goal status: INITIAL  3.  Pt will be able to perform work activities without significant pain and limitation.   Baseline:  Goal status: INITIAL  4.  Pt will be independent with aquatic program and land based HEP for improved pain, strength, ROM, and function.  Baseline:  Goal status: INITIAL    PLAN:  PT FREQUENCY: 2x/week  PT DURATION: 6 weeks  PLANNED INTERVENTIONS: Therapeutic exercises, Therapeutic activity, Neuromuscular re-education, Gait training, Patient/Family education, Self Care, Joint mobilization, Stair training, Aquatic Therapy, Dry Needling, Electrical stimulation, Spinal mobilization, Cryotherapy, Moist heat,  Taping, Ultrasound, Manual therapy, and Re-evaluation.  PLAN FOR NEXT SESSION:   Give FOTO next visit.  Assess tenderness to palpation and soft tissue tightness in lumbar.  Assess HS flexibility.  Core strengthening.  Alternate land and aquatic therapy.    Stanton Kidney Tharon Aquas) Klaryssa Fauth MPT 07/23/22 12:46 PM

## 2022-07-26 NOTE — Therapy (Signed)
OUTPATIENT PHYSICAL THERAPY THORACOLUMBAR EVALUATION   Patient Name: Dustin Rice MRN: [id number] DOB:May 10, 1956, 66 y.o., male Today's Date: 07/27/2022  END OF SESSION:  PT End of Session - 07/27/22 1407     Visit Number 3    Number of Visits 12    Date for PT Re-Evaluation 09/01/22    Authorization Type AETNA    PT Start Time 1405    PT Stop Time 1445    PT Time Calculation (min) 40 min    Activity Tolerance Patient tolerated treatment well    Behavior During Therapy [facility] for tasks assessed/performed               Past Medical History:  Diagnosis Date   Allergic rhinitis    Cardiovascular risk factor 08/2013   10 YEARS   Cervical spine disease    Dyspnea    Esophageal reflux    GERD (gastroesophageal reflux disease)    Gilbert's syndrome    Hypercholesterolemia    Hyperlipidemia    Inguinal hernia    RIGHT   Palpitations    Prostatitis    RBBB    Shingles 03/2014   Past Surgical History:  Procedure Laterality Date   HERNIA REPAIR  2004   Patient Active Problem List   Diagnosis Date Noted   Palpitations 05/12/2016     REFERRING PROVIDER: Barnett Abu, MD  REFERRING DIAG: [id number] (ICD-10-CM) - Spinal stenosis, lumbar region without neurogenic claudication  Rationale for Evaluation and Treatment: Rehabilitation  THERAPY DIAG:  Other low back pain  Muscle weakness (generalized)  ONSET DATE: Onset Nov 2021 ; PT order 06/25/2022  SUBJECTIVE:                                                                                                                                                                                           SUBJECTIVE STATEMENT: "Back pain 4/5 a little flared today"    Pt reports his pain began in November 2021 with no specific injury.  He does remember picking up some heavy tiles a few months prior though doesn't remember that causing pain.  Pt saw MD and had an injection which only lasted a couple of weeks.  Pt had PT in  2022 in [place] and didn't receive much relief.  He went to see a PT in [place] for 1 visit.  Pt states PT was focused on extension in [place] which didn't help and the PT in [place] had exercises that didn't involve extension.  Pt thinks the PT in [place] did help.  Pt states he has been having lumbar injections every 3 months which provide significant relief though  the last injection in early November didn't last as long.  PT order on 06/25/22 indicated both land and aquatic PT.  Pt is a member here at [employer] and tries to come 3x/wk.  Pt does his PT stretches daily.  Pt walks around the track for 1/2 mile and also performs resistance training.    His pain worsens toward the end of the 3 months after the injections.  Pt has pain with extension movements.  Pt has soreness first thing in AM and also late in the day.  Pt does have some tingling in L foot.  His R LE bothers him the most typically, but the L LE bothered him more after the last injection.  Pain can limit his work activities, but the injection improves sx's.  He has difficulty with bed mobility.  Pt reports the stretches help.  He has pain with sitting especially performing computer work.  He uses ice in sitting which helps.      PERTINENT HISTORY:  Grade 1 anterolisthesis L3-L4 and L4-5  PAIN:  NPRS:  24-5/10 current, 8-9/10 worst, 0-1/10 best Location:  primarily central lumbar, bilat lumbar flanks, bilat post hip, and some down lateral LE's  PRECAUTIONS: Other: MRI findings  WEIGHT BEARING RESTRICTIONS: No  FALLS:  Has patient fallen in last 6 months? 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   PLOF: Independent.  Pt has had pain for 2 years which has affected his daily activities and tolerance to activityy.   PATIENT GOALS: To be able to go longer than 3 months between injections.  To improve core strength.  To improve pain.    OBJECTIVE:   DIAGNOSTIC FINDINGS:  X rays in  2021:  Lumbar:  Well-preserved lumbar lordosis and intervertebral disc spaces.  Moderate  to severe lumbar spondylosis   Lumbar MRI in 2022: INDINGS:  #  Grade 1 anterolisthesis L3-4 and L4-5.  #  Vertebral body heights are well maintained.  #  Small meningioma within L2 and L4.  #  Conus terminates at T12-L1 without evidence of tethering.  #  Nerve roots appear normal.  #  Incidental findings: None.     #  L1-2: Normal.  #    #  L2-3: Normal.  #    #  L3-4: Mild degenerative disc disease. Grade 1 anterolisthesis. There is facet arthropathy and disc bulge causing moderate central canal stenosis with mild bilateral neural foraminal narrowing.  #    #  L4-5: Mild degenerative disc disease. Grade 1 anterolisthesis. Facet arthropathy and disc bulge causing moderate to severe central canal stenosis and mild bilateral neural foraminal narrowing.  #    #  L5-S1: Facet arthropathy and disc bulge without significant central canal stenosis or neuroforaminal narrowing.     IMPRESSION:    There is facet arthropathy and disc bulge as well as grade 1 anterolisthesis causing moderate to severe central canal stenosis at the level of L4-5 and moderate canal stenosis at L3-4.   PATIENT SURVEYS:  51 % risk adjusted with goal of 70  SCREENING FOR RED FLAGS: Bowel or bladder incontinence: No Spinal tumors: No Cauda equina syndrome: No Compression fracture: No Abdominal aneurysm: No  COGNITION: Overall cognitive status: Within functional limits for tasks assessed       LUMBAR ROM:   AROM eval  Flexion WFL  Extension NT  Right lateral flexion WFL  Left lateral flexion WFL  Right rotation [facility]  Left rotation  75% with pain   (Blank rows = not tested)  LOWER EXTREMITY ROM:     Active  Right eval Left eval  Hip flexion    Hip extension    Hip abduction [facility]  Hip adduction    Hip internal rotation    Hip external rotation    Knee flexion    Knee extension [facility]  Ankle  dorsiflexion [facility]  Ankle plantarflexion [facility] WFL  Ankle inversion    Ankle eversion     (Blank rows = not tested)  LOWER EXTREMITY MMT:    MMT Right eval Left eval  Hip flexion 4+/5 4+/5  Hip extension    Hip abduction 4+/5 4/5  Hip adduction    Hip internal rotation    Hip external rotation 4/5 4+/5  Knee flexion 5/5 seated 5/5 seated  Knee extension 5/5 5/5  Ankle dorsiflexion 5/5 5/5  Ankle plantarflexion WFL seated  WFL seated  Ankle inversion    Ankle eversion     (Blank rows = not tested)   GAIT: Assistive device utilized: None Level of assistance: Complete Independence Comments: Pt ambulates with a normalized heel to toe gait pattern without limping.   TODAY'S TREATMENT:                                                                                                                               [place]oto completed  Pt seen for aquatic therapy today.  Treatment took place in water 3.25-4.5 ft in depth at the [facility] pool. Temp of water was 91.  Pt entered/exited the pool via stairs using alternating pattern with hand rail.   *Walking forward, back and side stepping  *Stretching: L stretch, hamstring, gastroc, QL  *Hip hinge: for glute and hamstring engagement using wall for target point for proper execution    *Core engagement for strengthening and proprioceptive training: hand bells ue oscillations in  sagittal planes feet staggered then   wide stance  3 sets of fast/slow 15s in each position.      - using ankle fins: LE add/abd and flex/extension (sagittal and frontal plane) oscillations 3 sets of 15s fast/slow R/L.  -cycling on noodle/add/abd Pt requires the buoyancy and hydrostatic pressure of water for support, and to offload joints by unweighting joint load by at least 50 % in navel deep water and by at least 75-80% in chest to neck deep water.  Viscosity of the water is needed for resistance of strengthening. Water current perturbations provides  challenge to standing balance requiring increased core activation.      PATIENT EDUCATION:  Education details:  PT used a spine model to educate pt concerning dx, anatomy, and spondylolisthesis.  Educated pt in proper posture but to avoid extension movements/exercises due to spondylolisthesis.  Educated pt in anterolisthesis precautions and rationale.  POC and rationale of exercises.  Person educated: Patient Education method: Medical illustrator Education comprehension: verbalized understanding and needs  further education  HOME EXERCISE PROGRAM: None given today.  Pt has a HEP.    ASSESSMENT:  CLINICAL IMPRESSION: Pt reports good response to last session. States he felt good for the rest of the day. Progressed core strengthening added reps without difficulty. Instructed on LB distraction using noodle/cycling as well as adding aerobic  component.    Patient is a 66 y.o. male with a dx of Spinal stenosis, lumbar region without neurogenic claudication presenting to the clinic with LBP and LE muscle weakness.  Pt has good lumbar AROM t/o except minimal limitation with L rotation.  Pt has been receiving lumbar injections every 3 months which typically provide significant relief though the last injection didn't last as long.  Pt has pain with extension movements.  Pain can limit his work activities, but the injection has improved sx's.  He has pain with sitting especially performing computer work.  He also has difficulty with bed mobility.  Pt has a stretching program that he regularly performs which helps.  Pt should benefit from skilled PT services to address impairments and improve overall function.       OBJECTIVE IMPAIRMENTS: decreased activity tolerance, decreased ROM, decreased strength, hypomobility, and pain.   ACTIVITY LIMITATIONS: lifting, sitting, and bed mobility  PARTICIPATION LIMITATIONS: occupation  PERSONAL FACTORS: Time since onset of injury/illness/exacerbation  are also affecting patient's functional outcome.   REHAB POTENTIAL: Good  CLINICAL DECISION MAKING: Stable/uncomplicated  EVALUATION COMPLEXITY: Low   GOALS:   SHORT TERM GOALS: Target date: 08/11/2022   Pt will tolerate aquatic therapy without adverse effects for improved tolerance to activity, core strength, and pain.  Baseline: Goal status: INITIAL  2.  Pt will demo L rotation AROM to be [facility] for improved stiffness and mobility.  Baseline:  Goal status: INITIAL  3.  Pt will report at least a 25% improvement in pain and sx's overall.   Baseline:  Goal status: INITIAL    LONG TERM GOALS: Target date: 09/01/2022  Pt will demo improved hip strength to 5/5 bilat and improved core strength as evidenced by performance of core exercises without adverse effects for improved performance of daily activities and fxnl mobility with less pain.  Baseline:  Goal status: INITIAL  2.  Pt will be able to perform bed mobility without increased pain and difficulty.  Baseline:  Goal status: INITIAL  3.  Pt will be able to perform work activities without significant pain and limitation.   Baseline:  Goal status: INITIAL  4.  Pt will be independent with aquatic program and land based HEP for improved pain, strength, ROM, and function.  Baseline:  Goal status: INITIAL    PLAN:  PT FREQUENCY: 2x/week  PT DURATION: 6 weeks  PLANNED INTERVENTIONS: Therapeutic exercises, Therapeutic activity, Neuromuscular re-education, Gait training, Patient/Family education, Self Care, Joint mobilization, Stair training, Aquatic Therapy, Dry Needling, Electrical stimulation, Spinal mobilization, Cryotherapy, Moist heat, Taping, Ultrasound, Manual therapy, and Re-evaluation.  PLAN FOR NEXT SESSION:  Assess tenderness to palpation and soft tissue tightness in lumbar.  Assess HS flexibility.  Core strengthening.  Alternate land and aquatic therapy.    Corrie Dandy Tomma Lightning) Yakir Wenke MPT 07/27/22 2:24 PM

## 2022-07-27 ENCOUNTER — Ambulatory Visit (HOSPITAL_BASED_OUTPATIENT_CLINIC_OR_DEPARTMENT_OTHER): Payer: 59 | Admitting: Physical Therapy

## 2022-07-27 ENCOUNTER — Encounter (HOSPITAL_BASED_OUTPATIENT_CLINIC_OR_DEPARTMENT_OTHER): Payer: Self-pay | Admitting: Physical Therapy

## 2022-07-27 DIAGNOSIS — M6281 Muscle weakness (generalized): Secondary | ICD-10-CM | POA: Diagnosis not present

## 2022-07-27 DIAGNOSIS — M48061 Spinal stenosis, lumbar region without neurogenic claudication: Secondary | ICD-10-CM | POA: Diagnosis not present

## 2022-07-27 DIAGNOSIS — M5459 Other low back pain: Secondary | ICD-10-CM

## 2022-07-28 ENCOUNTER — Ambulatory Visit (INDEPENDENT_AMBULATORY_CARE_PROVIDER_SITE_OTHER): Payer: 59 | Admitting: Psychiatry

## 2022-07-28 DIAGNOSIS — R69 Illness, unspecified: Secondary | ICD-10-CM | POA: Diagnosis not present

## 2022-07-28 DIAGNOSIS — F411 Generalized anxiety disorder: Secondary | ICD-10-CM | POA: Diagnosis not present

## 2022-07-28 NOTE — Progress Notes (Signed)
[facility] Counselor/Therapist Progress Note  Patient ID: Dustin Rice, MRN: [id number],    Date: 07/28/2022  Time Spent: 55 minutes   Treatment Type: Individual Therapy  Reported Symptoms:  frustration, anxiety, "no depression"  Mental Status Exam:  Appearance:   Casual     Behavior:  Appropriate, Sharing, and Motivated  Motor:  Normal  Speech/Language:   Clear and Coherent  Affect:  anxious  Mood:  anxious  Thought process:  goal directed  Thought content:    WNL  Sensory/Perceptual disturbances:    WNL  Orientation:  oriented to person, place, time/date, situation, day of week, month of year, year, and stated date of Dec. 20, 2023  Attention:  Good  Concentration:  Good  Memory:  WNL  Fund of knowledge:   Good  Insight:    Good  Judgment:   Good  Impulse Control:  Good   Risk Assessment: Danger to Self:  No Self-injurious Behavior: No Danger to Others: No Duty to Warn:no Physical Aggression / Violence:No  Access to Firearms a concern: No  Gang Involvement:No   Subjective: Patient in today reporting anxiety and frustration. Frustration re: family situation re: care of aging mother. Facing and experiencing significant stress,distrust, and uncertainty in trying re-look at how the sibling group manages the care/supervision/and financial affairs, and round-the-clock care for their mother. Processing a lot of family issues relating to his mother and his siblings, and talking through his frustration and anxiety which seemed helpful to patient in moving forward although that is a gradual process including multiple siblings and personalities.  Family situation very stressful and patient is remaining very attentive and committed to his mother and her care, while working with the different caregivers involved in scheduling with non-- family members.  Remains hopeful of eventually having a more evenly balanced sharing of responsibility between him and his siblings in  overseeing the care of his mother.  Interventions: Cognitive Behavioral Therapy, Solution-Oriented/Positive Psychology, and Ego-Supportive  Long term goal: Reduce overall level, frequency, and intensity of anxiety and depression so that daily functioning is not impaired. Short term goal: Increase understanding of beliefs and messages that produce worry and anxiety. Strategies: Identify and use specific coping strategies for anxiety and depression reduction.  Also explore alternative ways of communicating better with spouse and being more sensitive to needs within the family, including both wife and son.  Diagnosis:   ICD-10-CM   1. Generalized anxiety disorder  F41.1      Plan:  Patient today showing good motivation and actively participating in session focusing on some continued communication and decision-making issues that he is trying to work on with his other adult siblings in order to make decisions for their aging mother in her 23s.  From what he shared today it does sound like the group of siblings have at least communicated a little more and are hopefully going to pull together to oversee the coordinated care for their mother and ways that all can be involved.  (Not all details included in this note due to patient privacy needs.)  Patient appeared to be a little less stressed than he had been earlier about these issues.  Is feeling better and his marital and family relationship and intentionally participating in more activities with his wife.  Feels that his communication including active listening and some stress management has improved. Encouraged patient in his practice of positive behaviors as noted in session including: Remaining in the present and focus on the  things he can change or control, positive self talk, staying in touch with supportive people, healthy nutrition and exercise, allow his faith to be an emotional support as well as spiritual, continue to make his family a high  priority, and realize the strength he shows as he works with goal-directed behaviors to move in a direction that supports his improved emotional health.  Goal review and progress/challenges noted with patient.  Next appointment within 2 to 3 weeks.  This record has been created using [employer].  Chart creation errors have been sought, but may not always have been located and corrected.  Such creation errors do not reflect on the standard of medical care provided.   Shanon Ace, LCSW

## 2022-07-30 ENCOUNTER — Encounter (HOSPITAL_BASED_OUTPATIENT_CLINIC_OR_DEPARTMENT_OTHER): Payer: Self-pay | Admitting: Physical Therapy

## 2022-07-30 ENCOUNTER — Ambulatory Visit (HOSPITAL_BASED_OUTPATIENT_CLINIC_OR_DEPARTMENT_OTHER): Payer: 59 | Admitting: Physical Therapy

## 2022-07-30 DIAGNOSIS — M5459 Other low back pain: Secondary | ICD-10-CM

## 2022-07-30 DIAGNOSIS — M6281 Muscle weakness (generalized): Secondary | ICD-10-CM | POA: Diagnosis not present

## 2022-07-30 DIAGNOSIS — M48061 Spinal stenosis, lumbar region without neurogenic claudication: Secondary | ICD-10-CM | POA: Diagnosis not present

## 2022-07-30 NOTE — Therapy (Signed)
OUTPATIENT PHYSICAL THERAPY THORACOLUMBAR    Patient Name: Dustin Rice MRN: [id number] DOB:07/15/1956, 66 y.o., male Today's Date: 07/30/2022  END OF SESSION:  PT End of Session - 07/30/22 1203     Visit Number 4    Number of Visits 12    Date for PT Re-Evaluation 09/01/22    Authorization Type AETNA    PT Start Time 1200    PT Stop Time 1245    PT Time Calculation (min) 45 min    Activity Tolerance Patient tolerated treatment well    Behavior During Therapy [facility] for tasks assessed/performed               Past Medical History:  Diagnosis Date   Allergic rhinitis    Cardiovascular risk factor 08/2013   10 YEARS   Cervical spine disease    Dyspnea    Esophageal reflux    GERD (gastroesophageal reflux disease)    Gilbert's syndrome    Hypercholesterolemia    Hyperlipidemia    Inguinal hernia    RIGHT   Palpitations    Prostatitis    RBBB    Shingles 03/2014   Past Surgical History:  Procedure Laterality Date   HERNIA REPAIR  2004   Patient Active Problem List   Diagnosis Date Noted   Palpitations 05/12/2016     REFERRING PROVIDER: Barnett AbuElsner, Henry, MD  REFERRING DIAG: [phone] (ICD-10-CM) - Spinal stenosis, lumbar region without neurogenic claudication  Rationale for Evaluation and Treatment: Rehabilitation  THERAPY DIAG:  Other low back pain  Muscle weakness (generalized)  ONSET DATE: Onset Nov 2021 ; PT order 06/25/2022  SUBJECTIVE:                                                                                                                                                                                           SUBJECTIVE STATEMENT: "Felt really good yesterday, a little sore after last treatment" I want to be able to do the water therapy on my own"    Pt reports his pain began in November 2021 with no specific injury.  He does remember picking up some heavy tiles a few months prior though doesn't remember that causing pain.  Pt saw MD  and had an injection which only lasted a couple of weeks.  Pt had [place] in [place]22 in [place] and didn't receive much relief.  He went to see a PT in [place] for 1 visit.  Pt states PT was focused on extension in [place] which didn't help and the PT in [place] had exercises that didn't involve extension.  Pt thinks the PT in [place] did help.  Pt states he has been having lumbar injections every 3 months which provide significant relief though the last injection in early November didn't last as long.  PT order on 06/25/22 indicated both land and aquatic PT.  Pt is a member here at [employer] and tries to come 3x/wk.  Pt does his PT stretches daily.  Pt walks around the track for 1/2 mile and also performs resistance training.    His pain worsens toward the end of the 3 months after the injections.  Pt has pain with extension movements.  Pt has soreness first thing in AM and also late in the day.  Pt does have some tingling in L foot.  His R LE bothers him the most typically, but the L LE bothered him more after the last injection.  Pain can limit his work activities, but the injection improves sx's.  He has difficulty with bed mobility.  Pt reports the stretches help.  He has pain with sitting especially performing computer work.  He uses ice in sitting which helps.      PERTINENT HISTORY:  Grade 1 anterolisthesis L3-L4 and L4-5  PAIN:  NPRS:  24-5/10 current, 8-9/10 worst, 0-1/10 best Location:  primarily central lumbar, bilat lumbar flanks, bilat post hip, and some down lateral LE's  PRECAUTIONS: Other: MRI findings  WEIGHT BEARING RESTRICTIONS: No  FALLS:  Has patient fallen in last 6 months? 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   PLOF: Independent.  Pt has had pain for 2 years which has affected his daily activities and tolerance to activityy.   PATIENT GOALS: To be able to go longer than 3 months between injections.  To improve core strength.   To improve pain.    OBJECTIVE:   DIAGNOSTIC FINDINGS:  X rays in 2021:  Lumbar:  Well-preserved lumbar lordosis and intervertebral disc spaces.  Moderate  to severe lumbar spondylosis   Lumbar MRI in 2022: INDINGS:  #  Grade 1 anterolisthesis L3-4 and L4-5.  #  Vertebral body heights are well maintained.  #  Small meningioma within L2 and L4.  #  Conus terminates at T12-L1 without evidence of tethering.  #  Nerve roots appear normal.  #  Incidental findings: None.     #  L1-2: Normal.  #    #  L2-3: Normal.  #    #  L3-4: Mild degenerative disc disease. Grade 1 anterolisthesis. There is facet arthropathy and disc bulge causing moderate central canal stenosis with mild bilateral neural foraminal narrowing.  #    #  L4-5: Mild degenerative disc disease. Grade 1 anterolisthesis. Facet arthropathy and disc bulge causing moderate to severe central canal stenosis and mild bilateral neural foraminal narrowing.  #    #  L5-S1: Facet arthropathy and disc bulge without significant central canal stenosis or neuroforaminal narrowing.     IMPRESSION:    There is facet arthropathy and disc bulge as well as grade 1 anterolisthesis causing moderate to severe central canal stenosis at the level of L4-5 and moderate canal stenosis at L3-4.   PATIENT SURVEYS:  51 % risk adjusted with goal of 66  SCREENING FOR RED FLAGS: Bowel or bladder incontinence: No Spinal tumors: No Cauda equina syndrome: No Compression fracture: No Abdominal aneurysm: No  COGNITION: Overall cognitive status: Within functional limits for tasks assessed       LUMBAR ROM:   AROM eval  Flexion WFL  Extension NT  Right lateral flexion WFL  Left lateral flexion WFL  Right rotation WFL  Left rotation 75% with pain   (Blank rows = not tested)  LOWER EXTREMITY ROM:     Active  Right eval Left eval  Hip flexion    Hip extension    Hip abduction [facility]  Hip adduction    Hip internal rotation    Hip  external rotation    Knee flexion    Knee extension [facility]  Ankle dorsiflexion [facility]  Ankle plantarflexion [facility] WFL  Ankle inversion    Ankle eversion     (Blank rows = not tested)  LOWER EXTREMITY MMT:    MMT Right eval Left eval  Hip flexion 4+/5 4+/5  Hip extension    Hip abduction 4+/5 4/5  Hip adduction    Hip internal rotation    Hip external rotation 4/5 4+/5  Knee flexion 5/5 seated 5/5 seated  Knee extension 5/5 5/5  Ankle dorsiflexion 5/5 5/5  Ankle plantarflexion WFL seated  WFL seated  Ankle inversion    Ankle eversion     (Blank rows = not tested)   GAIT: Assistive device utilized: None Level of assistance: Complete Independence Comments: Pt ambulates with a normalized heel to toe gait pattern without limping.   TODAY'S TREATMENT:                                                                                                                               Pt seen for aquatic therapy today.  Treatment took place in water 3.25-4.5 ft in depth at the [facility] pool. Temp of water was 91.  Pt entered/exited the pool via stairs using alternating pattern with hand rail.   *Walking forward, back and side stepping  *Stretching: L stretch, hamstring, gastroc, QL               *Side Lunges ue with yellow hand buoys add/abd  *Hip hinge: for glute and hamstring engagement using wall for target point for proper execution  *KB row feet staggered then wide stance 3x15 reps in ea position. Cues for increasing speed.   -resisted lateral rotation R/L  *Noodle press using round noodles: isometric hold; press to legs; then intermittent isometric holds at level  *cycling on noodle/add/abd Pt requires the buoyancy and hydrostatic pressure of water for support, and to offload joints by unweighting joint load by at least 50 % in navel deep water and by at least 75-80% in chest to neck deep water.  Viscosity of the water is needed for resistance of strengthening. Water  current perturbations provides challenge to standing balance requiring increased core activation.      PATIENT EDUCATION:  Education details:  PT used a spine model to educate pt concerning dx, anatomy, and spondylolisthesis.  Educated pt in proper posture but to avoid extension movements/exercises due to spondylolisthesis.  Educated pt in anterolisthesis precautions and rationale.  POC and rationale  of exercises.  Person educated: Patient Education method: Medical illustrator Education comprehension: verbalized understanding and needs further education  HOME EXERCISE PROGRAM: None given today.  Pt has a HEP  Added aquatics: Access Code: [email or web address] URL: [email or web address] Date: 07/30/2022 Prepared by: Geni Bers  Exercises - Kettlebell Suitcase Carry  - 1 x daily - 7 x weekly - 3 sets - 10 reps - Bilateral Shoulder Horizontal Abduction Adduction AROM  - 1 x daily - 7 x weekly - 3 sets - 10 reps - Standing Hip Hinge  - 1 x daily - 7 x weekly - 3 sets - 10 reps - Standing Bilateral Low Shoulder Row with Anchored Resistance  - 1 x daily - 7 x weekly - 3 sets - 10 reps - Standing Low Shoulder Row with Anchored Resistance  - 1 x daily - 7 x weekly - 3 sets - 10 reps - Seated Straddle on Flotation Forward Breast Stroke Arms and Bicycle Legs  - 1 x daily - 7 x weekly - 3 sets - 10 reps  ASSESSMENT:  CLINICAL IMPRESSION: Pt to complete aquatics indep next week. Exercises completed using equipment he has access to here at [facility]. HEP established, laminated and issued. He is given vc and demonstration for execution of tasks. Encouraged to maintain abd bracing throughout to improve posture and positioning with exercises. He demonstrates and VU. Goals ongoing     Patient is a 66 y.o. male with a dx of Spinal stenosis, lumbar region without neurogenic claudication presenting to the clinic with LBP and LE muscle weakness.  Pt has good lumbar AROM t/o except  minimal limitation with L rotation.  Pt has been receiving lumbar injections every 3 months which typically provide significant relief though the last injection didn't last as long.  Pt has pain with extension movements.  Pain can limit his work activities, but the injection has improved sx's.  He has pain with sitting especially performing computer work.  He also has difficulty with bed mobility.  Pt has a stretching program that he regularly performs which helps.  Pt should benefit from skilled PT services to address impairments and improve overall function.       OBJECTIVE IMPAIRMENTS: decreased activity tolerance, decreased ROM, decreased strength, hypomobility, and pain.   ACTIVITY LIMITATIONS: lifting, sitting, and bed mobility  PARTICIPATION LIMITATIONS: occupation  PERSONAL FACTORS: Time since onset of injury/illness/exacerbation are also affecting patient's functional outcome.   REHAB POTENTIAL: Good  CLINICAL DECISION MAKING: Stable/uncomplicated  EVALUATION COMPLEXITY: Low   GOALS:   SHORT TERM GOALS: Target date: 08/11/2022   Pt will tolerate aquatic therapy without adverse effects for improved tolerance to activity, core strength, and pain.  Baseline: Goal status: INITIAL  2.  Pt will demo L rotation AROM to be [facility] for improved stiffness and mobility.  Baseline:  Goal status: INITIAL  3.  Pt will report at least a 25% improvement in pain and sx's overall.   Baseline:  Goal status: INITIAL    LONG TERM GOALS: Target date: 09/01/2022  Pt will demo improved hip strength to 5/5 bilat and improved core strength as evidenced by performance of core exercises without adverse effects for improved performance of daily activities and fxnl mobility with less pain.  Baseline:  Goal status: INITIAL  2.  Pt will be able to perform bed mobility without increased pain and difficulty.  Baseline:  Goal status: INITIAL  3.  Pt will be able to perform work activities without  significant pain and  limitation.   Baseline:  Goal status: INITIAL  4.  Pt will be independent with aquatic program and land based HEP for improved pain, strength, ROM, and function.  Baseline:  Goal status: INITIAL    PLAN:  PT FREQUENCY: 2x/week  PT DURATION: 6 weeks  PLANNED INTERVENTIONS: Therapeutic exercises, Therapeutic activity, Neuromuscular re-education, Gait training, Patient/Family education, Self Care, Joint mobilization, Stair training, Aquatic Therapy, Dry Needling, Electrical stimulation, Spinal mobilization, Cryotherapy, Moist heat, Taping, Ultrasound, Manual therapy, and Re-evaluation.  PLAN FOR NEXT SESSION:  Assess tenderness to palpation and soft tissue tightness in lumbar.  Assess HS flexibility.  Core strengthening.  Alternate land and aquatic therapy.    Corrie Dandy Tomma Lightning) Torunn Chancellor MPT 07/30/22 12:04 PM

## 2022-08-10 ENCOUNTER — Ambulatory Visit (HOSPITAL_BASED_OUTPATIENT_CLINIC_OR_DEPARTMENT_OTHER): Payer: BC Managed Care – PPO | Attending: Neurological Surgery | Admitting: Physical Therapy

## 2022-08-10 ENCOUNTER — Encounter (HOSPITAL_BASED_OUTPATIENT_CLINIC_OR_DEPARTMENT_OTHER): Payer: Self-pay | Admitting: Physical Therapy

## 2022-08-10 DIAGNOSIS — M5459 Other low back pain: Secondary | ICD-10-CM | POA: Diagnosis not present

## 2022-08-10 DIAGNOSIS — M6281 Muscle weakness (generalized): Secondary | ICD-10-CM | POA: Diagnosis not present

## 2022-08-10 NOTE — Therapy (Signed)
OUTPATIENT PHYSICAL THERAPY THORACOLUMBAR    Patient Name: Dustin Rice MRN: [id number] DOB:12/07/1955, 67 y.o., male Today's Date: 08/10/2022  END OF SESSION:  PT End of Session - 08/10/22 1708     Visit Number 5    Number of Visits 12    Date for PT Re-Evaluation 09/01/22    Authorization Type AETNA    PT Start Time 1706    PT Stop Time 1745    PT Time Calculation (min) 39 min    Activity Tolerance Patient tolerated treatment well    Behavior During Therapy [facility] for tasks assessed/performed               Past Medical History:  Diagnosis Date   Allergic rhinitis    Cardiovascular risk factor 08/2013   10 YEARS   Cervical spine disease    Dyspnea    Esophageal reflux    GERD (gastroesophageal reflux disease)    Gilbert's syndrome    Hypercholesterolemia    Hyperlipidemia    Inguinal hernia    RIGHT   Palpitations    Prostatitis    RBBB    Shingles 03/2014   Past Surgical History:  Procedure Laterality Date   HERNIA REPAIR  2004   Patient Active Problem List   Diagnosis Date Noted   Palpitations 05/12/2016     REFERRING PROVIDER: Barnett Abu, MD  REFERRING DIAG: [phone] (ICD-10-CM) - Spinal stenosis, lumbar region without neurogenic claudication  Rationale for Evaluation and Treatment: Rehabilitation  THERAPY DIAG:  Other low back pain  Muscle weakness (generalized)  ONSET DATE: Onset Nov 2021 ; PT order 06/25/2022  SUBJECTIVE:                                                                                                                                                                                           SUBJECTIVE STATEMENT: "Had some tingling down both legs yesterday and some this morning.  May have overdid went to wedding in [place]."    Pt reports his pain began in November 2021 with no specific injury.  He does remember picking up some heavy tiles a few months prior though doesn't remember that causing pain.  Pt saw MD and  had an injection which only lasted a couple of weeks.  Pt had PT in 2022 in [place] and didn't receive much relief.  He went to see a PT in [place] for 1 visit.  Pt states PT was focused on extension in [place] which didn't help and the PT in [place] had exercises that didn't involve extension.  Pt thinks the PT in [place] did help.  Pt states he  has been having lumbar injections every 3 months which provide significant relief though the last injection in early November didn't last as long.  PT order on 06/25/22 indicated both land and aquatic PT.  Pt is a member here at [employer] and tries to come 3x/wk.  Pt does his PT stretches daily.  Pt walks around the track for 1/2 mile and also performs resistance training.    His pain worsens toward the end of the 3 months after the injections.  Pt has pain with extension movements.  Pt has soreness first thing in AM and also late in the day.  Pt does have some tingling in L foot.  His R LE bothers him the most typically, but the L LE bothered him more after the last injection.  Pain can limit his work activities, but the injection improves sx's.  He has difficulty with bed mobility.  Pt reports the stretches help.  He has pain with sitting especially performing computer work.  He uses ice in sitting which helps.      PERTINENT HISTORY:  Grade 1 anterolisthesis L3-L4 and L4-5  PAIN:  NPRS:  3/10 current, 8-9/10 worst, 0-1/10 best Location:  primarily central lumbar, bilat lumbar flanks, bilat post hip, and some down lateral LE's  PRECAUTIONS: Other: MRI findings  WEIGHT BEARING RESTRICTIONS: No  FALLS:  Has patient fallen in last 6 months? 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   PLOF: Independent.  Pt has had pain for 2 years which has affected his daily activities and tolerance to activityy.   PATIENT GOALS: To be able to go longer than 3 months between injections.  To improve core strength.  To  improve pain.    OBJECTIVE:   DIAGNOSTIC FINDINGS:  X rays in 2021:  Lumbar:  Well-preserved lumbar lordosis and intervertebral disc spaces.  Moderate  to severe lumbar spondylosis   Lumbar MRI in 2022: INDINGS:  #  Grade 1 anterolisthesis L3-4 and L4-5.  #  Vertebral body heights are well maintained.  #  Small meningioma within L2 and L4.  #  Conus terminates at T12-L1 without evidence of tethering.  #  Nerve roots appear normal.  #  Incidental findings: None.     #  L1-2: Normal.  #    #  L2-3: Normal.  #    #  L3-4: Mild degenerative disc disease. Grade 1 anterolisthesis. There is facet arthropathy and disc bulge causing moderate central canal stenosis with mild bilateral neural foraminal narrowing.  #    #  L4-5: Mild degenerative disc disease. Grade 1 anterolisthesis. Facet arthropathy and disc bulge causing moderate to severe central canal stenosis and mild bilateral neural foraminal narrowing.  #    #  L5-S1: Facet arthropathy and disc bulge without significant central canal stenosis or neuroforaminal narrowing.     IMPRESSION:    There is facet arthropathy and disc bulge as well as grade 1 anterolisthesis causing moderate to severe central canal stenosis at the level of L4-5 and moderate canal stenosis at L3-4.   PATIENT SURVEYS:  51 % risk adjusted with goal of 70  SCREENING FOR RED FLAGS: Bowel or bladder incontinence: No Spinal tumors: No Cauda equina syndrome: No Compression fracture: No Abdominal aneurysm: No  COGNITION: Overall cognitive status: Within functional limits for tasks assessed       LUMBAR ROM:   AROM eval  Flexion WFL  Extension NT  Right lateral flexion  WFL  Left lateral flexion WFL  Right rotation WFL  Left rotation 75% with pain   (Blank rows = not tested)  LOWER EXTREMITY ROM:     Active  Right eval Left eval  Hip flexion    Hip extension    Hip abduction [facility]  Hip adduction    Hip internal rotation    Hip external  rotation    Knee flexion    Knee extension [facility]  Ankle dorsiflexion [facility]  Ankle plantarflexion [facility] WFL  Ankle inversion    Ankle eversion     (Blank rows = not tested)  LOWER EXTREMITY MMT:    MMT Right eval Left eval  Hip flexion 4+/5 4+/5  Hip extension    Hip abduction 4+/5 4/5  Hip adduction    Hip internal rotation    Hip external rotation 4/5 4+/5  Knee flexion 5/5 seated 5/5 seated  Knee extension 5/5 5/5  Ankle dorsiflexion 5/5 5/5  Ankle plantarflexion WFL seated  WFL seated  Ankle inversion    Ankle eversion     (Blank rows = not tested)   GAIT: Assistive device utilized: None Level of assistance: Complete Independence Comments: Pt ambulates with a normalized heel to toe gait pattern without limping.   TODAY'S TREATMENT:                                                                                                                               Pt seen for aquatic therapy today.  Treatment took place in water 3.25-4.5 ft in depth at the [facility]. Temp of water was 91.  Pt entered/exited the pool via stairs using alternating pattern with hand rail.   *Walking forward, back and side stepping  *Stretching: L stretch               *Side Lunges ue with yellow hand buoys add/abd  *Hip hinge: for glute and hamstring engagement using wall for target point for proper execution  *Core engagement for strengthening and proprioceptive training: hand bells ue oscillations in  sagittal and frontslplanes feet staggered then   wide stance  3 sets of fast/slow 15s in each position.   *KB row feet staggered then wide stance 2x15-20 reps in ea position. Cues for increasing speed.   -resisted lateral rotation R/L  *cycling on noodle; add/abd  Pt requires the buoyancy and hydrostatic pressure of water for support, and to offload joints by unweighting joint load by at least 50 % in navel deep water and by at least 75-80% in chest to neck deep water.   Viscosity of the water is needed for resistance of strengthening. Water current perturbations provides challenge to standing balance requiring increased core activation.      PATIENT EDUCATION:  Education details:  PT used a spine model to educate pt concerning dx, anatomy, and spondylolisthesis.  Educated pt in proper posture but to avoid extension movements/exercises due to  spondylolisthesis.  Educated pt in anterolisthesis precautions and rationale.  POC and rationale of exercises.  Person educated: Patient Education method: Medical illustrator Education comprehension: verbalized understanding and needs further education  HOME EXERCISE PROGRAM:  Pt has a HEP  Added aquatics: Access Code: [email or web address] URL: [email or web address] Date: 07/30/2022 Prepared by: Geni Bers  Exercises - Kettlebell Suitcase Carry  - 1 x daily - 7 x weekly - 3 sets - 10 reps - Bilateral Shoulder Horizontal Abduction Adduction AROM  - 1 x daily - 7 x weekly - 3 sets - 10 reps - Standing Hip Hinge  - 1 x daily - 7 x weekly - 3 sets - 10 reps - Standing Bilateral Low Shoulder Row with Anchored Resistance  - 1 x daily - 7 x weekly - 3 sets - 10 reps - Standing Low Shoulder Row with Anchored Resistance  - 1 x daily - 7 x weekly - 3 sets - 10 reps - Seated Straddle on Flotation Forward Breast Stroke Arms and Bicycle Legs  - 1 x daily - 7 x weekly - 3 sets - 10 reps  ASSESSMENT:  CLINICAL IMPRESSION: Pt reports new symptom of tingling from his hip to his foot which last a few hours yesterday and then again this am. He states he did his home stretching this morning and it subsided and has not returned.  He did call Dr Danielle Dess and left message. Pt states he always feels best the day after therapy.  He completes all tasks without difficulty today, no increase in pain sx. Given vc for positioning and maintaining abd bracing throughout for added core engagement. He is due for a steroid injection  in about 3 weeks. He will return to land based therapy next visit. He has been issued a laminated aquatic HEP.       Patient is a 67 y.o. male with a dx of Spinal stenosis, lumbar region without neurogenic claudication presenting to the clinic with LBP and LE muscle weakness.  Pt has good lumbar AROM t/o except minimal limitation with L rotation.  Pt has been receiving lumbar injections every 3 months which typically provide significant relief though the last injection didn't last as long.  Pt has pain with extension movements.  Pain can limit his work activities, but the injection has improved sx's.  He has pain with sitting especially performing computer work.  He also has difficulty with bed mobility.  Pt has a stretching program that he regularly performs which helps.  Pt should benefit from skilled PT services to address impairments and improve overall function.       OBJECTIVE IMPAIRMENTS: decreased activity tolerance, decreased ROM, decreased strength, hypomobility, and pain.   ACTIVITY LIMITATIONS: lifting, sitting, and bed mobility  PARTICIPATION LIMITATIONS: occupation  PERSONAL FACTORS: Time since onset of injury/illness/exacerbation are also affecting patient's functional outcome.   REHAB POTENTIAL: Good  CLINICAL DECISION MAKING: Stable/uncomplicated  EVALUATION COMPLEXITY: Low   GOALS:   SHORT TERM GOALS: Target date: 08/11/2022   Pt will tolerate aquatic therapy without adverse effects for improved tolerance to activity, core strength, and pain.  Baseline: Goal status: INITIAL  2.  Pt will demo L rotation AROM to be [facility] for improved stiffness and mobility.  Baseline:  Goal status: INITIAL  3.  Pt will report at least a 25% improvement in pain and sx's overall.   Baseline:  Goal status: INITIAL    LONG TERM GOALS: Target date: 09/01/2022  Pt will demo improved hip strength to  5/5 bilat and improved core strength as evidenced by performance of core exercises  without adverse effects for improved performance of daily activities and fxnl mobility with less pain.  Baseline:  Goal status: INITIAL  2.  Pt will be able to perform bed mobility without increased pain and difficulty.  Baseline:  Goal status: INITIAL  3.  Pt will be able to perform work activities without significant pain and limitation.   Baseline:  Goal status: INITIAL  4.  Pt will be independent with aquatic program and land based HEP for improved pain, strength, ROM, and function.  Baseline:  Goal status: INITIAL    PLAN:  PT FREQUENCY: 2x/week  PT DURATION: 6 weeks  PLANNED INTERVENTIONS: Therapeutic exercises, Therapeutic activity, Neuromuscular re-education, Gait training, Patient/Family education, Self Care, Joint mobilization, Stair training, Aquatic Therapy, Dry Needling, Electrical stimulation, Spinal mobilization, Cryotherapy, Moist heat, Taping, Ultrasound, Manual therapy, and Re-evaluation.  PLAN FOR NEXT SESSION:  Assess tenderness to palpation and soft tissue tightness in lumbar.  Assess HS flexibility.  Core strengthening.  Alternate land and aquatic therapy.    Corrie Dandy [facility]) [place] MPT 08/10/22 545p

## 2022-08-13 ENCOUNTER — Encounter (HOSPITAL_BASED_OUTPATIENT_CLINIC_OR_DEPARTMENT_OTHER): Payer: Self-pay | Admitting: Physical Therapy

## 2022-08-13 DIAGNOSIS — M4316 Spondylolisthesis, lumbar region: Secondary | ICD-10-CM | POA: Diagnosis not present

## 2022-08-13 DIAGNOSIS — M48061 Spinal stenosis, lumbar region without neurogenic claudication: Secondary | ICD-10-CM | POA: Diagnosis not present

## 2022-08-13 DIAGNOSIS — Z6829 Body mass index (BMI) 29.0-29.9, adult: Secondary | ICD-10-CM | POA: Diagnosis not present

## 2022-08-18 ENCOUNTER — Encounter (HOSPITAL_BASED_OUTPATIENT_CLINIC_OR_DEPARTMENT_OTHER): Payer: 59 | Admitting: Physical Therapy

## 2022-08-20 ENCOUNTER — Ambulatory Visit (HOSPITAL_BASED_OUTPATIENT_CLINIC_OR_DEPARTMENT_OTHER): Payer: 59 | Admitting: Physical Therapy

## 2022-08-24 ENCOUNTER — Ambulatory Visit: Payer: 59 | Admitting: Psychiatry

## 2022-08-25 ENCOUNTER — Encounter (HOSPITAL_BASED_OUTPATIENT_CLINIC_OR_DEPARTMENT_OTHER): Payer: 59 | Admitting: Physical Therapy

## 2022-08-27 ENCOUNTER — Ambulatory Visit (HOSPITAL_BASED_OUTPATIENT_CLINIC_OR_DEPARTMENT_OTHER): Payer: 59 | Admitting: Physical Therapy

## 2022-08-31 NOTE — Therapy (Signed)
OUTPATIENT PHYSICAL THERAPY THORACOLUMBAR PROGRESS NOTE   Patient Name: Dustin Rice MRN: [id number] DOB:08-25-1955, 67 y.o., male Today's Date: 09/02/2022  END OF SESSION:  PT End of Session - 09/01/22 1531     Visit Number 6    Number of Visits 12    Date for PT Re-Evaluation 09/01/22    Authorization Type AETNA    PT Start Time 1524    PT Stop Time 1558    PT Time Calculation (min) 34 min    Activity Tolerance Patient tolerated treatment well    Behavior During Therapy [facility] for tasks assessed/performed                Past Medical History:  Diagnosis Date   Allergic rhinitis    Cardiovascular risk factor 08/2013   10 YEARS   Cervical spine disease    Dyspnea    Esophageal reflux    GERD (gastroesophageal reflux disease)    Gilbert's syndrome    Hypercholesterolemia    Hyperlipidemia    Inguinal hernia    RIGHT   Palpitations    Prostatitis    RBBB    Shingles 03/2014   Past Surgical History:  Procedure Laterality Date   HERNIA REPAIR  2004   Patient Active Problem List   Diagnosis Date Noted   Palpitations 05/12/2016     REFERRING PROVIDER: Barnett Abu, MD  REFERRING DIAG: [phone] (ICD-10-CM) - Spinal stenosis, lumbar region without neurogenic claudication  Rationale for Evaluation and Treatment: Rehabilitation  THERAPY DIAG:  Other low back pain  Muscle weakness (generalized)  ONSET DATE: Onset Nov 2021 ; PT order 06/25/2022  SUBJECTIVE:                                                                                                                                                                                           SUBJECTIVE STATEMENT: Pt states his sx's were improving and he had improved strength with aquatic therapy.  He typically has soreness the evening after Rx and felt fine the following day.  Pt was having some tingling down both LE's and pain in lumbar/bilat post hips.  Pt went to see MD on 1/5 and had x rays.  Pt states  MD wants to give pt an injection.  Pt tested positive for Covid and hasn't been to PT recently due to Covid.  Pt is planning on receiving the injection next Thursday.  Pt has been wearing a brace that people wear after hernia surgery.  Pt reports 1-2/10 R sided lumbar pain.  If he stands 10-15 mins, his pain and N/T increase.  Pt states his R LE  sx's are worse than L LE.  Pt uses heat which helps including heated car seats.  Pt has improved pain with lying down in heating pad.  Pt hasn't been performing any aquatic exercises recently.       PERTINENT HISTORY:  Grade 1 anterolisthesis L3-L4 and L4-5  PAIN:  NPRS:  3/10 current, 8-9/10 worst, 0-1/10 best Location:  primarily central lumbar, bilat lumbar flanks, bilat post hip, and some down lateral LE's  PRECAUTIONS: Other: MRI findings  WEIGHT BEARING RESTRICTIONS: No  FALLS:  Has patient fallen in last 6 months? 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   PLOF: Independent.  Pt has had pain for 2 years which has affected his daily activities and tolerance to activityy.   PATIENT GOALS: To be able to go longer than 3 months between injections.  To improve core strength.  To improve pain.    OBJECTIVE:   DIAGNOSTIC FINDINGS:  X rays in 2021:  Lumbar:  Well-preserved lumbar lordosis and intervertebral disc spaces.  Moderate  to severe lumbar spondylosis   Lumbar MRI in 2022: INDINGS:  #  Grade 1 anterolisthesis L3-4 and L4-5.  #  Vertebral body heights are well maintained.  #  Small meningioma within L2 and L4.  #  Conus terminates at T12-L1 without evidence of tethering.  #  Nerve roots appear normal.  #  Incidental findings: None.     #  L1-2: Normal.  #    #  L2-3: Normal.  #    #  L3-4: Mild degenerative disc disease. Grade 1 anterolisthesis. There is facet arthropathy and disc bulge causing moderate central canal stenosis with mild bilateral neural foraminal narrowing.  #    #  L4-5:  Mild degenerative disc disease. Grade 1 anterolisthesis. Facet arthropathy and disc bulge causing moderate to severe central canal stenosis and mild bilateral neural foraminal narrowing.  #    #  L5-S1: Facet arthropathy and disc bulge without significant central canal stenosis or neuroforaminal narrowing.     IMPRESSION:    There is facet arthropathy and disc bulge as well as grade 1 anterolisthesis causing moderate to severe central canal stenosis at the level of L4-5 and moderate canal stenosis at L3-4.    TODAY'S TREATMENT:                                                                                                                                Therapeutic Exercise: Reviewed pt presentation, pain levels, and response to prior Rx.  Pt educated in performance and palpation of TrA contraction.  Pt performed TrA contraction with and without 5 sec hold.  Marching with TrA contraction approx 15 reps Supine heel slides with TrA 2x10 PPT 2x10 Supine manual HS stretch 2x20-30 sec bilat  Pt received a HEP handout and was educated in correct form and appropriate frequency.  Pt instructed he should not  have pain with HEP.    See below for pt education.    PATIENT EDUCATION:  Education details:  PT answered pt's questions including questions involving POC and using a brace for lumbar.  Educated pt in proper posture but to avoid extension movements/exercises due to spondylolisthesis.  Educated pt in anterolisthesis precautions and rationale.  Exercise form and rationale, HEP, and POC.  Person educated: Patient Education method: Customer service manager Education comprehension: verbalized understanding and needs further education  HOME EXERCISE PROGRAM:  Pt has a HEP  Added aquatics: Access Code: [email or web address] URL: [email or web address] Date: 07/30/2022 Prepared by: Denton Meek  Exercises - [place]  - 1 x daily - 7 x weekly - 3 sets - 10 reps -  Bilateral Shoulder Horizontal Abduction Adduction AROM  - 1 x daily - 7 x weekly - 3 sets - 10 reps - Standing Hip Hinge  - 1 x daily - 7 x weekly - 3 sets - 10 reps - Standing Bilateral Low Shoulder Row with Anchored Resistance  - 1 x daily - 7 x weekly - 3 sets - 10 reps - Standing Low Shoulder Row with Anchored Resistance  - 1 x daily - 7 x weekly - 3 sets - 10 reps - Seated Straddle on Flotation Forward Breast Stroke Arms and Bicycle Legs  - 1 x daily - 7 x weekly - 3 sets - 10 reps  LAND HEP: - Supine Transversus Abdominis Bracing - Hands on Stomach  - 2 x daily - 7 x weekly - 2 sets - 10 reps - Supine March  - 1-2 x daily - 7 x weekly - 2 sets - 10 reps - Supine Core Control with Heel Slide  - 1 x daily - 7 x weekly - 2 sets - 10 reps  ASSESSMENT:  CLINICAL IMPRESSION: Pt has been absent from PT due to having Covid.  Pt has recently had increased sx's in LE's, worse on R.  Pt saw MD and is planning to have an injection next Thursday.  Pt returns to PT today for land based therapy.  PT instructed pt in TrA contraction and exercises involving core activation.  He required instruction and cuing for correct form with TrA contraction.  Pt received a HEP handout and demonstrates good understanding.  PT answered Pt's questions satisfactorily.  Pt responded well to Rx having no increased pain or sx's after Rx.  He should benefit from cont skilled PT services to address ongoing goals and improve pain, core strength, and function.        OBJECTIVE IMPAIRMENTS: decreased activity tolerance, decreased ROM, decreased strength, hypomobility, and pain.   ACTIVITY LIMITATIONS: lifting, sitting, and bed mobility  PARTICIPATION LIMITATIONS: occupation  PERSONAL FACTORS: Time since onset of injury/illness/exacerbation are also affecting patient's functional outcome.   REHAB POTENTIAL: Good  CLINICAL DECISION MAKING: Stable/uncomplicated  EVALUATION COMPLEXITY: Low   GOALS:   SHORT TERM GOALS:  Target date: 08/11/2022   Pt will tolerate aquatic therapy without adverse effects for improved tolerance to activity, core strength, and pain.  Baseline: Goal status: INITIAL  2.  Pt will demo L rotation AROM to be [facility] for improved stiffness and mobility.  Baseline:  Goal status: INITIAL  3.  Pt will report at least a 25% improvement in pain and sx's overall.   Baseline:  Goal status: INITIAL    LONG TERM GOALS: Target date: 09/01/2022  Pt will demo improved hip strength to 5/5 bilat and improved core strength as evidenced  by performance of core exercises without adverse effects for improved performance of daily activities and fxnl mobility with less pain.  Baseline:  Goal status: INITIAL  2.  Pt will be able to perform bed mobility without increased pain and difficulty.  Baseline:  Goal status: INITIAL  3.  Pt will be able to perform work activities without significant pain and limitation.   Baseline:  Goal status: INITIAL  4.  Pt will be independent with aquatic program and land based HEP for improved pain, strength, ROM, and function.  Baseline:  Goal status: INITIAL    PLAN:  PT FREQUENCY: 2x/week  PT DURATION: 6 weeks  PLANNED INTERVENTIONS: Therapeutic exercises, Therapeutic activity, Neuromuscular re-education, Gait training, Patient/Family education, Self Care, Joint mobilization, Stair training, Aquatic Therapy, Dry Needling, Electrical stimulation, Spinal mobilization, Cryotherapy, Moist heat, Taping, Ultrasound, Manual therapy, and Re-evaluation.  PLAN FOR NEXT SESSION:  PN/recert next visit.  Assess tenderness to palpation and soft tissue tightness in lumbar.  Assess HS flexibility.  Core strengthening.  Alternate land and aquatic therapy.    Audie Clear III PT, DPT 09/02/22 9:30 PM

## 2022-09-01 ENCOUNTER — Encounter (HOSPITAL_BASED_OUTPATIENT_CLINIC_OR_DEPARTMENT_OTHER): Payer: Self-pay | Admitting: Physical Therapy

## 2022-09-01 ENCOUNTER — Ambulatory Visit (HOSPITAL_BASED_OUTPATIENT_CLINIC_OR_DEPARTMENT_OTHER): Payer: BC Managed Care – PPO | Admitting: Physical Therapy

## 2022-09-01 DIAGNOSIS — M6281 Muscle weakness (generalized): Secondary | ICD-10-CM | POA: Diagnosis not present

## 2022-09-01 DIAGNOSIS — M5459 Other low back pain: Secondary | ICD-10-CM

## 2022-09-03 ENCOUNTER — Ambulatory Visit (HOSPITAL_BASED_OUTPATIENT_CLINIC_OR_DEPARTMENT_OTHER): Payer: BC Managed Care – PPO | Admitting: Physical Therapy

## 2022-09-03 ENCOUNTER — Encounter (HOSPITAL_BASED_OUTPATIENT_CLINIC_OR_DEPARTMENT_OTHER): Payer: Self-pay | Admitting: Physical Therapy

## 2022-09-03 DIAGNOSIS — M5459 Other low back pain: Secondary | ICD-10-CM

## 2022-09-03 DIAGNOSIS — M6281 Muscle weakness (generalized): Secondary | ICD-10-CM | POA: Diagnosis not present

## 2022-09-03 NOTE — Therapy (Signed)
OUTPATIENT PHYSICAL THERAPY THORACOLUMBAR PROGRESS NOTE   Patient Name: Dustin Rice MRN: [id number] DOB:[birth year], 67 y.o., male Today's Date: 09/03/2022  END OF SESSION:  PT End of Session - 09/03/22 1023     Visit Number 7    Number of Visits 17    Date for PT Re-Evaluation 10/15/22    Authorization Type AETNA    PT Start Time 1018    PT Stop Time 1103    PT Time Calculation (min) 45 min    Activity Tolerance Patient tolerated treatment well    Behavior During Therapy [facility] for tasks assessed/performed                Past Medical History:  Diagnosis Date   Allergic rhinitis    Cardiovascular risk factor 08/2013   10 YEARS   Cervical spine disease    Dyspnea    Esophageal reflux    GERD (gastroesophageal reflux disease)    Gilbert's syndrome    Hypercholesterolemia    Hyperlipidemia    Inguinal hernia    RIGHT   Palpitations    Prostatitis    RBBB    Shingles 03/2014   Past Surgical History:  Procedure Laterality Date   HERNIA REPAIR  2004   Patient Active Problem List   Diagnosis Date Noted   Palpitations 05/12/2016     REFERRING PROVIDER: Kristeen Miss, MD  REFERRING DIAG: [phone] (ICD-10-CM) - Spinal stenosis, lumbar region without neurogenic claudication  Rationale for Evaluation and Treatment: Rehabilitation  THERAPY DIAG:  Other low back pain  Muscle weakness (generalized)  ONSET DATE: Onset Nov 2021 ; PT order 06/25/2022  SUBJECTIVE:                                                                                                                                                                                           SUBJECTIVE STATEMENT: Pt states his sx's were improving and he had improved core and glute strength with aquatic therapy.  Pt was having some tingling down both LE's and pain in lumbar/bilat post hips.  He saw MD and plans to have an injection next Thursday.  If he stands 10-15 mins, his pain and N/T increase.  Pt states  his pain is worse in R LE sx's are numbness is about the same in R and L LE, possibly worse in L.  He is limited with standing to cook due to sx's.    Pt states he hasn't worn the binder since he left PT.  He has pain with standing which improves when sits or lies down.   Pt uses heat which helps including heated car  seats.  Pt has improved pain with lying down in heating pad.  He is not having as much pain sitting doing computer work.  Pt denies any adverse effects after prior Rx.  Pt took his pain meds this AM.   PERTINENT HISTORY:  Grade 1 anterolisthesis L3-L4 and L4-5  PAIN:  NPRS:  3/10 current, 8-9/10 worst, 0-1/10 best Location:  primarily central lumbar, bilat lumbar flanks, bilat post hip, and some down lateral LE's  PRECAUTIONS: Other: MRI findings  WEIGHT BEARING RESTRICTIONS: No  FALLS:  Has patient fallen in last 6 months? 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   PLOF: Independent.  Pt has had pain for 2 years which has affected his daily activities and tolerance to activityy.   PATIENT GOALS: To be able to go longer than 3 months between injections.  To improve core strength.  To improve pain.    OBJECTIVE:   DIAGNOSTIC FINDINGS:  X rays in 2021:  Lumbar:  Well-preserved lumbar lordosis and intervertebral disc spaces.  Moderate  to severe lumbar spondylosis   Lumbar MRI in 2022: INDINGS:  #  Grade 1 anterolisthesis L3-4 and L4-5.  #  Vertebral body heights are well maintained.  #  Small meningioma within L2 and L4.  #  Conus terminates at T12-L1 without evidence of tethering.  #  Nerve roots appear normal.  #  Incidental findings: None.     #  L1-2: Normal.  #    #  L2-3: Normal.  #    #  L3-4: Mild degenerative disc disease. Grade 1 anterolisthesis. There is facet arthropathy and disc bulge causing moderate central canal stenosis with mild bilateral neural foraminal narrowing.  #    #  L4-5: Mild degenerative disc  disease. Grade 1 anterolisthesis. Facet arthropathy and disc bulge causing moderate to severe central canal stenosis and mild bilateral neural foraminal narrowing.  #    #  L5-S1: Facet arthropathy and disc bulge without significant central canal stenosis or neuroforaminal narrowing.     IMPRESSION:    There is facet arthropathy and disc bulge as well as grade 1 anterolisthesis causing moderate to severe central canal stenosis at the level of L4-5 and moderate canal stenosis at L3-4.    TODAY'S TREATMENT:                                                                                                                                Therapeutic Exercise: -Reviewed pt presentation, current function, pain levels, and response to prior Rx.  LUMBAR ROM:    AROM eval  Flexion WFL  Extension NT  Right lateral flexion WFL  Left lateral flexion WFL  Right rotation 75% with pain  Left rotation 75% with pain   LE strength: Hip flexion:  4+/5 bilat   Hip ER:  R:  5/5, L:  4+/5  PATIENT SURVEYS:  FOTO:  Initial/Current:  53/49.  goal of 62  Reviewed his current HEP.  Pt performed: Supine piriformis stretch 2x30 sec bilat Supine lumbar rotation x 10 reps Pt educated in performance and palpation of TrA contraction.  Pt performed TrA contraction with and without 5 sec hold.  Marching with TrA contraction approx 15 reps PPT 2x10  Manual Therapy:  Pt had no tenderness or significant tightness in R sided lumbar paraspinals.  He did have some tenderness with R palpation of R glute.  Pt received STM to R glute in prone.      See below for pt education.    PATIENT EDUCATION:  Education details:  Objective findings, Exercise form and rationale, HEP, and POC.  Person educated: Patient Education method: Medical illustrator Education comprehension: verbalized understanding and needs further education  HOME EXERCISE PROGRAM:  Pt has a HEP  Added aquatics: Access Code:  [email or web address] URL: [email or web address] Date: 07/30/2022 Prepared by: Geni Bers  Exercises - Kettlebell Suitcase Carry  - 1 x daily - 7 x weekly - 3 sets - 10 reps - Bilateral Shoulder Horizontal Abduction Adduction AROM  - 1 x daily - 7 x weekly - 3 sets - 10 reps - Standing Hip Hinge  - 1 x daily - 7 x weekly - 3 sets - 10 reps - Standing Bilateral Low Shoulder Row with Anchored Resistance  - 1 x daily - 7 x weekly - 3 sets - 10 reps - Standing Low Shoulder Row with Anchored Resistance  - 1 x daily - 7 x weekly - 3 sets - 10 reps - Seated Straddle on Flotation Forward Breast Stroke Arms and Bicycle Legs  - 1 x daily - 7 x weekly - 3 sets - 10 reps  LAND HEP: - Supine Transversus Abdominis Bracing - Hands on Stomach  - 2 x daily - 7 x weekly - 2 sets - 10 reps - Supine March  - 1-2 x daily - 7 x weekly - 2 sets - 10 reps - Supine Core Control with Heel Slide  - 1 x daily - 7 x weekly - 2 sets - 10 reps  ASSESSMENT:  CLINICAL IMPRESSION:  Pt received a HEP handout and demonstrates good understanding.  PT answered Pt's questions satisfactorily.  Pt responded well to Rx having no increased pain or sx's after Rx.  He should benefit from cont skilled PT services to address ongoing goals and improve pain, core strength, and function.  Pt has been limited with PT visits due to having Covid.  Pt states his sx's were improving and he had improved strength with aquatic therapy though has recently had increased sx's in LE's, worse on R.  Pt saw MD and is planning to have an injection next Thursday.  Pt is limited with standing duration and performing IADL's due to having increased sx's with standing.  He is not having as much pain sitting doing computer work.  Pt reports improved pain with sitting down or lying down.  Pt demonstrates worse self perceived disability with FOTO score worsening by 4 pts.  PT assessed his lumbar ROM today and he had worse R rotation and was still limited with  L rotation.  Pt had improved R hip ER strength.  PT performed core exercises in supine and pt states he felt a little better after the exercises but could also be from lying down.  Pt responded well to Rx reporting improved pain from 3/10 before Rx to 1-2/10 after Rx.  Pt has  made limited progress toward goals due to recent increased pain and sx's and also not being at PT due to having Covid.  Pt should benefit from cont skilled PT services to address ongoing goals and to restore desired level of function.       OBJECTIVE IMPAIRMENTS: decreased activity tolerance, decreased ROM, decreased strength, hypomobility, and pain.   ACTIVITY LIMITATIONS: lifting, sitting, and bed mobility  PARTICIPATION LIMITATIONS: occupation  PERSONAL FACTORS: Time since onset of injury/illness/exacerbation are also affecting patient's functional outcome.   REHAB POTENTIAL: Good  CLINICAL DECISION MAKING: Stable/uncomplicated  EVALUATION COMPLEXITY: Low   GOALS:   SHORT TERM GOALS: Target date: 08/11/2022   Pt will tolerate aquatic therapy without adverse effects for improved tolerance to activity, core strength, and pain.  Baseline: Goal status: ONGOING  2.  Pt will demo L rotation AROM to be [facility] for improved stiffness and mobility.  Baseline:  Goal status: ONGOING  3.  Pt will report at least a 25% improvement in pain and sx's overall.   Baseline:  Goal status: NOT MET    LONG TERM GOALS: Target date: 10/15/2022   Pt will demo improved hip strength to 5/5 bilat and improved core strength as evidenced by performance of core exercises without adverse effects for improved performance of daily activities and fxnl mobility with less pain.  Baseline:  Goal status: PROGRESSING  2.  Pt will be able to perform bed mobility without increased pain and difficulty.  Baseline:  Goal status: ONGOING  3.  Pt will be able to perform work activities without significant pain and limitation.   Baseline:  Goal  status: PROGRESSING  4.  Pt will be independent with aquatic program and land based HEP for improved pain, strength, ROM, and function.  Baseline:  Goal status: ONGOING    PLAN:  PT FREQUENCY: 2x/week  PT DURATION: 5-6 weeks  PLANNED INTERVENTIONS: Therapeutic exercises, Therapeutic activity, Neuromuscular re-education, Gait training, Patient/Family education, Self Care, Joint mobilization, Stair training, Aquatic Therapy, Dry Needling, Electrical stimulation, Spinal mobilization, Cryotherapy, Moist heat, Taping, Ultrasound, Manual therapy, and Re-evaluation.  PLAN FOR NEXT SESSION:  Recert completed.  Cont with STW to glute and core strengthening.  Alternate land and aquatic therapy.    Audie Clear III PT, DPT 09/03/22 4:14 PM

## 2022-09-08 ENCOUNTER — Ambulatory Visit (HOSPITAL_BASED_OUTPATIENT_CLINIC_OR_DEPARTMENT_OTHER): Payer: BC Managed Care – PPO | Admitting: Physical Therapy

## 2022-09-08 ENCOUNTER — Encounter (HOSPITAL_BASED_OUTPATIENT_CLINIC_OR_DEPARTMENT_OTHER): Payer: Self-pay | Admitting: Physical Therapy

## 2022-09-08 DIAGNOSIS — L820 Inflamed seborrheic keratosis: Secondary | ICD-10-CM | POA: Diagnosis not present

## 2022-09-08 DIAGNOSIS — M5459 Other low back pain: Secondary | ICD-10-CM | POA: Diagnosis not present

## 2022-09-08 DIAGNOSIS — M6281 Muscle weakness (generalized): Secondary | ICD-10-CM

## 2022-09-08 DIAGNOSIS — L309 Dermatitis, unspecified: Secondary | ICD-10-CM | POA: Diagnosis not present

## 2022-09-08 NOTE — Therapy (Signed)
OUTPATIENT PHYSICAL THERAPY THORACOLUMBAR PROGRESS NOTE   Patient Name: Dustin Rice MRN: [id number] DOB:12-Oct-1955, 67 y.o., male Today's Date: 09/08/2022  END OF SESSION:  PT End of Session - 09/08/22 1541     Visit Number 8    Number of Visits 17    Date for PT Re-Evaluation 10/15/22    Authorization Type AETNA    PT Start Time 1524    PT Stop Time 9485    PT Time Calculation (min) 39 min    Activity Tolerance Patient tolerated treatment well    Behavior During Therapy [facility] for tasks assessed/performed                 Past Medical History:  Diagnosis Date   Allergic rhinitis    Cardiovascular risk factor 08/2013   10 YEARS   Cervical spine disease    Dyspnea    Esophageal reflux    GERD (gastroesophageal reflux disease)    Gilbert's syndrome    Hypercholesterolemia    Hyperlipidemia    Inguinal hernia    RIGHT   Palpitations    Prostatitis    RBBB    Shingles 03/2014   Past Surgical History:  Procedure Laterality Date   HERNIA REPAIR  2004   Patient Active Problem List   Diagnosis Date Noted   Palpitations 05/12/2016     REFERRING PROVIDER: Kristeen Miss, MD  REFERRING DIAG: [phone] (ICD-10-CM) - Spinal stenosis, lumbar region without neurogenic claudication  Rationale for Evaluation and Treatment: Rehabilitation  THERAPY DIAG:  Other low back pain  Muscle weakness (generalized)  ONSET DATE: Onset Nov 2021 ; PT order 06/25/2022  SUBJECTIVE:                                                                                                                                                                                           SUBJECTIVE STATEMENT: Pt has been using a binder brace for his lumbar and states he wouldn't be able to go to work if he didn't have the binder brace.  Pt continues to have pain and sx's.  Pt has been using Tramadol, tylenol, and heat.  Pt has a lumbar injection tomorrow.  Pt has increased sx's with 10-15 mins of  standing if he doesn't use the binder and meds.  Pt has tingling in bilat feet after standing to take a shower.  Pt reports compliance with HEP.  Pt had PT last Friday and reports having increased pain on Saturday though states it could have been anything.  Pt had to use ladders at work today.        PERTINENT HISTORY:  Grade  1 anterolisthesis L3-L4 and L4-5  PAIN:  NPRS:  2/10 current, 8-9/10 worst, 0-1/10 best Location:  R sided lower lumbar flank, tingling in toes.  Pt states if he leans backward just a little bit, his tingling increases.  PRECAUTIONS: Other: MRI findings  WEIGHT BEARING RESTRICTIONS: No  FALLS:  Has patient fallen in last 6 months? 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   PLOF: Independent.  Pt has had pain for 2 years which has affected his daily activities and tolerance to activityy.   PATIENT GOALS: To be able to go longer than 3 months between injections.  To improve core strength.  To improve pain.    OBJECTIVE:   DIAGNOSTIC FINDINGS:  X rays in 2021:  Lumbar:  Well-preserved lumbar lordosis and intervertebral disc spaces.  Moderate  to severe lumbar spondylosis   Lumbar MRI in 2022: INDINGS:  #  Grade 1 anterolisthesis L3-4 and L4-5.  #  Vertebral body heights are well maintained.  #  Small meningioma within L2 and L4.  #  Conus terminates at T12-L1 without evidence of tethering.  #  Nerve roots appear normal.  #  Incidental findings: None.     #  L1-2: Normal.  #    #  L2-3: Normal.  #    #  L3-4: Mild degenerative disc disease. Grade 1 anterolisthesis. There is facet arthropathy and disc bulge causing moderate central canal stenosis with mild bilateral neural foraminal narrowing.  #    #  L4-5: Mild degenerative disc disease. Grade 1 anterolisthesis. Facet arthropathy and disc bulge causing moderate to severe central canal stenosis and mild bilateral neural foraminal narrowing.  #    #  L5-S1: Facet  arthropathy and disc bulge without significant central canal stenosis or neuroforaminal narrowing.     IMPRESSION:    There is facet arthropathy and disc bulge as well as grade 1 anterolisthesis causing moderate to severe central canal stenosis at the level of L4-5 and moderate canal stenosis at L3-4.    TODAY'S TREATMENT:                                                                                                                                Therapeutic Exercise: -Reviewed pt presentation, current function, pain levels, and response to prior Rx.  -Pt performed: Pt educated in performance and palpation of TrA contraction.  Marching with TrA contraction x 15 reps Supine alt LE ext 2x10-12 with TrA Supine alt UE/LE with TrA 2x10 PPT 2x10 with TA Supine lumbar rotation 2 x 10 reps  Manual Therapy:   Pt received STM to R glute and rolling with the roller in L S/L'ing with pillow b/w knees.      See below for pt education.    PATIENT EDUCATION:  Education details:  PT answered questions.  PT instructed pt in STW to glute.  Exercise form and  rationale, HEP, and POC.  Person educated: Patient Education method: Customer service manager Education comprehension: verbalized understanding and needs further education  HOME EXERCISE PROGRAM:  Pt has a HEP  Added aquatics: Access Code: [email or web address] URL: [email or web address] Date: 07/30/2022 Prepared by: Denton Meek  Exercises - Duarte  - 1 x daily - 7 x weekly - 3 sets - 10 reps - Bilateral Shoulder Horizontal Abduction Adduction AROM  - 1 x daily - 7 x weekly - 3 sets - 10 reps - Standing Hip Hinge  - 1 x daily - 7 x weekly - 3 sets - 10 reps - Standing Bilateral Low Shoulder Row with Anchored Resistance  - 1 x daily - 7 x weekly - 3 sets - 10 reps - Standing Low Shoulder Row with Anchored Resistance  - 1 x daily - 7 x weekly - 3 sets - 10 reps - Seated Straddle on Flotation Forward Breast  Stroke Arms and Bicycle Legs  - 1 x daily - 7 x weekly - 3 sets - 10 reps  LAND HEP: - Supine Transversus Abdominis Bracing - Hands on Stomach  - 2 x daily - 7 x weekly - 2 sets - 10 reps - Supine March  - 1-2 x daily - 7 x weekly - 2 sets - 10 reps - Supine Core Control with Heel Slide  - 1 x daily - 7 x weekly - 2 sets - 10 reps  ASSESSMENT:  CLINICAL IMPRESSION: Pt continues to have pain and sx's.  He is limited with standing tolerance due to having increased sx's including tingling in feet.  Pt is wearing a lumbar brace/binder and taking meds which help.  Pt has significant reduction in sx's with sitting.  Pt is scheduled to have an injection tomorrow.  Pt will ask MD how long he wants pt to hold on PT after the injection.  Pt performed core exercises today and performed exercises well.  He is improving with core strength as evidenced by TrA contraction performance.  Pt had no increase in sx's with Rx.  Pt responded well to Rx stating he felt better after exercises and after MT.  Pt states "that did a lot" after ther ex.     OBJECTIVE IMPAIRMENTS: decreased activity tolerance, decreased ROM, decreased strength, hypomobility, and pain.   ACTIVITY LIMITATIONS: lifting, sitting, and bed mobility  PARTICIPATION LIMITATIONS: occupation  PERSONAL FACTORS: Time since onset of injury/illness/exacerbation are also affecting patient's functional outcome.   REHAB POTENTIAL: Good  CLINICAL DECISION MAKING: Stable/uncomplicated  EVALUATION COMPLEXITY: Low   GOALS:   SHORT TERM GOALS: Target date: 08/11/2022   Pt will tolerate aquatic therapy without adverse effects for improved tolerance to activity, core strength, and pain.  Baseline: Goal status: ONGOING  2.  Pt will demo L rotation AROM to be [facility] for improved stiffness and mobility.  Baseline:  Goal status: ONGOING  3.  Pt will report at least a 25% improvement in pain and sx's overall.   Baseline:  Goal status: NOT MET    LONG  TERM GOALS: Target date: 10/15/2022   Pt will demo improved hip strength to 5/5 bilat and improved core strength as evidenced by performance of core exercises without adverse effects for improved performance of daily activities and fxnl mobility with less pain.  Baseline:  Goal status: PROGRESSING  2.  Pt will be able to perform bed mobility without increased pain and difficulty.  Baseline:  Goal status: ONGOING  3.  Pt will be  able to perform work activities without significant pain and limitation.   Baseline:  Goal status: PROGRESSING  4.  Pt will be independent with aquatic program and land based HEP for improved pain, strength, ROM, and function.  Baseline:  Goal status: ONGOING    PLAN:  PT FREQUENCY: 2x/week  PT DURATION: 5-6 weeks  PLANNED INTERVENTIONS: Therapeutic exercises, Therapeutic activity, Neuromuscular re-education, Gait training, Patient/Family education, Self Care, Joint mobilization, Stair training, Aquatic Therapy, Dry Needling, Electrical stimulation, Spinal mobilization, Cryotherapy, Moist heat, Taping, Ultrasound, Manual therapy, and Re-evaluation.  PLAN FOR NEXT SESSION:  Cont with STW to glute and core strengthening.  Focusing on land therapy currently.    Audie Clear III PT, DPT 09/08/22 10:37 PM

## 2022-09-09 DIAGNOSIS — M5416 Radiculopathy, lumbar region: Secondary | ICD-10-CM | POA: Diagnosis not present

## 2022-09-09 DIAGNOSIS — M5116 Intervertebral disc disorders with radiculopathy, lumbar region: Secondary | ICD-10-CM | POA: Diagnosis not present

## 2022-09-10 ENCOUNTER — Ambulatory Visit (HOSPITAL_BASED_OUTPATIENT_CLINIC_OR_DEPARTMENT_OTHER): Payer: BC Managed Care – PPO | Admitting: Physical Therapy

## 2022-09-15 ENCOUNTER — Encounter (HOSPITAL_BASED_OUTPATIENT_CLINIC_OR_DEPARTMENT_OTHER): Payer: Self-pay | Admitting: Physical Therapy

## 2022-09-16 ENCOUNTER — Ambulatory Visit (HOSPITAL_BASED_OUTPATIENT_CLINIC_OR_DEPARTMENT_OTHER): Payer: BC Managed Care – PPO | Attending: Neurological Surgery | Admitting: Physical Therapy

## 2022-09-16 ENCOUNTER — Encounter (HOSPITAL_BASED_OUTPATIENT_CLINIC_OR_DEPARTMENT_OTHER): Payer: Self-pay | Admitting: Physical Therapy

## 2022-09-16 DIAGNOSIS — M6281 Muscle weakness (generalized): Secondary | ICD-10-CM | POA: Diagnosis not present

## 2022-09-16 DIAGNOSIS — M5459 Other low back pain: Secondary | ICD-10-CM | POA: Diagnosis not present

## 2022-09-16 NOTE — Therapy (Signed)
OUTPATIENT PHYSICAL THERAPY THORACOLUMBAR PROGRESS NOTE   Patient Name: Dustin Rice MRN: [id number] DOB:November 29, 1955, 67 y.o., male Today's Date: 09/16/2022  END OF SESSION:  PT End of Session - 09/16/22 1036     Visit Number 9    Number of Visits 17    Date for PT Re-Evaluation 10/15/22    Authorization Type AETNA    PT Start Time 1032    PT Stop Time 1115    PT Time Calculation (min) 43 min    Activity Tolerance Patient tolerated treatment well    Behavior During Therapy [facility] for tasks assessed/performed                 Past Medical History:  Diagnosis Date   Allergic rhinitis    Cardiovascular risk factor 08/2013   10 YEARS   Cervical spine disease    Dyspnea    Esophageal reflux    GERD (gastroesophageal reflux disease)    Gilbert's syndrome    Hypercholesterolemia    Hyperlipidemia    Inguinal hernia    RIGHT   Palpitations    Prostatitis    RBBB    Shingles 03/2014   Past Surgical History:  Procedure Laterality Date   HERNIA REPAIR  2004   Patient Active Problem List   Diagnosis Date Noted   Palpitations 05/12/2016     REFERRING PROVIDER: Kristeen Miss, MD  REFERRING DIAG: [phone] (ICD-10-CM) - Spinal stenosis, lumbar region without neurogenic claudication  Rationale for Evaluation and Treatment: Rehabilitation  THERAPY DIAG:  Other low back pain  Muscle weakness (generalized)  ONSET DATE: Onset Nov 2021 ; PT order 06/25/2022  SUBJECTIVE:                                                                                                                                                                                           SUBJECTIVE STATEMENT: "Had injection in back and I am much better, pain low.  Wore binder for the last few days just to ensure injection took"   Pt has been using a binder brace for his lumbar and states he wouldn't be able to go to work if he didn't have the binder brace.  Pt continues to have pain and sx's.  Pt has  been using Tramadol, tylenol, and heat.  Pt has a lumbar injection tomorrow.  Pt has increased sx's with 10-15 mins of standing if he doesn't use the binder and meds.  Pt has tingling in bilat feet after standing to take a shower.  Pt reports compliance with HEP.  Pt had PT last Friday and reports having increased pain on Saturday though  states it could have been anything.  Pt had to use ladders at work today.        PERTINENT HISTORY:  Grade 1 anterolisthesis L3-L4 and L4-5  PAIN:  NPRS:  1/10 current, 8-9/10 worst, 0-1/10 best Location:  R sided lower lumbar flank, tingling in toes.  Pt states if he leans backward just a little bit, his tingling increases.  PRECAUTIONS: Other: MRI findings  WEIGHT BEARING RESTRICTIONS: No  FALLS:  Has patient fallen in last 6 months? 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   PLOF: Independent.  Pt has had pain for 2 years which has affected his daily activities and tolerance to activityy.   PATIENT GOALS: To be able to go longer than 3 months between injections.  To improve core strength.  To improve pain.    OBJECTIVE:   DIAGNOSTIC FINDINGS:  X rays in 2021:  Lumbar:  Well-preserved lumbar lordosis and intervertebral disc spaces.  Moderate  to severe lumbar spondylosis   Lumbar MRI in 2022: INDINGS:  #  Grade 1 anterolisthesis L3-4 and L4-5.  #  Vertebral body heights are well maintained.  #  Small meningioma within L2 and L4.  #  Conus terminates at T12-L1 without evidence of tethering.  #  Nerve roots appear normal.  #  Incidental findings: None.     #  L1-2: Normal.  #    #  L2-3: Normal.  #    #  L3-4: Mild degenerative disc disease. Grade 1 anterolisthesis. There is facet arthropathy and disc bulge causing moderate central canal stenosis with mild bilateral neural foraminal narrowing.  #    #  L4-5: Mild degenerative disc disease. Grade 1 anterolisthesis. Facet arthropathy and disc bulge  causing moderate to severe central canal stenosis and mild bilateral neural foraminal narrowing.  #    #  L5-S1: Facet arthropathy and disc bulge without significant central canal stenosis or neuroforaminal narrowing.     IMPRESSION:    There is facet arthropathy and disc bulge as well as grade 1 anterolisthesis causing moderate to severe central canal stenosis at the level of L4-5 and moderate canal stenosis at L3-4.    TODAY'S TREATMENT:                                                                                                                               Pt seen for aquatic therapy today.  Treatment took place in water 3.25-4.5 ft in depth at the [facility]. Temp of water was 91.  Pt entered/exited the pool via stairs using alternating pattern with hand rail.                 *Walking forward, back and side stepping  *KB row Feet staggered the wide stance  x15.  Advanced to using magic board.  *Noodle push down legs as above               *  Stretching: L stretch               *Side Lunges ue with yellow hand buoys add/abd               *Hip hinge: for glute and hamstring engagement using wall for target point for proper execution  *Core engagement for strengthening and proprioceptive training: hand bells ue oscillations in  sagittal and frontal planes feet staggered then   wide stance  3 sets of fast/slow 15s in each position.               *cycling on noodle; add/abd   Pt requires the buoyancy and hydrostatic pressure of water for support, and to offload joints by unweighting joint load by at least 50 % in navel deep water and by at least 75-80% in chest to neck deep water.  Viscosity of the water is needed for resistance of strengthening. Water current perturbations provides challenge to standing balance requiring increased core activation. Previous Therapeutic Exercise: -Reviewed pt presentation, current function, pain levels, and response to prior Rx.  -Pt  performed: Pt educated in performance and palpation of TrA contraction.  Marching with TrA contraction x 15 reps Supine alt LE ext 2x10-12 with TrA Supine alt UE/LE with TrA 2x10 PPT 2x10 with TA Supine lumbar rotation 2 x 10 reps  Manual Therapy:   Pt received STM to R glute and rolling with the roller in L S/L'ing with pillow b/w knees.      See below for pt education.    PATIENT EDUCATION:  Education details:  PT answered questions.  PT instructed pt in STW to glute.  Exercise form and rationale, HEP, and POC.  Person educated: Patient Education method: Customer service manager Education comprehension: verbalized understanding and needs further education  HOME EXERCISE PROGRAM:  Pt has a HEP  Added aquatics: Access Code: [email or web address] URL: [email or web address] Date: 07/30/2022 Prepared by: Denton Meek  Exercises - [place]  - 1 x daily - 7 x weekly - 3 sets - 10 reps - Bilateral Shoulder Horizontal Abduction Adduction AROM  - 1 x daily - 7 x weekly - 3 sets - 10 reps - Standing Hip Hinge  - 1 x daily - 7 x weekly - 3 sets - 10 reps - Standing Bilateral Low Shoulder Row with Anchored Resistance  - 1 x daily - 7 x weekly - 3 sets - 10 reps - Standing Low Shoulder Row with Anchored Resistance  - 1 x daily - 7 x weekly - 3 sets - 10 reps - Seated Straddle on Flotation Forward Breast Stroke Arms and Bicycle Legs  - 1 x daily - 7 x weekly - 3 sets - 10 reps  LAND HEP: - Supine Transversus Abdominis Bracing - Hands on Stomach  - 2 x daily - 7 x weekly - 2 sets - 10 reps - Supine March  - 1-2 x daily - 7 x weekly - 2 sets - 10 reps - Supine Core Control with Heel Slide  - 1 x daily - 7 x weekly - 2 sets - 10 reps  ASSESSMENT:  CLINICAL IMPRESSION: Pt with injection last week.  Pain is reduced ~75%. He tolerates aquatic session well. Progressed core strengthening. Vc and demonstration for clarity of movement and proper execution for focus  muscle use.  Goals ongoing    OBJECTIVE IMPAIRMENTS: decreased activity tolerance, decreased ROM, decreased strength, hypomobility, and pain.   ACTIVITY LIMITATIONS: lifting, sitting, and bed mobility  PARTICIPATION LIMITATIONS: occupation  PERSONAL FACTORS: Time since onset of injury/illness/exacerbation are also affecting patient's functional outcome.   REHAB POTENTIAL: Good  CLINICAL DECISION MAKING: Stable/uncomplicated  EVALUATION COMPLEXITY: Low   GOALS:   SHORT TERM GOALS: Target date: 08/11/2022   Pt will tolerate aquatic therapy without adverse effects for improved tolerance to activity, core strength, and pain.  Baseline: Goal status: Met 09/16/21  2.  Pt will demo L rotation AROM to be [facility] for improved stiffness and mobility.  Baseline:  Goal status: ONGOING  3.  Pt will report at least a 25% improvement in pain and sx's overall.   Baseline:  Goal status: met 09/16/22     LONG TERM GOALS: Target date: 10/15/2022   Pt will demo improved hip strength to 5/5 bilat and improved core strength as evidenced by performance of core exercises without adverse effects for improved performance of daily activities and fxnl mobility with less pain.  Baseline:  Goal status: PROGRESSING  2.  Pt will be able to perform bed mobility without increased pain and difficulty.  Baseline:  Goal status: ONGOING  3.  Pt will be able to perform work activities without significant pain and limitation.   Baseline:  Goal status: PROGRESSING  4.  Pt will be independent with aquatic program and land based HEP for improved pain, strength, ROM, and function.  Baseline:  Goal status: ONGOING    PLAN:  PT FREQUENCY: 2x/week  PT DURATION: 5-6 weeks  PLANNED INTERVENTIONS: Therapeutic exercises, Therapeutic activity, Neuromuscular re-education, Gait training, Patient/Family education, Self Care, Joint mobilization, Stair training, Aquatic Therapy, Dry Needling, Electrical stimulation,  Spinal mobilization, Cryotherapy, Moist heat, Taping, Ultrasound, Manual therapy, and Re-evaluation.  PLAN FOR NEXT SESSION:  Cont with STW to glute and core strengthening.  Focusing on land therapy currently.    Corrie Dandy [facility]) Deriana Vanderhoef MPT 09/16/22 1038

## 2022-09-20 ENCOUNTER — Ambulatory Visit (INDEPENDENT_AMBULATORY_CARE_PROVIDER_SITE_OTHER): Payer: BC Managed Care – PPO | Admitting: Psychiatry

## 2022-09-20 DIAGNOSIS — F411 Generalized anxiety disorder: Secondary | ICD-10-CM | POA: Diagnosis not present

## 2022-09-20 NOTE — Progress Notes (Signed)
[facility] Counselor/Therapist Progress Note  Patient ID: Dustin Rice, MRN: [id number],    Date: 09/20/2022  Time Spent: 55 minutes  Treatment Type: Individual Therapy  Reported Symptoms:  anxious, frustration, "short fuse" at home mostly with wife  Mental Status Exam:  Appearance:   Casual and Neat     Behavior:  Appropriate, Sharing, and Motivated  Motor:  Normal  Speech/Language:   Clear and Coherent  Affect:  anxious  Mood:  anxious  Thought process:  goal directed  Thought content:    WNL  Sensory/Perceptual disturbances:    WNL  Orientation:  oriented to person, place, time/date, situation, day of week, month of year, year, and stated date of Feb. 12, 2024  Attention:  Good  Concentration:  Good  Memory:  WNL  Fund of knowledge:   Good  Insight:    Good  Judgment:   Good  Impulse Control:  Good   Risk Assessment: Danger to Self:  No Self-injurious Behavior: No Danger to Others: No Duty to Warn:no Physical Aggression / Violence:No  Access to Firearms a concern: No  Gang Involvement:No   Subjective:  Patient in today reporting symptoms of anxiety mostly related to personal and family issues. Concerned about wife's diabetes and got a new monitor that goes on both of their phones that will alert them if she "has a high or low". Some irritability and ineffective communication between patient and diabetic wife and notices it's more of a problem when wife's sugar is elevated. Discussed this more in session today, looking at several of the examples patient provided.  Considering seeing a marital therapist together. Anxiety some higher more recently, but patient states he's learning to be more patient with wife and others. Issues revolving around his elderly mother's care has gotten some better, with family pulling together a little more which is progress. Is doing some physical therapy for his back . Got a recent injection which is helping. Worked with his  communication when under stress, and trying to slow himself down and be more intentional in his communication. Elderly mom doing some better physically after having a broken back.   Interventions: Cognitive Behavioral Therapy and Ego-Supportive  Long term goal: Reduce overall level, frequency, and intensity of anxiety and depression so that daily functioning is not impaired. Short term goal: Increase understanding of beliefs and messages that produce worry and anxiety. Strategies: Identify and use specific coping strategies for anxiety and depression reduction.  Also explore alternative ways of communicating better with spouse and being more sensitive to needs within the family, including both wife and son.  Diagnosis:   ICD-10-CM   1. Generalized anxiety disorder  F41.1      Plan:   Patient today actively participating and showing good motivation as he worked more on his anxiety, frustration, and working together with wife in their relationship, and also working better with siblings in overseeing the care of their mother.  Is more sensitive in his relationship with wife which is a good thing and progress for them.  May actually seek some marital counseling for them together.  Encouraged patient and some of the changes we discussed that might be helpful for him in his relationship especially in demonstrating more patience between him and wife and working on improved listening skills as we discussed in session.  Patient continues to look less stressed than an earlier sessions.  Is showing good motivation and follow-through with goal-directed behaviors, including being able to use  some of them within his marital relationship.  Stress management skills have improved along with more active listening and relationship to others especially within the family. Encouraged patient in practicing positive behaviors as discussed in session including: Staying in the present and focusing on the things he can control  or change versus cannot, staying in touch with supportive people, positive self talk, healthy nutrition and exercise, allowing his faith to be an emotional support as well as spiritual, continue to make his family a high priority, and recognize the strength he shows working with goal-directed behaviors to move in a direction that supports his improved emotional health and outlook.  Goal review and progress/challenges noted with patient.  Next appointment within 3 weeks.  This record has been created using [employer].  Chart creation errors have been sought, but may not always have been located and corrected.  Such creation errors do not reflect on the standard of medical care provided.    Shanon Ace, LCSW

## 2022-09-23 ENCOUNTER — Ambulatory Visit (HOSPITAL_BASED_OUTPATIENT_CLINIC_OR_DEPARTMENT_OTHER): Payer: BC Managed Care – PPO | Admitting: Physical Therapy

## 2022-09-29 ENCOUNTER — Ambulatory Visit (HOSPITAL_BASED_OUTPATIENT_CLINIC_OR_DEPARTMENT_OTHER): Payer: BC Managed Care – PPO | Admitting: Physical Therapy

## 2022-09-29 ENCOUNTER — Encounter (HOSPITAL_BASED_OUTPATIENT_CLINIC_OR_DEPARTMENT_OTHER): Payer: Self-pay | Admitting: Physical Therapy

## 2022-09-29 DIAGNOSIS — M5459 Other low back pain: Secondary | ICD-10-CM | POA: Diagnosis not present

## 2022-09-29 DIAGNOSIS — M6281 Muscle weakness (generalized): Secondary | ICD-10-CM | POA: Diagnosis not present

## 2022-09-29 NOTE — Therapy (Signed)
OUTPATIENT PHYSICAL THERAPY THORACOLUMBAR PROGRESS NOTE   Patient Name: Dustin Rice MRN: [id number] DOB:[birth year], 67 y.o., male Today's Date: 09/30/2022  END OF SESSION:  PT End of Session - 09/29/22 1530     Visit Number 10    Number of Visits 17    Date for PT Re-Evaluation 10/15/22    Authorization Type AETNA    PT Start Time [id number]    PT Stop Time [id number]    PT Time Calculation (min) 34 min    Activity Tolerance Patient tolerated treatment well    Behavior During Therapy [facility] for tasks assessed/performed                 Past Medical History:  Diagnosis Date   Allergic rhinitis    Cardiovascular risk factor 08/2013   10 YEARS   Cervical spine disease    Dyspnea    Esophageal reflux    GERD (gastroesophageal reflux disease)    Gilbert's syndrome    Hypercholesterolemia    Hyperlipidemia    Inguinal hernia    RIGHT   Palpitations    Prostatitis    RBBB    Shingles 03/2014   Past Surgical History:  Procedure Laterality Date   HERNIA REPAIR  2004   Patient Active Problem List   Diagnosis Date Noted   Palpitations 05/12/2016     REFERRING PROVIDER: Kristeen Miss, MD  REFERRING DIAG: [phone] (ICD-10-CM) - Spinal stenosis, lumbar region without neurogenic claudication  Rationale for Evaluation and Treatment: Rehabilitation  THERAPY DIAG:  Other low back pain  Muscle weakness (generalized)  ONSET DATE: Onset Nov 2021 ; PT order 06/25/2022  SUBJECTIVE:                                                                                                                                                                                           SUBJECTIVE STATEMENT: Pt states the injection helped a lot.  Pt feels the pain more in the AM.  "It's night and day since having the injection."  Pain has been a little on both sides though more on R side.  Pt had soreness in bilat lateral hips with walking at a park.  Pt states the pool seems to help a lot.  Pt  states he felt good after the prior aquatic Rx.  He does have soreness after aquatic exercises though states his strength is improving.  Pt reports piriformis stretch can relieve some of his pain in the AM.       PERTINENT HISTORY:  Grade 1 anterolisthesis L3-L4 and L4-5  PAIN:  NPRS:  .5/10 current, 8-9/10 worst, 0-1/10 best Pt  reports 2/10 pain in AM.  Location:  R sided lower lumbar flank, tingling in toes.  Pt states if he leans backward just a little bit, his tingling increases.  PRECAUTIONS: Other: MRI findings  WEIGHT BEARING RESTRICTIONS: No  FALLS:  Has patient fallen in last 6 months? 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   PLOF: Independent.  Pt has had pain for 2 years which has affected his daily activities and tolerance to activityy.   PATIENT GOALS: To be able to go longer than 3 months between injections.  To improve core strength.  To improve pain.    OBJECTIVE:   DIAGNOSTIC FINDINGS:  X rays in 2021:  Lumbar:  Well-preserved lumbar lordosis and intervertebral disc spaces.  Moderate  to severe lumbar spondylosis   Lumbar MRI in 2022: INDINGS:  #  Grade 1 anterolisthesis L3-4 and L4-5.  #  Vertebral body heights are well maintained.  #  Small meningioma within L2 and L4.  #  Conus terminates at T12-L1 without evidence of tethering.  #  Nerve roots appear normal.  #  Incidental findings: None.     #  L1-2: Normal.  #    #  L2-3: Normal.  #    #  L3-4: Mild degenerative disc disease. Grade 1 anterolisthesis. There is facet arthropathy and disc bulge causing moderate central canal stenosis with mild bilateral neural foraminal narrowing.  #    #  L4-5: Mild degenerative disc disease. Grade 1 anterolisthesis. Facet arthropathy and disc bulge causing moderate to severe central canal stenosis and mild bilateral neural foraminal narrowing.  #    #  L5-S1: Facet arthropathy and disc bulge without significant central  canal stenosis or neuroforaminal narrowing.     IMPRESSION:    There is facet arthropathy and disc bulge as well as grade 1 anterolisthesis causing moderate to severe central canal stenosis at the level of L4-5 and moderate canal stenosis at L3-4.    TODAY'S TREATMENT:                                                                                                                                Therapeutic Exercise: -Reviewed pt presentation, current function, pain levels, and response to prior Rx.  -Pt performed:   Supine alt LE ext 2x10 with TrA while holding ball at 90 deg flexion Supine alt UE/LE with TrA 2x10 Supine SLR with TrA x 10 PPT 2x10 with TA Supine clams with PPT 2x10 with GTB Supine lumbar rotation  x 10 reps Standing rows with RTB with TrA 2x10 Supine piriformis stretch 2x20-30 sec Supine manual HS stretch 2x30 sec bilat    See below for pt education.    PATIENT EDUCATION:  Education details:  PT answered questions.  Exercise form and rationale, HEP, and POC.  Person educated: Patient Education method: Customer service manager Education comprehension: verbalized understanding and needs  further education  HOME EXERCISE PROGRAM:  Pt has a HEP  Added aquatics: Access Code: [email or web address] URL: [email or web address] Date: 07/30/2022 Prepared by: Denton Meek  Exercises - [place]  - 1 x daily - 7 x weekly - 3 sets - 10 reps - Bilateral Shoulder Horizontal Abduction Adduction AROM  - 1 x daily - 7 x weekly - 3 sets - 10 reps - Standing Hip Hinge  - 1 x daily - 7 x weekly - 3 sets - 10 reps - Standing Bilateral Low Shoulder Row with Anchored Resistance  - 1 x daily - 7 x weekly - 3 sets - 10 reps - Standing Low Shoulder Row with Anchored Resistance  - 1 x daily - 7 x weekly - 3 sets - 10 reps - Seated Straddle on Flotation Forward Breast Stroke Arms and Bicycle Legs  - 1 x daily - 7 x weekly - 3 sets - 10 reps  LAND HEP: -  Supine Transversus Abdominis Bracing - Hands on Stomach  - 2 x daily - 7 x weekly - 2 sets - 10 reps - Supine March  - 1-2 x daily - 7 x weekly - 2 sets - 10 reps - Supine Core Control with Heel Slide  - 1 x daily - 7 x weekly - 2 sets - 10 reps  ASSESSMENT:  CLINICAL IMPRESSION: Pt presents to Rx stating he has felt better since receiving the injection.  Pt responded well to prior aquatic Rx and presents to PT for a land based Rx today.  PT progressed core exercises and Pt performed exercises well with cuing and instruction in correct form.  He responded well to Rx stating he felt better after Rx than before Rx. continues to have pain and sx's.  He is limited with standing tolerance due to having increased sx's including tingling in feet.    OBJECTIVE IMPAIRMENTS: decreased activity tolerance, decreased ROM, decreased strength, hypomobility, and pain.   ACTIVITY LIMITATIONS: lifting, sitting, and bed mobility  PARTICIPATION LIMITATIONS: occupation  PERSONAL FACTORS: Time since onset of injury/illness/exacerbation are also affecting patient's functional outcome.   REHAB POTENTIAL: Good  CLINICAL DECISION MAKING: Stable/uncomplicated  EVALUATION COMPLEXITY: Low   GOALS:   SHORT TERM GOALS: Target date: 08/11/2022   Pt will tolerate aquatic therapy without adverse effects for improved tolerance to activity, core strength, and pain.  Baseline: Goal status: ONGOING  2.  Pt will demo L rotation AROM to be [facility] for improved stiffness and mobility.  Baseline:  Goal status: ONGOING  3.  Pt will report at least a 25% improvement in pain and sx's overall.   Baseline:  Goal status: NOT MET    LONG TERM GOALS: Target date: 10/15/2022   Pt will demo improved hip strength to 5/5 bilat and improved core strength as evidenced by performance of core exercises without adverse effects for improved performance of daily activities and fxnl mobility with less pain.  Baseline:  Goal status:  PROGRESSING  2.  Pt will be able to perform bed mobility without increased pain and difficulty.  Baseline:  Goal status: ONGOING  3.  Pt will be able to perform work activities without significant pain and limitation.   Baseline:  Goal status: PROGRESSING  4.  Pt will be independent with aquatic program and land based HEP for improved pain, strength, ROM, and function.  Baseline:  Goal status: ONGOING    PLAN:  PT FREQUENCY: 2x/week  PT DURATION: 5-6 weeks  PLANNED INTERVENTIONS: Therapeutic exercises,  Therapeutic activity, Neuromuscular re-education, Gait training, Patient/Family education, Self Care, Joint mobilization, Stair training, Aquatic Therapy, Dry Needling, Electrical stimulation, Spinal mobilization, Cryotherapy, Moist heat, Taping, Ultrasound, Manual therapy, and Re-evaluation.  PLAN FOR NEXT SESSION:  Cont with STW to glute and core strengthening.  Cont with aquatic and land based PT.    Selinda Michaels III PT, DPT 09/30/22 3:09 PM

## 2022-10-01 ENCOUNTER — Ambulatory Visit (HOSPITAL_BASED_OUTPATIENT_CLINIC_OR_DEPARTMENT_OTHER): Payer: BC Managed Care – PPO | Admitting: Physical Therapy

## 2022-10-06 ENCOUNTER — Ambulatory Visit (HOSPITAL_BASED_OUTPATIENT_CLINIC_OR_DEPARTMENT_OTHER): Payer: BC Managed Care – PPO | Admitting: Physical Therapy

## 2022-10-06 DIAGNOSIS — M5459 Other low back pain: Secondary | ICD-10-CM

## 2022-10-06 DIAGNOSIS — M6281 Muscle weakness (generalized): Secondary | ICD-10-CM | POA: Diagnosis not present

## 2022-10-06 NOTE — Therapy (Signed)
OUTPATIENT PHYSICAL THERAPY TREATMENT NOTE   Patient Name: Dustin Rice MRN: [id number] DOB:October 19, 1955, 67 y.o., male Today's Date: 10/07/2022  END OF SESSION:  PT End of Session - 10/07/22 1743     Visit Number 11    Number of Visits 17    Date for PT Re-Evaluation 10/15/22    Authorization Type AETNA    PT Start Time 1524    PT Stop Time [id number]    PT Time Calculation (min) 34 min    Activity Tolerance Patient tolerated treatment well    Behavior During Therapy [facility] for tasks assessed/performed                  Past Medical History:  Diagnosis Date   Allergic rhinitis    Cardiovascular risk factor 08/2013   10 YEARS   Cervical spine disease    Dyspnea    Esophageal reflux    GERD (gastroesophageal reflux disease)    Gilbert's syndrome    Hypercholesterolemia    Hyperlipidemia    Inguinal hernia    RIGHT   Palpitations    Prostatitis    RBBB    Shingles 03/2014   Past Surgical History:  Procedure Laterality Date   HERNIA REPAIR  2004   Patient Active Problem List   Diagnosis Date Noted   Palpitations 05/12/2016     REFERRING PROVIDER: Kristeen Miss, MD  REFERRING DIAG: [phone] (ICD-10-CM) - Spinal stenosis, lumbar region without neurogenic claudication  Rationale for Evaluation and Treatment: Rehabilitation  THERAPY DIAG:  Other low back pain  Muscle weakness (generalized)  ONSET DATE: Onset Nov 2021 ; PT order 06/25/2022  SUBJECTIVE:                                                                                                                                                                                           SUBJECTIVE STATEMENT: Pt reports he felt good after prior Rx.  Pt states he may be feeling better.  "I'm feeling very little pain right now."  Pt states his mornings are the worse, but this morning was much better.  Pt reports compliance with HEP.    PERTINENT HISTORY:  Grade 1 anterolisthesis L3-L4 and L4-5  PAIN:   NPRS:  .5/10 current, 8-9/10 worst, 0-1/10 best Pt reports 2/10 pain in AM.  Location:  R sided lower lumbar flank, tingling in toes.  Pt states if he leans backward just a little bit, his tingling increases.  PRECAUTIONS: Other: MRI findings  WEIGHT BEARING RESTRICTIONS: No  FALLS:  Has patient fallen in last 6 months? 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   PLOF: Independent.  Pt has had pain for 2 years which has affected his daily activities and tolerance to activityy.   PATIENT GOALS: To be able to go longer than 3 months between injections.  To improve core strength.  To improve pain.    OBJECTIVE:   DIAGNOSTIC FINDINGS:  X rays in 2021:  Lumbar:  Well-preserved lumbar lordosis and intervertebral disc spaces.  Moderate  to severe lumbar spondylosis   Lumbar MRI in 2022: INDINGS:  #  Grade 1 anterolisthesis L3-4 and L4-5.  #  Vertebral body heights are well maintained.  #  Small meningioma within L2 and L4.  #  Conus terminates at T12-L1 without evidence of tethering.  #  Nerve roots appear normal.  #  Incidental findings: None.     #  L1-2: Normal.  #    #  L2-3: Normal.  #    #  L3-4: Mild degenerative disc disease. Grade 1 anterolisthesis. There is facet arthropathy and disc bulge causing moderate central canal stenosis with mild bilateral neural foraminal narrowing.  #    #  L4-5: Mild degenerative disc disease. Grade 1 anterolisthesis. Facet arthropathy and disc bulge causing moderate to severe central canal stenosis and mild bilateral neural foraminal narrowing.  #    #  L5-S1: Facet arthropathy and disc bulge without significant central canal stenosis or neuroforaminal narrowing.     IMPRESSION:    There is facet arthropathy and disc bulge as well as grade 1 anterolisthesis causing moderate to severe central canal stenosis at the level of L4-5 and moderate canal stenosis at L3-4.    TODAY'S TREATMENT:                                                                                                                                 Therapeutic Exercise: -Reviewed pt presentation, current function, pain levels, and response to prior Rx.  -Pt performed:  Supine alt LE ext 2x10 with TrA while holding ball at 90 deg flexion Supine alt UE/LE with TrA 2x10 Supine SLR with TrA x 10 and with contralat UE ext x 10 reps PPT x10 with TA Supine clams with PPT 2x10 with GTB Supine bridge with TrA x 10 Supine lumbar rotation  x 10 reps Standing rows with RTB with TrA 2x10 Standing shoulder extension with RTB 2x10 Supine piriformis stretch 2x30 sec Supine manual HS stretch 2x30 sec bilat    See below for pt education.    PATIENT EDUCATION:  Education details:  PT answered questions.  Exercise form and rationale, HEP, and POC.  Person educated: Patient Education method: Customer service manager Education comprehension: verbalized understanding and needs further education  HOME EXERCISE PROGRAM:  Pt has a HEP  Added aquatics: Access Code: [email or web address] URL: [email or web address] Date: 07/30/2022 Prepared by: Denton Meek  Exercises - Kettlebell Suitcase Carry  - 1 x daily - 7 x weekly -  3 sets - 10 reps - Bilateral Shoulder Horizontal Abduction Adduction AROM  - 1 x daily - 7 x weekly - 3 sets - 10 reps - Standing Hip Hinge  - 1 x daily - 7 x weekly - 3 sets - 10 reps - Standing Bilateral Low Shoulder Row with Anchored Resistance  - 1 x daily - 7 x weekly - 3 sets - 10 reps - Standing Low Shoulder Row with Anchored Resistance  - 1 x daily - 7 x weekly - 3 sets - 10 reps - Seated Straddle on Flotation Forward Breast Stroke Arms and Bicycle Legs  - 1 x daily - 7 x weekly - 3 sets - 10 reps  LAND HEP: - Supine Transversus Abdominis Bracing - Hands on Stomach  - 2 x daily - 7 x weekly - 2 sets - 10 reps - Supine March  - 1-2 x daily - 7 x weekly - 2 sets - 10 reps - Supine Core Control  with Heel Slide  - 1 x daily - 7 x weekly - 2 sets - 10 reps  ASSESSMENT:  CLINICAL IMPRESSION: Pt continues to be feeling better overall since receiving the injection having less pain.  Pt performed core and LE flexibility exercises today well with cuing and instruction in correct form.  He responded well to Rx stating he felt better after Rx having no change in pain.  Pt should benefit from continued skilled PT services to address ongoing goals, improve pain, and to assist in restoring desired level of function.      OBJECTIVE IMPAIRMENTS: decreased activity tolerance, decreased ROM, decreased strength, hypomobility, and pain.   ACTIVITY LIMITATIONS: lifting, sitting, and bed mobility  PARTICIPATION LIMITATIONS: occupation  PERSONAL FACTORS: Time since onset of injury/illness/exacerbation are also affecting patient's functional outcome.   REHAB POTENTIAL: Good  CLINICAL DECISION MAKING: Stable/uncomplicated  EVALUATION COMPLEXITY: Low   GOALS:   SHORT TERM GOALS: Target date: 08/11/2022   Pt will tolerate aquatic therapy without adverse effects for improved tolerance to activity, core strength, and pain.  Baseline: Goal status: ONGOING  2.  Pt will demo L rotation AROM to be [facility] for improved stiffness and mobility.  Baseline:  Goal status: ONGOING  3.  Pt will report at least a 25% improvement in pain and sx's overall.   Baseline:  Goal status: NOT MET    LONG TERM GOALS: Target date: 10/15/2022   Pt will demo improved hip strength to 5/5 bilat and improved core strength as evidenced by performance of core exercises without adverse effects for improved performance of daily activities and fxnl mobility with less pain.  Baseline:  Goal status: PROGRESSING  2.  Pt will be able to perform bed mobility without increased pain and difficulty.  Baseline:  Goal status: ONGOING  3.  Pt will be able to perform work activities without significant pain and limitation.    Baseline:  Goal status: PROGRESSING  4.  Pt will be independent with aquatic program and land based HEP for improved pain, strength, ROM, and function.  Baseline:  Goal status: ONGOING    PLAN:  PT FREQUENCY: 2x/week  PT DURATION: 5-6 weeks  PLANNED INTERVENTIONS: Therapeutic exercises, Therapeutic activity, Neuromuscular re-education, Gait training, Patient/Family education, Self Care, Joint mobilization, Stair training, Aquatic Therapy, Dry Needling, Electrical stimulation, Spinal mobilization, Cryotherapy, Moist heat, Taping, Ultrasound, Manual therapy, and Re-evaluation.  PLAN FOR NEXT SESSION:  Cont with STW to glute and core strengthening.  Cont with aquatic and land based  PT.  Update Land HEP.   Selinda Michaels III PT, DPT 10/07/22 5:49 PM

## 2022-10-07 ENCOUNTER — Encounter (HOSPITAL_BASED_OUTPATIENT_CLINIC_OR_DEPARTMENT_OTHER): Payer: Self-pay | Admitting: Physical Therapy

## 2022-10-07 ENCOUNTER — Ambulatory Visit (INDEPENDENT_AMBULATORY_CARE_PROVIDER_SITE_OTHER): Payer: BC Managed Care – PPO | Admitting: Psychiatry

## 2022-10-07 DIAGNOSIS — F411 Generalized anxiety disorder: Secondary | ICD-10-CM

## 2022-10-07 NOTE — Addendum Note (Signed)
Addended byShanon Ace on: 10/07/2022 05:05 PM   Modules accepted: Level of Service

## 2022-10-07 NOTE — Progress Notes (Signed)
[facility] Counselor/Therapist Progress Note  Patient ID: Dustin Rice, MRN: [id number],    Date: 10/07/2022  Time Spent: 52 minutes  Treatment Type: Individual Therapy  Reported Symptoms: anxiety, stressed  Mental Status Exam:  Appearance:   Casual and Neat     Behavior:  Appropriate, Sharing, and Motivated  Motor:  Normal  Speech/Language:   Clear and Coherent  Affect:  anxious  Mood:  anxious  Thought process:  Normal  Thought content:    WNL  Sensory/Perceptual disturbances:    WNL  Orientation:  oriented to person, place, time/date, situation, day of week, month of year, year, and stated date of Feb. 29, 2024  Attention:  Good  Concentration:  Good  Memory:  WNL  Fund of knowledge:   Good  Insight:    Good  Judgment:   Good  Impulse Control:  Good   Risk Assessment: Danger to Self:  No Self-injurious Behavior: No Danger to Others: No Duty to Warn:no Physical Aggression / Violence:No  Access to Firearms a concern: No  Gang Involvement:No   Subjective:   Patient in today for session and reporting continued anxiety and it is mostly concerning personal and family issues.  He does notice an improvement in how he is managing the anxiety and that is encouraging for him.  Realizing more and more how he cannot control others but can control himself. Today needing especially to work on his communication and listening skills at home with wife and in interactions with friends and others. Worked with specific examples with which patient is struggling especially communication and listening with wife and others, and practiced this some in session. Remains concerned about wife's diabetes and is intentionally showing more support to her.  Is very sensitive to the fact that when her sugar is elevated it definitely affects her communication with patient.  Caring for his elderly mother has improved some as his sister and brothers are all working together with him more effectively  which continues to be a work in progress but patient is optimistic.  Back pain is being better controlled with injection currently.   Interventions: Cognitive Behavioral Therapy and Ego-Supportive  Long term goal: Reduce overall level, frequency, and intensity of anxiety and depression so that daily functioning is not impaired. Short term goal: Increase understanding of beliefs and messages that produce worry and anxiety. Strategies: Identify and use specific coping strategies for anxiety and depression reduction.  Also explore alternative ways of communicating better with spouse and being more sensitive to needs within the family, including both wife and son.  Diagnosis:   ICD-10-CM   1. Generalized anxiety disorder  F41.1      Plan:  Patient today is motivated and actively participating in session as he focused more on his anxiety, and especially today on his communication and listening challenges.  Worked really well in session on these, working as noted above with specific examples and realizing how he can improve his marital communication with better listening and more sensitive verbal and nonverbal communication.Encouraged patient in his practice of positive/self affirming behaviors as discussed in session including: Remaining in the present and focusing on the things he can control versus cannot, staying in touch with supportive people, positive self talk, taking "breaks", healthy nutrition and exercise, allowing his faith to be an emotional support as well as spiritual, continue to make his family a high priority including himself, and realize the strength he shows working with goal-directed behaviors to move in  a direction that supports his improved emotional health and overall wellbeing.  Goal review and progress/challenges noted with patient.  Next appointment within 3 weeks.  This record has been created using [employer].  Chart creation errors have been sought, but may not  always have been located and corrected.  Such creation errors do not reflect on the standard of medical care provided.   Shanon Ace, LCSW

## 2022-10-08 ENCOUNTER — Encounter (HOSPITAL_BASED_OUTPATIENT_CLINIC_OR_DEPARTMENT_OTHER): Payer: Self-pay | Admitting: Physical Therapy

## 2022-10-08 ENCOUNTER — Ambulatory Visit (HOSPITAL_BASED_OUTPATIENT_CLINIC_OR_DEPARTMENT_OTHER): Payer: BC Managed Care – PPO | Attending: Neurological Surgery | Admitting: Physical Therapy

## 2022-10-08 DIAGNOSIS — M5459 Other low back pain: Secondary | ICD-10-CM | POA: Diagnosis not present

## 2022-10-08 DIAGNOSIS — M6281 Muscle weakness (generalized): Secondary | ICD-10-CM | POA: Diagnosis not present

## 2022-10-08 NOTE — Therapy (Signed)
OUTPATIENT PHYSICAL THERAPY TREATMENT NOTE   Patient Name: Dustin Rice MRN: [id number] DOB:Apr 24, 1956, 67 y.o., male Today's Date: 10/08/2022  END OF SESSION:  PT End of Session - 10/08/22 1034     Visit Number 12    Number of Visits 17    Date for PT Re-Evaluation 10/15/22    Authorization Type AETNA    PT Start Time 1032    PT Stop Time 1115    PT Time Calculation (min) 43 min    Activity Tolerance Patient tolerated treatment well    Behavior During Therapy [facility] for tasks assessed/performed                  Past Medical History:  Diagnosis Date   Allergic rhinitis    Cardiovascular risk factor 08/2013   10 YEARS   Cervical spine disease    Dyspnea    Esophageal reflux    GERD (gastroesophageal reflux disease)    Gilbert's syndrome    Hypercholesterolemia    Hyperlipidemia    Inguinal hernia    RIGHT   Palpitations    Prostatitis    RBBB    Shingles 03/2014   Past Surgical History:  Procedure Laterality Date   HERNIA REPAIR  2004   Patient Active Problem List   Diagnosis Date Noted   Palpitations 05/12/2016     REFERRING PROVIDER: Kristeen Miss, MD  REFERRING DIAG: [phone] (ICD-10-CM) - Spinal stenosis, lumbar region without neurogenic claudication  Rationale for Evaluation and Treatment: Rehabilitation  THERAPY DIAG:  Other low back pain  Muscle weakness (generalized)  ONSET DATE: Onset Nov 2021 ; PT order 06/25/2022  SUBJECTIVE:                                                                                                                                                                                           SUBJECTIVE STATEMENT: Pt reports compliance with land based and aquatic HEP.  Pain low    PERTINENT HISTORY:  Grade 1 anterolisthesis L3-L4 and L4-5  PAIN:  NPRS:  0-1/10 current, 8-9/10 worst, 0-1/10 best Pt reports 2/10 pain in AM.  Location:  R sided lower lumbar flank, tingling in toes.  Pt states if he leans  backward just a little bit, his tingling increases.  PRECAUTIONS: Other: MRI findings  WEIGHT BEARING RESTRICTIONS: No  FALLS:  Has patient fallen in last 6 months? 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   PLOF: Independent.  Pt has had pain for 2 years which has affected his daily activities and tolerance to activityy.   PATIENT GOALS: To  be able to go longer than 3 months between injections.  To improve core strength.  To improve pain.    OBJECTIVE:   DIAGNOSTIC FINDINGS:  X rays in 2021:  Lumbar:  Well-preserved lumbar lordosis and intervertebral disc spaces.  Moderate  to severe lumbar spondylosis   Lumbar MRI in 2022: INDINGS:  #  Grade 1 anterolisthesis L3-4 and L4-5.  #  Vertebral body heights are well maintained.  #  Small meningioma within L2 and L4.  #  Conus terminates at T12-L1 without evidence of tethering.  #  Nerve roots appear normal.  #  Incidental findings: None.     #  L1-2: Normal.  #    #  L2-3: Normal.  #    #  L3-4: Mild degenerative disc disease. Grade 1 anterolisthesis. There is facet arthropathy and disc bulge causing moderate central canal stenosis with mild bilateral neural foraminal narrowing.  #    #  L4-5: Mild degenerative disc disease. Grade 1 anterolisthesis. Facet arthropathy and disc bulge causing moderate to severe central canal stenosis and mild bilateral neural foraminal narrowing.  #    #  L5-S1: Facet arthropathy and disc bulge without significant central canal stenosis or neuroforaminal narrowing.     IMPRESSION:    There is facet arthropathy and disc bulge as well as grade 1 anterolisthesis causing moderate to severe central canal stenosis at the level of L4-5 and moderate canal stenosis at L3-4.    TODAY'S TREATMENT:                                                                                                                              Pt seen for aquatic therapy today.  Treatment  took place in water 3.25-4.5 ft in depth at the [place]. Temp of water was 91.  Pt entered/exited the pool via stairs with hand rail.  Hip hinge KB row KB resisted rotation Yellow noodle pull down feet staggered then wide stance Kettlebell/hand buoy carry forward and back Side lunge with yellow hand buoy add/abd  Pt requires the buoyancy and hydrostatic pressure of water for support, and to offload joints by unweighting joint load by at least 50 % in navel deep water and by at least 75-80% in chest to neck deep water.  Viscosity of the water is needed for resistance of strengthening. Water current perturbations provides challenge to standing balance requiring increased core activation.   Last land session: Therapeutic Exercise: -Reviewed pt presentation, current function, pain levels, and response to prior Rx.  -Pt performed:  Supine alt LE ext 2x10 with TrA while holding ball at 90 deg flexion Supine alt UE/LE with TrA 2x10 Supine SLR with TrA x 10 and with contralat UE ext x 10 reps PPT x10 with TA Supine clams with PPT 2x10 with GTB Supine bridge with TrA x 10 Supine lumbar rotation  x 10 reps Standing rows with RTB with TrA 2x10 Standing shoulder  extension with RTB 2x10 Supine piriformis stretch 2x30 sec Supine manual HS stretch 2x30 sec bilat    See below for pt education.    PATIENT EDUCATION:  Education details:  PT answered questions.  Exercise form and rationale, HEP, and POC.  Person educated: Patient Education method: Customer service manager Education comprehension: verbalized understanding and needs further education  HOME EXERCISE PROGRAM:  Pt has a HEP  Added aquatics: Access Code: [email or web address] URL: [email or web address] Date: 07/30/2022 Prepared by: Denton Meek  Exercises - [place]  - 1 x daily - 7 x weekly - 3 sets - 10 reps - Bilateral Shoulder Horizontal Abduction Adduction AROM  - 1 x daily  - 7 x weekly - 3 sets - 10 reps - Standing Hip Hinge  - 1 x daily - 7 x weekly - 3 sets - 10 reps - Standing Bilateral Low Shoulder Row with Anchored Resistance  - 1 x daily - 7 x weekly - 3 sets - 10 reps - Standing Low Shoulder Row with Anchored Resistance  - 1 x daily - 7 x weekly - 3 sets - 10 reps - Seated Straddle on Flotation Forward Breast Stroke Arms and Bicycle Legs  - 1 x daily - 7 x weekly - 3 sets - 10 reps  LAND HEP: - Supine Transversus Abdominis Bracing - Hands on Stomach  - 2 x daily - 7 x weekly - 2 sets - 10 reps - Supine March  - 1-2 x daily - 7 x weekly - 2 sets - 10 reps - Supine Core Control with Heel Slide  - 1 x daily - 7 x weekly - 2 sets - 10 reps  ASSESSMENT:  CLINICAL IMPRESSION: Pt with low pain.  Tolerates progression of aquatic intervention well.  Requires minor vc and demonstration for exercises on HEP.  Added aerobic capacity element. He has 1 more land based then aquatic session before DC.  Next aquatic session will introduce using flippers for added strengthening and aerobic capacity challenge then will be ready for DC.    OBJECTIVE IMPAIRMENTS: decreased activity tolerance, decreased ROM, decreased strength, hypomobility, and pain.   ACTIVITY LIMITATIONS: lifting, sitting, and bed mobility  PARTICIPATION LIMITATIONS: occupation  PERSONAL FACTORS: Time since onset of injury/illness/exacerbation are also affecting patient's functional outcome.   REHAB POTENTIAL: Good  CLINICAL DECISION MAKING: Stable/uncomplicated  EVALUATION COMPLEXITY: Low   GOALS:   SHORT TERM GOALS: Target date: 08/11/2022   Pt will tolerate aquatic therapy without adverse effects for improved tolerance to activity, core strength, and pain.  Baseline: Goal status: MET  2.  Pt will demo L rotation AROM to be [facility] for improved stiffness and mobility.  Baseline:  Goal status: MEt  3.  Pt will report at least a 25% improvement in pain and sx's overall.   Baseline:  Goal  status: Met 10/08/22    LONG TERM GOALS: Target date: 10/15/2022   Pt will demo improved hip strength to 5/5 bilat and improved core strength as evidenced by performance of core exercises without adverse effects for improved performance of daily activities and fxnl mobility with less pain.  Baseline:  Goal status: PROGRESSING  2.  Pt will be able to perform bed mobility without increased pain and difficulty.  Baseline:  Goal status: Met 10/08/22  3.  Pt will be able to perform work activities without significant pain and limitation.   Baseline:  Goal status: Met 10/08/22  4.  Pt will be independent with aquatic program and  land based HEP for improved pain, strength, ROM, and function.  Baseline:  Goal status: ONGOING    PLAN:  PT FREQUENCY: 2x/week  PT DURATION: 5-6 weeks  PLANNED INTERVENTIONS: Therapeutic exercises, Therapeutic activity, Neuromuscular re-education, Gait training, Patient/Family education, Self Care, Joint mobilization, Stair training, Aquatic Therapy, Dry Needling, Electrical stimulation, Spinal mobilization, Cryotherapy, Moist heat, Taping, Ultrasound, Manual therapy, and Re-evaluation.  PLAN FOR NEXT SESSION:  Cont with STW to glute and core strengthening.  Cont with aquatic and land based PT.  Update Land HEP.   Stanton Kidney (Frankie) Jakye Mullens MPT 10/08/22 1:15 PM

## 2022-10-13 ENCOUNTER — Encounter (HOSPITAL_BASED_OUTPATIENT_CLINIC_OR_DEPARTMENT_OTHER): Payer: Self-pay | Admitting: Physical Therapy

## 2022-10-13 ENCOUNTER — Ambulatory Visit (HOSPITAL_BASED_OUTPATIENT_CLINIC_OR_DEPARTMENT_OTHER): Payer: BC Managed Care – PPO | Admitting: Physical Therapy

## 2022-10-13 DIAGNOSIS — M6281 Muscle weakness (generalized): Secondary | ICD-10-CM | POA: Diagnosis not present

## 2022-10-13 DIAGNOSIS — M5459 Other low back pain: Secondary | ICD-10-CM

## 2022-10-13 NOTE — Therapy (Signed)
OUTPATIENT PHYSICAL THERAPY TREATMENT NOTE   Patient Name: Dustin Rice MRN: [id number] DOB:1956-03-05, 67 y.o., male Today's Date: 10/14/2022  END OF SESSION:  PT End of Session - 10/13/22 1542     Visit Number 13    Number of Visits 17    Date for PT Re-Evaluation 10/15/22    Authorization Type AETNA    PT Start Time 1537    PT Stop Time 1622    PT Time Calculation (min) 45 min    Activity Tolerance Patient tolerated treatment well    Behavior During Therapy [facility] for tasks assessed/performed                  Past Medical History:  Diagnosis Date   Allergic rhinitis    Cardiovascular risk factor 08/2013   10 YEARS   Cervical spine disease    Dyspnea    Esophageal reflux    GERD (gastroesophageal reflux disease)    Gilbert's syndrome    Hypercholesterolemia    Hyperlipidemia    Inguinal hernia    RIGHT   Palpitations    Prostatitis    RBBB    Shingles 03/2014   Past Surgical History:  Procedure Laterality Date   HERNIA REPAIR  2004   Patient Active Problem List   Diagnosis Date Noted   Palpitations 05/12/2016     REFERRING PROVIDER: Kristeen Miss, MD  REFERRING DIAG: [phone] (ICD-10-CM) - Spinal stenosis, lumbar region without neurogenic claudication  Rationale for Evaluation and Treatment: Rehabilitation  THERAPY DIAG:  Other low back pain  Muscle weakness (generalized)  ONSET DATE: Onset Nov 2021 ; PT order 06/25/2022  SUBJECTIVE:                                                                                                                                                                                           SUBJECTIVE STATEMENT: Pt reports he was hurting earlier today though feeling better now.  Pt states he felt good after prior land based Rx and had no adverse effects after prior aquatic Rx.  Pt states his mornings are the worse, but this morning was much better.  Pt reports compliance with HEP.    PERTINENT HISTORY:  Grade 1  anterolisthesis L3-L4 and L4-5  PAIN:  NPRS:  1-2/10 currently but was a 3/10 earlier today; 8-9/10 worst; 0-1/10 best Pt reports 2/10 pain in AM.  Location:  R sided lower lumbar flank, tingling in toes.  Pt states if he leans backward just a little bit, his tingling increases.  PRECAUTIONS: Other: MRI findings  WEIGHT BEARING RESTRICTIONS: No  FALLS:  Has patient fallen in  last 6 months? 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   PLOF: Independent.  Pt has had pain for 2 years which has affected his daily activities and tolerance to activityy.   PATIENT GOALS: To be able to go longer than 3 months between injections.  To improve core strength.  To improve pain.    OBJECTIVE:   DIAGNOSTIC FINDINGS:  X rays in 2021:  Lumbar:  Well-preserved lumbar lordosis and intervertebral disc spaces.  Moderate  to severe lumbar spondylosis   Lumbar MRI in 2022: INDINGS:  #  Grade 1 anterolisthesis L3-4 and L4-5.  #  Vertebral body heights are well maintained.  #  Small meningioma within L2 and L4.  #  Conus terminates at T12-L1 without evidence of tethering.  #  Nerve roots appear normal.  #  Incidental findings: None.     #  L1-2: Normal.  #    #  L2-3: Normal.  #    #  L3-4: Mild degenerative disc disease. Grade 1 anterolisthesis. There is facet arthropathy and disc bulge causing moderate central canal stenosis with mild bilateral neural foraminal narrowing.  #    #  L4-5: Mild degenerative disc disease. Grade 1 anterolisthesis. Facet arthropathy and disc bulge causing moderate to severe central canal stenosis and mild bilateral neural foraminal narrowing.  #    #  L5-S1: Facet arthropathy and disc bulge without significant central canal stenosis or neuroforaminal narrowing.     IMPRESSION:    There is facet arthropathy and disc bulge as well as grade 1 anterolisthesis causing moderate to severe central canal stenosis at the level of L4-5  and moderate canal stenosis at L3-4.    TODAY'S TREATMENT:                                                                                                                                Therapeutic Exercise: -Reviewed pt presentation, current function, pain levels, and response to prior Rx.  -PT reviewed and Updated HEP.  Pt received a HEP handout and was educated in correct form and appropriate frequency.  Pt instructed he should not have pain with HEP.    -Pt performed:  Supine alt LE ext 2x10 with TrA while holding ball at 90 deg flexion Supine alt UE/LE with TrA 2x10 Supine SLR with TrA x 10  PPT x10 with TA Supine clams with PPT 2x10 with GTB Supine bridge with TrA x 8 Supine lumbar rotation  x 10 reps Standing rows with RTB with TrA 2x10 Standing shoulder extension with RTB 2x10 Supine piriformis stretch 3x30 sec  See below for pt education.    PATIENT EDUCATION:  Education details:  PT answered questions.  Exercise form and rationale, HEP, and POC.  Person educated: Patient Education method: Customer service manager, verbal cues, handout Education comprehension: verbalized understanding and needs further education  HOME EXERCISE PROGRAM:  Pt  has a HEP  Added aquatics: Access Code: [email or web address] URL: [email or web address] Date: 07/30/2022 Prepared by: Denton Meek  Exercises - [place]  - 1 x daily - 7 x weekly - 3 sets - 10 reps - Bilateral Shoulder Horizontal Abduction Adduction AROM  - 1 x daily - 7 x weekly - 3 sets - 10 reps - Standing Hip Hinge  - 1 x daily - 7 x weekly - 3 sets - 10 reps - Standing Bilateral Low Shoulder Row with Anchored Resistance  - 1 x daily - 7 x weekly - 3 sets - 10 reps - Standing Low Shoulder Row with Anchored Resistance  - 1 x daily - 7 x weekly - 3 sets - 10 reps - Seated Straddle on Flotation Forward Breast Stroke Arms and Bicycle Legs  - 1 x daily - 7 x weekly - 3 sets - 10 reps  LAND HEP: -  Supine March  - 1 x daily - 7 x weekly - 2 sets - 10 reps - Supine Core Control with Leg Extension  - 1 x daily - 7 x weekly - 2 sets - 10 reps - Small Range Straight Leg Raise  - 1 x daily - 5-6 x weekly - 2 sets - 10 reps - Hooklying Clamshell with Resistance  - 1 x daily - 4 x weekly - 2 sets - 10 reps - Standing Shoulder Row with Anchored Resistance  - 1 x daily - 4-5 x weekly - 2 sets - 10 reps - Shoulder extension with resistance - Neutral  - 1 x daily - 4-5 x weekly - 2 sets - 10 reps  ASSESSMENT:  CLINICAL IMPRESSION: PT progressed core exercises and pt tolerated progression well.  PT worked on progressing HEP and educated pt in [place].  Pt received an advanced HEP handout and demonstrates good understanding.  Pt performed exercises well with cuing for correct form.  Pt could feel his back with bridging.  PT had pt stop bridging and did not include in HEP.  He responded well to Rx having no increased pain after Rx.  Pt should benefit from continued skilled PT services to address ongoing goals, improve pain, and to assist in restoring desired level of function.      OBJECTIVE IMPAIRMENTS: decreased activity tolerance, decreased ROM, decreased strength, hypomobility, and pain.   ACTIVITY LIMITATIONS: lifting, sitting, and bed mobility  PARTICIPATION LIMITATIONS: occupation  PERSONAL FACTORS: Time since onset of injury/illness/exacerbation are also affecting patient's functional outcome.   REHAB POTENTIAL: Good  CLINICAL DECISION MAKING: Stable/uncomplicated  EVALUATION COMPLEXITY: Low   GOALS:   SHORT TERM GOALS: Target date: 08/11/2022   Pt will tolerate aquatic therapy without adverse effects for improved tolerance to activity, core strength, and pain.  Baseline: Goal status: ONGOING  2.  Pt will demo L rotation AROM to be [facility] for improved stiffness and mobility.  Baseline:  Goal status: ONGOING  3.  Pt will report at least a 25% improvement in pain and sx's overall.    Baseline:  Goal status: NOT MET    LONG TERM GOALS: Target date: 10/15/2022   Pt will demo improved hip strength to 5/5 bilat and improved core strength as evidenced by performance of core exercises without adverse effects for improved performance of daily activities and fxnl mobility with less pain.  Baseline:  Goal status: PROGRESSING  2.  Pt will be able to perform bed mobility without increased pain and difficulty.  Baseline:  Goal status: ONGOING  3.  Pt will be able to perform work activities without significant pain and limitation.   Baseline:  Goal status: PROGRESSING  4.  Pt will be independent with aquatic program and land based HEP for improved pain, strength, ROM, and function.  Baseline:  Goal status: ONGOING    PLAN:  PT FREQUENCY: 2x/week  PT DURATION: 5-6 weeks  PLANNED INTERVENTIONS: Therapeutic exercises, Therapeutic activity, Neuromuscular re-education, Gait training, Patient/Family education, Self Care, Joint mobilization, Stair training, Aquatic Therapy, Dry Needling, Electrical stimulation, Spinal mobilization, Cryotherapy, Moist heat, Taping, Ultrasound, Manual therapy, and Re-evaluation.  PLAN FOR NEXT SESSION:  Cont with STW to glute and core strengthening.  Cont with aquatic and land based PT.    Selinda Michaels III PT, DPT 10/14/22 4:23 PM

## 2022-10-15 ENCOUNTER — Ambulatory Visit (HOSPITAL_BASED_OUTPATIENT_CLINIC_OR_DEPARTMENT_OTHER): Payer: BC Managed Care – PPO | Admitting: Physical Therapy

## 2022-10-15 ENCOUNTER — Encounter (HOSPITAL_BASED_OUTPATIENT_CLINIC_OR_DEPARTMENT_OTHER): Payer: Self-pay | Admitting: Physical Therapy

## 2022-10-15 DIAGNOSIS — M6281 Muscle weakness (generalized): Secondary | ICD-10-CM | POA: Diagnosis not present

## 2022-10-15 DIAGNOSIS — M5459 Other low back pain: Secondary | ICD-10-CM | POA: Diagnosis not present

## 2022-10-15 NOTE — Therapy (Signed)
OUTPATIENT PHYSICAL THERAPY TREATMENT NOTE  Progress Note Reporting Period 07/22/23 to 10/15/22  See note below for Objective Data and Assessment of Progress/Goals.     Patient Name: Dustin Rice MRN: [id number] DOB:[birth year], 67 y.o., male Today's Date: 10/15/2022  END OF SESSION:  PT End of Session - 10/15/22 1307     Visit Number 14    Number of Visits 17    Date for PT Re-Evaluation 10/15/22    Authorization Type AETNA    PT Start Time 1031    PT Stop Time 1115    PT Time Calculation (min) 44 min    Activity Tolerance Patient tolerated treatment well    Behavior During Therapy [facility] for tasks assessed/performed                   Past Medical History:  Diagnosis Date   Allergic rhinitis    Cardiovascular risk factor 08/2013   10 YEARS   Cervical spine disease    Dyspnea    Esophageal reflux    GERD (gastroesophageal reflux disease)    Gilbert's syndrome    Hypercholesterolemia    Hyperlipidemia    Inguinal hernia    RIGHT   Palpitations    Prostatitis    RBBB    Shingles 03/2014   Past Surgical History:  Procedure Laterality Date   HERNIA REPAIR  2004   Patient Active Problem List   Diagnosis Date Noted   Palpitations 05/12/2016     REFERRING PROVIDER: Kristeen Miss, MD  REFERRING DIAG: [phone] (ICD-10-CM) - Spinal stenosis, lumbar region without neurogenic claudication  Rationale for Evaluation and Treatment: Rehabilitation  THERAPY DIAG:  Other low back pain  Muscle weakness (generalized)  ONSET DATE: Onset Nov 2021 ; PT order 06/25/2022  SUBJECTIVE:                                                                                                                                                                                           SUBJECTIVE STATEMENT: Pt reports minimal pain this am. Good response to last land based visit.    PERTINENT HISTORY:  Grade 1 anterolisthesis L3-L4 and L4-5  PAIN:  NPRS:  1-2/10 currently but  was a 3/10 earlier today; 8-9/10 worst; 0-1/10 best Pt reports 2/10 pain in AM.  Location:  R sided lower lumbar flank, tingling in toes.  Pt states if he leans backward just a little bit, his tingling increases.  PRECAUTIONS: Other: MRI findings  WEIGHT BEARING RESTRICTIONS: No  FALLS:  Has patient fallen in last 6 months? 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   PLOF: Independent.  Pt has had pain for 2 years which has affected his daily activities and tolerance to activityy.   PATIENT GOALS: To be able to go longer than 3 months between injections.  To improve core strength.  To improve pain.    OBJECTIVE:   DIAGNOSTIC FINDINGS:  X rays in 2021:  Lumbar:  Well-preserved lumbar lordosis and intervertebral disc spaces.  Moderate  to severe lumbar spondylosis   Lumbar MRI in 2022: INDINGS:  #  Grade 1 anterolisthesis L3-4 and L4-5.  #  Vertebral body heights are well maintained.  #  Small meningioma within L2 and L4.  #  Conus terminates at T12-L1 without evidence of tethering.  #  Nerve roots appear normal.  #  Incidental findings: None.     #  L1-2: Normal.  #    #  L2-3: Normal.  #    #  L3-4: Mild degenerative disc disease. Grade 1 anterolisthesis. There is facet arthropathy and disc bulge causing moderate central canal stenosis with mild bilateral neural foraminal narrowing.  #    #  L4-5: Mild degenerative disc disease. Grade 1 anterolisthesis. Facet arthropathy and disc bulge causing moderate to severe central canal stenosis and mild bilateral neural foraminal narrowing.  #    #  L5-S1: Facet arthropathy and disc bulge without significant central canal stenosis or neuroforaminal narrowing.     IMPRESSION:    There is facet arthropathy and disc bulge as well as grade 1 anterolisthesis causing moderate to severe central canal stenosis at the level of L4-5 and moderate canal stenosis at L3-4.    LUMBAR ROM:    AROM eval  10/15/22  Flexion WFL   Extension NT 10d slight pain  Right lateral flexion WFL   Left lateral flexion WFL   Right rotation WFL   Left rotation 75% with pain Full no pain   (Blank rows = not tested)   LOWER EXTREMITY ROM:      Active  Right eval Left eval  Hip flexion      Hip extension      Hip abduction [facility]  Hip adduction      Hip internal rotation      Hip external rotation      Knee flexion      Knee extension [facility]  Ankle dorsiflexion [facility]  Ankle plantarflexion [facility] WFL  Ankle inversion      Ankle eversion       (Blank rows = not tested)   LOWER EXTREMITY MMT:     MMT Right eval Left eval  Hip flexion 4+/5 4+/5  Hip extension      Hip abduction 4+/5 4/5  Hip adduction      Hip internal rotation      Hip external rotation 4/5 4+/5  Knee flexion 5/5 seated 5/5 seated  Knee extension 5/5 5/5  Ankle dorsiflexion 5/5 5/5  Ankle plantarflexion WFL seated  WFL seated  Ankle inversion      Ankle eversion        TODAY'S TREATMENT:  Pt seen for aquatic therapy today.  Treatment took place in water 3.25-4.5 ft in depth at the [place]. Temp of water was 91.  Pt entered/exited the pool via 92d with hand rail.       *Core engagement for strengthening and proprioceptive training: hand bells ue oscillations in  sagittal and frontslplanes feet staggered then   wide stance  3 sets of fast/slow 15s in each position.   *Pt directed through final aquatic HEP as per issued:  *KB row  *cycling on noodle  *Noodle pull down  *hip hinge   Pt requires the buoyancy and hydrostatic pressure of water for support, and to offload joints by unweighting joint load by at least 50 % in navel deep water and by at least 75-80% in chest to neck deep water.  Viscosity of the water is needed for resistance of strengthening. Water current  perturbations provides challenge to standing balance requiring increased core activation.  Objective testing   Previous Therapeutic Exercise: -Reviewed pt presentation, current function, pain levels, and response to prior Rx.  -PT reviewed and Updated HEP.  Pt received a HEP handout and was educated in correct form and appropriate frequency.  Pt instructed he should not have pain with HEP.    -Pt performed:  Supine alt LE ext 2x10 with TrA while holding ball at 90 deg flexion Supine alt UE/LE with TrA 2x10 Supine SLR with TrA x 10  PPT x10 with TA Supine clams with PPT 2x10 with GTB Supine bridge with TrA x 8 Supine lumbar rotation  x 10 reps Standing rows with RTB with TrA 2x10 Standing shoulder extension with RTB 2x10 Supine piriformis stretch 3x30 sec  See below for pt education.    PATIENT EDUCATION:  Education details:  PT answered questions.  Exercise form and rationale, HEP, and POC.  Person educated: Patient Education method: Customer service manager, verbal cues, handout Education comprehension: verbalized understanding and needs further education  HOME EXERCISE PROGRAM:  Pt has a HEP  Added aquatics: Access Code: [email or web address] URL: [email or web address] Date: 07/30/2022 Prepared by: Denton Meek  Exercises Modified land based exercises for aquatic use - Kettlebell Suitcase Car[place]  - 1 x daily - 7 x weekly - 3 sets - 10 reps - Bilateral Shoulder Horizontal Abduction Adduction AROM  - 1 x daily - 7 x weekly - 3 sets - 10 reps - Standing Hip Hinge  - 1 x daily - 7 x weekly - 3 sets - 10 reps - Standing Bilateral Low Shoulder Row with Anchored Resistance  - 1 x daily - 7 x weekly - 3 sets - 10 reps - Standing Low Shoulder Row with Anchored Resistance  - 1 x daily - 7 x weekly - 3 sets - 10 reps - Seated Straddle on Flotation Forward Breast Stroke Arms and Bicycle Legs  - 1 x daily - 7 x weekly - 3 sets - 10 reps  LAND HEP: - Supine March  - 1 x  daily - 7 x weekly - 2 sets - 10 reps - Supine Core Control with Leg Extension  - 1 x daily - 7 x weekly - 2 sets - 10 reps - Small Range Straight Leg Raise  - 1 x daily - 5-6 x weekly - 2 sets - 10 reps - Hooklying Clamshell with Resistance  - 1 x daily - 4 x weekly - 2 sets - 10 reps - Standing Shoulder Row with Anchored Resistance  - 1 x daily -  4-5 x weekly - 2 sets - 10 reps - Shoulder extension with resistance - Neutral  - 1 x daily - 4-5 x weekly - 2 sets - 10 reps  ASSESSMENT:  CLINICAL IMPRESSION: PN: Pt without difficulty getting in and out of bed, he does report mild pain but not limiting. He completes left lumbar rotation through full range without discomfort and reports little limitation due to LBP with work activities. He is indep with final aquatic HEP reaching his max potential in this setting although he has not yet reached his maximal potential land based.  He will transition fully and benefit from land based skilled physical therapy to improve all functional mobility and further manage chronic pain. Re-certification with added goals to be complete next visit.      OBJECTIVE IMPAIRMENTS: decreased activity tolerance, decreased ROM, decreased strength, hypomobility, and pain.   ACTIVITY LIMITATIONS: lifting, sitting, and bed mobility  PARTICIPATION LIMITATIONS: occupation  PERSONAL FACTORS: Time since onset of injury/illness/exacerbation are also affecting patient's functional outcome.   REHAB POTENTIAL: Good  CLINICAL DECISION MAKING: Stable/uncomplicated  EVALUATION COMPLEXITY: Low   GOALS:   SHORT TERM GOALS: Target date: 08/11/2022   Pt will tolerate aquatic therapy without adverse effects for improved tolerance to activity, core strength, and pain.  Baseline: Goal status: Met 10/15/22  2.  Pt will demo L rotation AROM to be [facility] for improved stiffness and mobility.  Baseline:  Goal status: Met 10/14/22  3.  Pt will report at least a 25% improvement in pain  and sx's overall.   Baseline:  Goal status: MET 10/15/22     LONG TERM GOALS: Target date: 10/15/2022   Pt will demo improved hip strength to 5/5 bilat and improved core strength as evidenced by performance of core exercises without adverse effects for improved performance of daily activities and fxnl mobility with less pain.  Baseline:  Goal status: PROGRESSING  2.  Pt will be able to perform bed mobility without increased pain and difficulty.  Baseline:  Goal status: Met 10/15/22  3.  Pt will be able to perform work activities without significant pain and limitation.   Baseline:  Goal status: Met 3/824  4.  Pt will be independent with aquatic program and land based HEP for improved pain, strength, ROM, and function.  Baseline:  Goal status:Partially Met (aquatics) 10/15/22    PLAN:  PT FREQUENCY: 2x/week  PT DURATION: 5-6 weeks  PLANNED INTERVENTIONS: Therapeutic exercises, Therapeutic activity, Neuromuscular re-education, Gait training, Patient/Family education, Self Care, Joint mobilization, Stair training, Aquatic Therapy, Dry Needling, Electrical stimulation, Spinal mobilization, Cryotherapy, Moist heat, Taping, Ultrasound, Manual therapy, and Re-evaluation.  PLAN FOR NEXT SESSION:  Re-cert.   Stanton Kidney (Frankie) Keian Odriscoll MPT 10/15/22 1:08 PM

## 2022-10-18 ENCOUNTER — Ambulatory Visit (INDEPENDENT_AMBULATORY_CARE_PROVIDER_SITE_OTHER): Payer: BC Managed Care – PPO | Admitting: Psychiatry

## 2022-10-18 DIAGNOSIS — F411 Generalized anxiety disorder: Secondary | ICD-10-CM | POA: Diagnosis not present

## 2022-10-18 NOTE — Progress Notes (Signed)
[facility] Counselor/Therapist Progress Note  Patient ID: Dustin Rice, MRN: [id number],    Date: 10/18/2022  Time Spent: 55 minutes   Treatment Type: Individual Therapy  Reported Symptoms: anxiety  Mental Status Exam:  Appearance:   Casual     Behavior:  Appropriate, Sharing, and Motivated  Motor:  Normal  Speech/Language:   Clear and Coherent  Affect:  anxious  Mood:  anxious  Thought process:  goal directed  Thought content:    WNL  Sensory/Perceptual disturbances:    WNL  Orientation:  oriented to person, place, time/date, situation, day of week, month of year, year, and stated date of October 18, 2022  Attention:  Good  Concentration:  Good  Memory:  WNL  Fund of knowledge:   Good  Insight:    Good  Judgment:   Good  Impulse Control:  Good   Risk Assessment: Danger to Self:  No Self-injurious Behavior: No Danger to Others: No Duty to Warn:no Physical Aggression / Violence:No  Access to Firearms a concern: No  Gang Involvement:No   Subjective: Patient today in session reporting anxiety mostly regarding family, family communication, and mother's continuing dementia. Feels his involvement with mom and other extended family have influenced his marital relationship and communication with his wife.Needed to focus on and process some  issues between patient and his wife and patient's mother. Working on relationships with wife and with mother and how to best communicate especially with and for his mother, while also talking through some anxiety within himself, and his wife's health.  Focused heavily today on communication skills, including communicating and difficult situations or about difficult feelings.  Processing more of his thoughts and observations regarding his mother who has dementia.  Discussed several ways of managing certain behaviors that mom exhibits, and particularly patient is wanting to respond to mom in positive ways, or at times when he is feeling  excessive anxiety because of what may be happening.  Further processing also of his wife's diabetes that he is really concerned about and she is following through with her medical providers on this.  The relationship between the patient's sister and brothers does seem to be improving since they made adjustments in helping manage the supervision and care of their elderly mother.   Interventions: Cognitive Behavioral Therapy and Ego-Supportive  Long term goal: Reduce overall level, frequency, and intensity of anxiety and depression so that daily functioning is not impaired. Short term goal: Increase understanding of beliefs and messages that produce worry and anxiety. Strategies: Identify and use specific coping strategies for anxiety and depression reduction.  Also explore alternative ways of communicating better with spouse and being more sensitive to needs within the family, including both wife and son.  Diagnosis:   ICD-10-CM   1. Generalized anxiety disorder  F41.1      Plan:  Patient today working on his anxiety along with communication issues and supporting his mother with dementia.  Overall, his anxiety has decreased some even though episodically sometimes it will increase, but is not usually long-lasting.  Does well and following up on suggestions that come up in sessions as to best practices for managing anxiety.  Has been working with goal-directed behaviors and needs to continue this in order to keep progressing and managing his anxiety and also in his communication within the family and beyond.  Patient seems to be even more motivated today and continues to work hard on his goals.  Actively participates in session and  reports that he is making progress, and that progress shows through in his behavior. Encouraged patient in practicing more positive/self affirming behaviors as discussed in session including: Stay in the present and focused on the things he can control versus cannot, remain  in touch with supportive people, positive self talk, taking "breaks", healthy nutrition and exercise, allowing his faith to be an emotional support as well as spiritual, continue to make his family a high priority including himself, and recognize the strength he shows working with goal-directed behaviors to move in a direction that supports his improved emotional health and overall outlook.  Goal review and progress/challenges noted with patient.  Next appointment within 2 to 3 weeks.  This record has been created using [employer].  Chart creation errors have been sought, but may not always have been located and corrected.  Such creation errors do not reflect on the standard of medical care provided.   Shanon Ace, LCSW

## 2022-10-20 DIAGNOSIS — Z6829 Body mass index (BMI) 29.0-29.9, adult: Secondary | ICD-10-CM | POA: Diagnosis not present

## 2022-10-20 DIAGNOSIS — M48061 Spinal stenosis, lumbar region without neurogenic claudication: Secondary | ICD-10-CM | POA: Diagnosis not present

## 2022-11-03 ENCOUNTER — Ambulatory Visit: Payer: BC Managed Care – PPO | Admitting: Psychiatry

## 2022-11-03 DIAGNOSIS — F411 Generalized anxiety disorder: Secondary | ICD-10-CM

## 2022-11-03 NOTE — Progress Notes (Signed)
[facility] Counselor/Therapist Progress Note  Patient ID: Dustin Rice, MRN: [id number],    Date: 11/03/2022  Time Spent: 50 minutes   Treatment Type: Individual Therapy  Reported Symptoms: anxiety  Mental Status Exam:  Appearance:   Casual     Behavior:  Appropriate, Sharing, and Motivated  Motor:  Normal  Speech/Language:   Negative  Affect:  Appropriate  Mood:  anxious  Thought process:  goal directed  Thought content:    WNL  Sensory/Perceptual disturbances:    WNL  Orientation:  oriented to person, place, time/date, situation, day of week, month of year, year, and stated date of November 03, 2022  Attention:  Good  Concentration:  Good  Memory:  WNL  Fund of knowledge:   Good  Insight:    Good  Judgment:   Good  Impulse Control:  Good/ Fair   Risk Assessment: Danger to Self:  No Self-injurious Behavior: No Danger to Others: No Duty to Warn:no Physical Aggression / Violence:No  Access to Firearms a concern: No  Gang Involvement:No   Subjective:  Patient in session today and reporting anxiety which tends to be present mostly in family communication, family activities, and his mother's continuing dementia and being in charge of her care which involves family members and healthcare people outside of the family. Especially working on his behavior in marriage concerning his communication and tendency to interrupt wife. He and wife both trying to improve their communication and are showing progress. Concerned re: wife's diabetes and how it fluctuates at times. Processed more of his concerns today regarding some family issues between siblings. (Not all details included in this note due to patient privacy needs.)  Overall, patient is following through well on goal-directed behaviors and noticing progress within himself and and marital relationship.  Communication skills improving and sensitivity improving.  This morning to make better choices for relationships and the  family.  Interventions: Cognitive Behavioral Therapy and Insight-Oriented  Long term goal: Reduce overall level, frequency, and intensity of anxiety and depression so that daily functioning is not impaired. Short term goal: Increase understanding of beliefs and messages that produce worry and anxiety. Strategies: Identify and use specific coping strategies for anxiety and depression reduction.  Also explore alternative ways of communicating better with spouse and being more sensitive to needs within the family, including both wife and son.  Diagnosis:   ICD-10-CM   1. Generalized anxiety disorder  F41.1      Plan:  Patient in session today working on his communication issues, anxiety, and discussing more about supporting his mother with dementia, and also improving relationships within the family and setting appropriate limits.  Patient is making progress and needs to continue working with goal-directed behaviors to keep moving in a forward direction. Encouraged patient in practicing more positive and self affirming behaviors as noted in session including: Staying in touch with supportive people, positive self talk, taking "breaks" as needed, healthy nutrition and exercise, staying in the present and focused on the things he can control versus cannot control, allowing his faith to be an emotional support as well as spiritual, continue making his family a high priority including himself, and realize the strength he shows working with goal-directed behaviors to move in a direction that supports his improved emotional health and overall outlook.  Goal review and progress/challenges noted with patient.  Next appointment within 2 to 3 weeks.  This record has been created using [employer].  Chart creation errors have  been sought, but may not always have been located and corrected.  Such creation errors do not reflect on the standard of medical care provided.    Shanon Ace,  LCSW

## 2022-11-10 ENCOUNTER — Ambulatory Visit (HOSPITAL_BASED_OUTPATIENT_CLINIC_OR_DEPARTMENT_OTHER): Payer: BC Managed Care – PPO | Attending: Neurological Surgery | Admitting: Physical Therapy

## 2022-11-10 ENCOUNTER — Encounter (HOSPITAL_BASED_OUTPATIENT_CLINIC_OR_DEPARTMENT_OTHER): Payer: Self-pay | Admitting: Physical Therapy

## 2022-11-10 DIAGNOSIS — M6281 Muscle weakness (generalized): Secondary | ICD-10-CM | POA: Diagnosis not present

## 2022-11-10 DIAGNOSIS — M5459 Other low back pain: Secondary | ICD-10-CM | POA: Diagnosis not present

## 2022-11-10 NOTE — Therapy (Signed)
OUTPATIENT PHYSICAL THERAPY TREATMENT NOTE  Progress Note Reporting Period 07/22/23 to 10/15/22  See note below for Objective Data and Assessment of Progress/Goals.     Patient Name: Dustin Rice MRN: [id number] DOB:[birth year], 67 y.o., male Today's Date: 11/11/2022  END OF SESSION:  PT End of Session - 11/10/22 1457     Visit Number 15    Number of Visits 20    Date for PT Re-Evaluation 12/22/22    Authorization Type AET[employer]    PT Start Time 1450    PT Stop Time 1532    PT Time Calculation (min) 42 min    Activity Tolerance Patient tolerated treatment well;No increased pain    Behavior During Therapy [facility] for tasks assessed/performed                   Past Medical History:  Diagnosis Date   Allergic rhinitis    Cardiovascular risk factor 08/2013   10 YEARS   Cervical spine disease    Dyspnea    Esophageal reflux    GERD (gastroesophageal reflux disease)    Gilbert's syndrome    Hypercholesterolemia    Hyperlipidemia    Inguinal hernia    RIGHT   Palpitations    Prostatitis    RBBB    Shingles 03/2014   Past Surgical History:  Procedure Laterality Date   HERNIA REPAIR  2004   Patient Active Problem List   Diagnosis Date Noted   Palpitations 05/12/2016     REFERRING PROVIDER: Kristeen Miss, MD  REFERRING DIAG: [phone] (ICD-10-CM) - Spinal stenosis, lumbar region without neurogenic claudication  Rationale for Evaluation and Treatment: Rehabilitation  THERAPY DIAG:  Other low back pain  Muscle weakness (generalized)  ONSET DATE: Onset Nov 2021 ; PT order 06/25/2022  SUBJECTIVE:                                                                                                                                                                                           SUBJECTIVE STATEMENT: "Right now, I'm feeling pretty good".  Pt is feeling better being less than 30 days from his injection time than he typically does.  Pt wants to stretch out time  b/w injections and only receive 2 injections per year.  Pt worked out in the pool yesterday and felt sore afterwards.  Pt woke up with 4/10 pain which has now improved to 0/10 currently.  Pt wears a binder some which helps his pain and improves his sx's.  Pt states he is feeling better since performing the core exercises, stretches, and using the binder.  Pt does have limitations with his  walking program due to his back.      PERTINENT HISTORY:  Grade 1 anterolisthesis L3-L4 and L4-5  PAIN:  NPRS:  0/10 currently ; 5-6/10 worst; 0/10 best Pt reports 2/10 pain in AM.  Location:  R sided lower lumbar flank, tingling in toes.  Pt states if he leans backward just a little bit, his tingling increases.  PRECAUTIONS: Other: MRI findings  WEIGHT BEARING RESTRICTIONS: No  FALLS:  Has patient fallen in last 6 months? 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   PLOF: Independent.  Pt has had pain for 2 years which has affected his daily activities and tolerance to activityy.   PATIENT GOALS: To be able to go longer than 3 months between injections.  To improve core strength.  To improve pain.  To avoid surgery    OBJECTIVE:   DIAGNOSTIC FINDINGS:  X rays in 2021:  Lumbar:  Well-preserved lumbar lordosis and intervertebral disc spaces.  Moderate  to severe lumbar spondylosis   Lumbar MRI in 2022: INDINGS:  #  Grade 1 anterolisthesis L3-4 and L4-5.  #  Vertebral body heights are well maintained.  #  Small meningioma within L2 and L4.  #  Conus terminates at T12-L1 without evidence of tethering.  #  Nerve roots appear normal.  #  Incidental findings: None.     #  L1-2: Normal.  #    #  L2-3: Normal.  #    #  L3-4: Mild degenerative disc disease. Grade 1 anterolisthesis. There is facet arthropathy and disc bulge causing moderate central canal stenosis with mild bilateral neural foraminal narrowing.  #    #  L4-5: Mild degenerative disc disease.  Grade 1 anterolisthesis. Facet arthropathy and disc bulge causing moderate to severe central canal stenosis and mild bilateral neural foraminal narrowing.  #    #  L5-S1: Facet arthropathy and disc bulge without significant central canal stenosis or neuroforaminal narrowing.     IMPRESSION:    There is facet arthropathy and disc bulge as well as grade 1 anterolisthesis causing moderate to severe central canal stenosis at the level of L4-5 and moderate canal stenosis at L3-4.      Today's Treatment:   LE Strength: Hip flexion:  R:  5/5, L:  4+/5 Hip ER:  R:  5/5, L:  4+/5   PATIENT SURVEYS:  FOTO:  Prior / Current:  81 / 57.  Goal of 62.  PT spent extensive time going through his land based and aquatic HEP.  PT educated pt in correct exercises and correct form.  PT answered pt's questions.  PT re-printed HEP and went over handout with pt.     Previous Therapeutic Exercise: -Reviewed pt presentation, current function, pain levels, and response to prior Rx.     -Pt performed: Supine alt LE ext 2x10 with TrA while holding ball at 90 deg flexion Supine alt UE/LE with TrA 2x10   See below for pt education.   PATIENT EDUCATION:  Education details:  PT answered questions.  Exercise form and rationale, HEP, objective findings, progress, and POC.  Person educated: Patient Education method: Customer service manager, verbal cues, handout Education comprehension: verbalized understanding and needs further education  HOME EXERCISE PROGRAM:  Pt has a HEP  Added aquatics: Access Code: [email or web address] URL: [email or web address] Date: 07/30/2022 Prepared by: Denton Meek  Exercises Modified land based exercises for aquatic use - Kettlebell Suitcase Carry  - 1 x daily -  7 x weekly - 3 sets - 10 reps - Bilateral Shoulder Horizontal Abduction Adduction AROM  - 1 x daily - 7 x weekly - 3 sets - 10 reps - Standing Hip Hinge  - 1 x daily - 7 x weekly - 3 sets - 10  reps - Standing Bilateral Low Shoulder Row with Anchored Resistance  - 1 x daily - 7 x weekly - 3 sets - 10 reps - Standing Low Shoulder Row with Anchored Resistance  - 1 x daily - 7 x weekly - 3 sets - 10 reps - Seated Straddle on Flotation Forward Breast Stroke Arms and Bicycle Legs  - 1 x daily - 7 x weekly - 3 sets - 10 reps  LAND HEP: - Supine March  - 1 x daily - 7 x weekly - 2 sets - 10 reps - Supine Core Control with Leg Extension  - 1 x daily - 7 x weekly - 2 sets - 10 reps - Small Range Straight Leg Raise  - 1 x daily - 5-6 x weekly - 2 sets - 10 reps - Hooklying Clamshell with Resistance  - 1 x daily - 4 x weekly - 2 sets - 10 reps - Standing Shoulder Row with Anchored Resistance  - 1 x daily - 4-5 x weekly - 2 sets - 10 reps - Shoulder extension with resistance - Neutral  - 1 x daily - 4-5 x weekly - 2 sets - 10 reps  ASSESSMENT:  CLINICAL IMPRESSION: Pt is progressing well with pain and sx's.  He has completed aquatic therapy and is performing aquatic program on his own.  Pt has improved pain and presents to Rx without pain currently.  His worst pain has improved from 8-9/10 prior to 5-6/10 currently.  Pt has improved hip flexion strength on R and no change in L hip flexion and L hip ER strength.  Pt demonstrates improved self perceived disability with FOTO improving from 49 to 57.  Pt continues to receive injections and wants to have a longer time between needing injections.  He is limited with his walking program due to pain.  PT extensively reviewed and educated pt with land based HEP.  Pt has met all goals except partially meeting LTG #1,4.  PT updated goals with adding LTG #5.  Pt should continue to benefit from cont skilled PT services for improved pain, core strength, tolerance to activity, function, and to establish long term exercise program.      OBJECTIVE IMPAIRMENTS: decreased activity tolerance, decreased ROM, decreased strength, hypomobility, and pain.   ACTIVITY  LIMITATIONS: lifting, sitting, and bed mobility  PARTICIPATION LIMITATIONS: occupation  PERSONAL FACTORS: Time since onset of injury/illness/exacerbation are also affecting patient's functional outcome.   REHAB POTENTIAL: Good  CLINICAL DECISION MAKING: Stable/uncomplicated  EVALUATION COMPLEXITY: Low   GOALS:   SHORT TERM GOALS: Target date: 08/11/2022   Pt will tolerate aquatic therapy without adverse effects for improved tolerance to activity, core strength, and pain.  Baseline: Goal status: Met 10/15/22  2.  Pt will demo L rotation AROM to be [facility] for improved stiffness and mobility.  Baseline:  Goal status: Met 10/14/22  3.  Pt will report at least a 25% improvement in pain and sx's overall.   Baseline:  Goal status: MET 10/15/22     LONG TERM GOALS: Target date: 12/22/2022   Pt will demo improved hip strength to 5/5 bilat and improved core strength as evidenced by performance of core exercises without adverse  effects for improved performance of daily activities and fxnl mobility with less pain.  Baseline:  Goal status: PARTIALLY MET  2.  Pt will be able to perform bed mobility without increased pain and difficulty.  Baseline:  Goal status: Met 10/15/22  3.  Pt will be able to perform work activities without significant pain and limitation.   Baseline:  Goal status: Met 3/824  4.  Pt will be independent with aquatic program and land based HEP for improved pain, strength, ROM, and function.  Baseline:  Goal status:Partially Met (aquatics) 11/10/22  5. Pt will report at least a 70% improvement in pain and sx's overall.  Goal Status:  INITIAL    PLAN:  PT FREQUENCY: 1x/wk  PT DURATION: 4-6 weeks  PLANNED INTERVENTIONS: Therapeutic exercises, Therapeutic activity, Neuromuscular re-education, Gait training, Patient/Family education, Self Care, Joint mobilization, Stair training, Aquatic Therapy, Dry Needling, Electrical stimulation, Spinal mobilization, Cryotherapy,  Moist heat, Taping, Ultrasound, Manual therapy, and Re-evaluation.  PLAN FOR NEXT SESSION:  Re-cert completed and will send to MD.  Cont with land based therapy.    Selinda Michaels III PT, DPT 11/11/22 9:23 PM

## 2022-11-24 ENCOUNTER — Ambulatory Visit (INDEPENDENT_AMBULATORY_CARE_PROVIDER_SITE_OTHER): Payer: BC Managed Care – PPO | Admitting: Psychiatry

## 2022-11-24 DIAGNOSIS — F411 Generalized anxiety disorder: Secondary | ICD-10-CM | POA: Diagnosis not present

## 2022-11-24 NOTE — Progress Notes (Signed)
[facility] Counselor/Therapist Progress Note  Patient ID: Dustin Rice, MRN: [id number],    Date: 11/24/2022  Time Spent: 55 minutes   Treatment Type: Individual Therapy  Reported Symptoms: anxiety, frustration, tendency to hold things in too much especially within family relationships and this hurts relationships (ends up "blowing up")  Mental Status Exam:  Appearance:   Casual     Behavior:  Appropriate, Sharing, and Motivated  Motor:  Normal  Speech/Language:   Clear and Coherent  Affect:  Anxious, frustration  Mood:  anxious  Thought process:  goal directed  Thought content:    WNL  Sensory/Perceptual disturbances:    WNL  Orientation:  oriented to person, place, time/date, situation, day of week, month of year, year, and stated date of November 24, 2022  Attention:  Good  Concentration:  Good  Memory:  WNL  Fund of knowledge:   Good  Insight:    Good  Judgment:   Good  Impulse Control:  Good   Risk Assessment: Danger to Self:  No Self-injurious Behavior: No Danger to Others: No Duty to Warn:no Physical Aggression / Violence:No  Access to Firearms a concern: No  Gang Involvement:No   Subjective:  Patient in session today reporting anxiety, frustration, and tendency to "hold thoughts and feelings in and can easily end up blowing up". Wanted to work on this some in session today as he's noticed that he does this and doesn't always listen well to others. Happens with wife, family, customers. Worked with patient on some communication, frustration management, and verbalizing more calmly even when he is stressed, and practicing some improved communication skills, and really interested in working together better with his wife on their communication and relationship issues. Patient's mother's mental status worsening some with her dementia and that has added some stress for patient who is working to handle this more effectively. Lots of progress in his sensitivity towards  wife.   Interventions: Cognitive Behavioral Therapy and Ego-Supportive  Long term goal: Reduce overall level, frequency, and intensity of anxiety and depression so that daily functioning is not impaired. Short term goal: Increase understanding of beliefs and messages that produce worry and anxiety. Strategies: Identify and use specific coping strategies for anxiety and depression reduction.  Also explore alternative ways of communicating better with spouse and being more sensitive to needs within the family, including both wife and son.  Diagnosis:   ICD-10-CM   1. Generalized anxiety disorder  F41.1      Plan:     Patient in session today working further on his anxiety, communication issues that impact his marriage relationship, and also some concerns relating to help and support his mother with dementia and working with his siblings in improving family relationships as well as having appropriate boundaries.  Patient feeling more hopeful in some ways as he continues goal-directed behaviors in order to keep moving in a forward direction. Encouraged patient in her practice of more self affirming and positive behaviors as noted in session including: Positive self talk, taking breaks as needed, healthy nutrition and exercise, staying in touch with supportive people, staying in the present and focusing on the things he can control versus cannot control, allowing his faith to be an emotional support as well as spiritual, continue making his family a high priority including himself, and recognize the strength he shows working with goal-directed behaviors to move in a direction that supports his improved emotional health and wellbeing.  Goal review and progress/challenges noted  with patient.  Next appointment within 2 to 3 weeks.  This record has been created using [employer].  Chart creation errors have been sought, but may not always have been located and corrected.  Such creation errors do not  reflect on the standard of medical care provided   Mathis Fare, LCSW

## 2022-12-14 ENCOUNTER — Ambulatory Visit (INDEPENDENT_AMBULATORY_CARE_PROVIDER_SITE_OTHER): Payer: BC Managed Care – PPO | Admitting: Psychiatry

## 2022-12-14 DIAGNOSIS — F411 Generalized anxiety disorder: Secondary | ICD-10-CM | POA: Diagnosis not present

## 2022-12-14 NOTE — Progress Notes (Signed)
[facility] Counselor/Therapist Progress Note  Patient ID: Dustin Rice, MRN: [id number],    Date: 12/14/2022  Time Spent: 55 minutes   Treatment Type: Individual Therapy  Reported Symptoms: anxiety (improving some)   Mental Status Exam:  Appearance:   Casual     Behavior:  Appropriate, Sharing, and Motivated  Motor:  Normal  Speech/Language:   Clear and Coherent  Affect:  anxious  Mood:  anxious  Thought process:  goal directed  Thought content:    WNL  Sensory/Perceptual disturbances:    WNL  Orientation:  oriented to person, place, time/date, situation, day of week, month of year, year, and stated date of Dec 14, 2022  Attention:  Good  Concentration:  Good  Memory:  WNL  Fund of knowledge:   Good  Insight:    Good  Judgment:   Good  Impulse Control:  Good   Risk Assessment: Danger to Self:  No Self-injurious Behavior: No Danger to Others: No Duty to Warn:no Physical Aggression / Violence:No  Access to Firearms a concern: No  Gang Involvement:No   Subjective:  Patient in session today working further on his anxiety, better management of frustrations, and listening/communication skills so that he can refrain from "holding and" thoughts and feelings that can easily lead him to "blow up" in conversation mostly within family context.  Definitely wanting to be a better listener when interacting with others, especially wife and other family. Today needing session to talk through recent family dynamics, family health issues,including wife's diabetes, and communication challenges with wife. (Not all details included in this note due to patient privacy needs.) Continues working on improving communication especially within his marriage and especially when stressed. Patient continues to help oversee care and supervision for his elderly mother.  Continues to improve within the marital relationship.  Interventions: Cognitive Behavioral Therapy and Ego-Supportive  Long term  goal: Reduce overall level, frequency, and intensity of anxiety and depression so that daily functioning is not impaired. Short term goal: Increase understanding of beliefs and messages that produce worry and anxiety. Strategies: Identify and use specific coping strategies for anxiety and depression reduction.  Also explore alternative ways of communicating better with spouse and being more sensitive to needs within the family, including both wife and son.   Diagnosis:   ICD-10-CM   1. Generalized anxiety disorder  F41.1      Plan:    Patient in today as he continues working on his anxiety, family concerns, issues related to supporting his wife with her diabetes where there is been some instability, his elderly mother with dementia and trying to coordinate care with siblings, and communication issues within his marital relationship which is improving Focusing on healthy boundaries and relationships as he continues working with goal-directed behaviors and is showing progress. Encouraged patient and practicing more positive and self affirming behaviors as noted in session including: Healthy nutrition and exercise, staying in touch with supportive people, taking breaks as needed, positive self talk and self-care, stay in the present and focused on the things he can control versus cannot control, allow his faith to be an emotional support as well as spiritual, continue making his family a high priority including himself, and realize the strength he shows working with goal-directed behaviors to move in a direction that supports his improved emotional health and outlook.  Goal review and progress/challenges noted with patient.  Next appointment within 3 weeks.   Mathis Fare, LCSW

## 2022-12-15 ENCOUNTER — Encounter (HOSPITAL_BASED_OUTPATIENT_CLINIC_OR_DEPARTMENT_OTHER): Payer: BC Managed Care – PPO | Admitting: Physical Therapy

## 2022-12-16 DIAGNOSIS — M5116 Intervertebral disc disorders with radiculopathy, lumbar region: Secondary | ICD-10-CM | POA: Diagnosis not present

## 2022-12-16 DIAGNOSIS — M5416 Radiculopathy, lumbar region: Secondary | ICD-10-CM | POA: Diagnosis not present

## 2022-12-26 NOTE — Therapy (Incomplete)
OUTPATIENT PHYSICAL THERAPY TREATMENT NOTE  Progress Note Reporting Period 11/10/22 to 12/27/22  See note below for Objective Data and Assessment of Progress/Goals.     Patient Name: Dustin Rice MRN: [id number] DOB:07-04-1956, 67 y.o., male Today's Date: 12/28/2022  END OF SESSION:  PT End of Session - 12/27/22 1515     Visit Number 16    Number of Visits 18    Date for PT Re-Evaluation 01/24/23    Authorization Type AET[employer]    PT Start Time 1409    PT Stop Time 1453    PT Time Calculation (min) 44 min    Activity Tolerance Patient tolerated treatment well    Behavior During Therapy [facility] for tasks assessed/performed                   Past Medical History:  Diagnosis Date   Allergic rhinitis    Cardiovascular risk factor 08/2013   10 YEARS   Cervical spine disease    Dyspnea    Esophageal reflux    GERD (gastroesophageal reflux disease)    Gilbert's syndrome    Hypercholesterolemia    Hyperlipidemia    Inguinal hernia    RIGHT   Palpitations    Prostatitis    RBBB    Shingles 03/2014   Past Surgical History:  Procedure Laterality Date   HERNIA REPAIR  2004   Patient Active Problem List   Diagnosis Date Noted   Palpitations 05/12/2016     REFERRING PROVIDER: Barnett Abu, MD  REFERRING DIAG: [phone] (ICD-10-CM) - Spinal stenosis, lumbar region without neurogenic claudication  Rationale for Evaluation and Treatment: Rehabilitation  THERAPY DIAG:  Other low back pain  Muscle weakness (generalized)  ONSET DATE: Onset Nov 2021 ; PT order 06/25/2022  SUBJECTIVE:                                                                                                                                                                                           SUBJECTIVE STATEMENT: Pt was last seen on 11/10/22.  Pt had to cancel his following appt, and had a difficult time scheduling due to clinic's schedule availability.  He also had lumbar injections 12/16/22.   Pt states he was probably feeling the best he had prior to an injection.  Pt was having a 2-3/10 pain prior to the injection.  He responded well to injection having improved sx's stating he is feeling really good.  Pt states his pain hasn't been over a 1/10 pain since he had the injection.  Pt is compliant with land based HEP and aquatic HEP.  The binder provides sx's and  he has not worn the binder in 2 weeks.  Pt limits his lifting in order to not irritate his back and cause pain.  Pt states he feels better moving around, walking, stairs, and getting in/out of his truck.  Pt feels better walking around the work sites.  Pt reports 70% improvement overall in pain and sx's.           PERTINENT HISTORY:  Grade 1 anterolisthesis L3-L4 and L4-5  PAIN:  NPRS:  0/10 currently ; 1/10 worst since the injection; 0/10 best Location:  R sided lower lumbar flank, tingling in toes.  Pt states if he leans backward just a little bit, his tingling increases.  PRECAUTIONS: Other: MRI findings  WEIGHT BEARING RESTRICTIONS: No  FALLS:  Has patient fallen in last 6 months? 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   PLOF: Independent.  Pt has had pain for 2 years which has affected his daily activities and tolerance to activityy.   PATIENT GOALS: To be able to go longer than 3 months between injections.  To improve core strength.  To improve pain.  To avoid surgery    OBJECTIVE:   DIAGNOSTIC FINDINGS:  X rays in 2021:  Lumbar:  Well-preserved lumbar lordosis and intervertebral disc spaces.  Moderate  to severe lumbar spondylosis   Lumbar MRI in 2022: INDINGS:  #  Grade 1 anterolisthesis L3-4 and L4-5.  #  Vertebral body heights are well maintained.  #  Small meningioma within L2 and L4.  #  Conus terminates at T12-L1 without evidence of tethering.  #  Nerve roots appear normal.  #  Incidental findings: None.     #  L1-2: Normal.  #    #  L2-3: Normal.  #     #  L3-4: Mild degenerative disc disease. Grade 1 anterolisthesis. There is facet arthropathy and disc bulge causing moderate central canal stenosis with mild bilateral neural foraminal narrowing.  #    #  L4-5: Mild degenerative disc disease. Grade 1 anterolisthesis. Facet arthropathy and disc bulge causing moderate to severe central canal stenosis and mild bilateral neural foraminal narrowing.  #    #  L5-S1: Facet arthropathy and disc bulge without significant central canal stenosis or neuroforaminal narrowing.     IMPRESSION:    There is facet arthropathy and disc bulge as well as grade 1 anterolisthesis causing moderate to severe central canal stenosis at the level of L4-5 and moderate canal stenosis at L3-4.      Today's Treatment:  Reviewed current function, pain levels, pt presentation, and HEP compliance.   LE Strength: Hip flexion:  R:  5/5, L:  5/5 Hip ER:  R:  5/5, L:  5/5 Hip abd:  5/5 bilat   PATIENT SURVEYS:  FOTO:  Prior / Current:  57 / 56.  Goal of 62.  LUMBAR ROM:    AROM eval  Flexion WFL  Extension NT  Right lateral flexion WFL  Left lateral flexion WFL  Right rotation WFL with "a tiny bit of pain"  Left rotation WFL without pain    Gait:  Pt ambulate with a normalized heel to toe gait pattern without limping.     -Pt performed: Rows with retraction and with TrA with GTB 2x10 Standing shoulder ext with retraction with TrA with GTB 2x10   See below for pt education.   PATIENT EDUCATION:  Education details:  PT spent time answering questions including  concerning appropriate gym exercises and cardiovascular exercise including walking.  Educated pt concerning exercise form and rationale, HEP, objective findings, progress, and POC.  Person educated: Patient Education method: Medical illustrator, verbal cues Education comprehension: verbalized understanding and needs further education  HOME EXERCISE PROGRAM:  Pt has a HEP  Added  aquatics: Access Code: [email or web address] URL: [email or web address] Date: 07/30/2022 Prepared by: Geni Bers  Exercises Modified land based exercises for aquatic use - Kettlebell Suitcase Carry  - 1 x daily - 7 x weekly - 3 sets - 10 reps - Bilateral Shoulder Horizontal Abduction Adduction AROM  - 1 x daily - 7 x weekly - 3 sets - 10 reps - Standing Hip Hinge  - 1 x daily - 7 x weekly - 3 sets - 10 reps - Standing Bilateral Low Shoulder Row with Anchored Resistance  - 1 x daily - 7 x weekly - 3 sets - 10 reps - Standing Low Shoulder Row with Anchored Resistance  - 1 x daily - 7 x weekly - 3 sets - 10 reps - Seated Straddle on Flotation Forward Breast Stroke Arms and Bicycle Legs  - 1 x daily - 7 x weekly - 3 sets - 10 reps  LAND HEP: - Supine March  - 1 x daily - 7 x weekly - 2 sets - 10 reps - Supine Core Control with Leg Extension  - 1 x daily - 7 x weekly - 2 sets - 10 reps - Small Range Straight Leg Raise  - 1 x daily - 5-6 x weekly - 2 sets - 10 reps - Hooklying Clamshell with Resistance  - 1 x daily - 4 x weekly - 2 sets - 10 reps - Standing Shoulder Row with Anchored Resistance  - 1 x daily - 4-5 x weekly - 2 sets - 10 reps - Shoulder extension with resistance - Neutral  - 1 x daily - 4-5 x weekly - 2 sets - 10 reps  ASSESSMENT:  CLINICAL IMPRESSION: Pt has not been seen in PT since his last progress note on 4/3.  He has been absent from PT due to clinic's schedule availability and also having lumbar injections.  He is actually doing quite well.  Pt has made great progress.  Pt has much improved pain since lumbar injections though states he was feeling the best he has felt prior to injections also.  He has been performing his land based HEP and aquatic HEP.  Pt reports 70% improvement in pain and sx's overall.  Pt has good lumbar AROM.  He demonstrates improved L hip strength.  Pt has made great progress toward goals.  He met all goals except partially meeting LTG #4.  Pt  should benefit from cont skilled PT services for 1-2 more visits to establish independence with land based HEP and monitor sx's.      OBJECTIVE IMPAIRMENTS: decreased activity tolerance, decreased ROM, decreased strength, hypomobility, and pain.   ACTIVITY LIMITATIONS: lifting, sitting, and bed mobility  PARTICIPATION LIMITATIONS: occupation  PERSONAL FACTORS: Time since onset of injury/illness/exacerbation are also affecting patient's functional outcome.   REHAB POTENTIAL: Good  CLINICAL DECISION MAKING: Stable/uncomplicated  EVALUATION COMPLEXITY: Low   GOALS:   SHORT TERM GOALS: Target date: 08/11/2022   Pt will tolerate aquatic therapy without adverse effects for improved tolerance to activity, core strength, and pain.  Baseline: Goal status: Met 10/15/22  2.  Pt will demo L rotation AROM to be [facility] for improved stiffness and mobility.  Baseline:  Goal status: Met 10/14/22  3.  Pt will report at least a 25% improvement in pain and sx's overall.   Baseline:  Goal status: MET 10/15/22     LONG TERM GOALS: Target date: 12/22/2022   Pt will demo improved hip strength to 5/5 bilat and improved core strength as evidenced by performance of core exercises without adverse effects for improved performance of daily activities and fxnl mobility with less pain.  Baseline:  Goal status: GOAL MET  5/20  2.  Pt will be able to perform bed mobility without increased pain and difficulty.  Baseline:  Goal status: Met 10/15/22  3.  Pt will be able to perform work activities without significant pain and limitation.   Baseline:  Goal status: Met 10/15/22  4.  Pt will be independent with aquatic program and land based HEP for improved pain, strength, ROM, and function.  Baseline:  Goal status:Partially Met (aquatics) 12/27/22 Target date:  01/24/2023  5. Pt will report at least a 70% improvement in pain and sx's overall.  Goal Status: GOAL MET 5/20    PLAN:  PT FREQUENCY: 1-2 more  visits  PT DURATION: 4 weeks  PLANNED INTERVENTIONS: Therapeutic exercises, Therapeutic activity, Neuromuscular re-education, Gait training, Patient/Family education, Self Care, Joint mobilization, Stair training, Aquatic Therapy, Dry Needling, Electrical stimulation, Spinal mobilization, Cryotherapy, Moist heat, Taping, Ultrasound, Manual therapy, and Re-evaluation.  PLAN FOR NEXT SESSION:  Re-cert completed and will send to MD.  Pt to return in 2 weeks to determine how pt is doing and to establish independence with land based HEP  Audie Clear III PT, DPT 12/28/22 11:36 AM

## 2022-12-27 ENCOUNTER — Ambulatory Visit (HOSPITAL_BASED_OUTPATIENT_CLINIC_OR_DEPARTMENT_OTHER): Payer: BC Managed Care – PPO | Attending: Neurological Surgery | Admitting: Physical Therapy

## 2022-12-27 DIAGNOSIS — M5459 Other low back pain: Secondary | ICD-10-CM | POA: Diagnosis not present

## 2022-12-27 DIAGNOSIS — M6281 Muscle weakness (generalized): Secondary | ICD-10-CM | POA: Insufficient documentation

## 2022-12-28 ENCOUNTER — Ambulatory Visit (INDEPENDENT_AMBULATORY_CARE_PROVIDER_SITE_OTHER): Payer: BC Managed Care – PPO | Admitting: Psychiatry

## 2022-12-28 ENCOUNTER — Encounter (HOSPITAL_BASED_OUTPATIENT_CLINIC_OR_DEPARTMENT_OTHER): Payer: Self-pay | Admitting: Physical Therapy

## 2022-12-28 DIAGNOSIS — F411 Generalized anxiety disorder: Secondary | ICD-10-CM

## 2022-12-28 NOTE — Progress Notes (Signed)
[facility] Counselor/Therapist Progress Note  Patient ID: Dustin Rice, MRN: [id number],    Date: 12/28/2022  Time Spent: 50 minutes   Treatment Type: Individual Therapy  Reported Symptoms: anxiety  Mental Status Exam:  Appearance:   Neat     Behavior:  Appropriate, Sharing, and Motivated  Motor:  Normal  Speech/Language:   Clear and Coherent  Affect:  anxious  Mood:  anxious  Thought process:  goal directed  Thought content:    WNL  Sensory/Perceptual disturbances:    WNL  Orientation:  oriented to person, place, time/date, situation, day of week, month of year, year, and stated date of Dec 28, 2022  Attention:  Good  Concentration:  Good  Memory:  WNL  Fund of knowledge:   Good  Insight:    Good  Judgment:   Good  Impulse Control:  Good   Risk Assessment: Danger to Self:  No Self-injurious Behavior: No Danger to Others: No Duty to Warn:no Physical Aggression / Violence:No  Access to Firearms a concern: No  Gang Involvement:No   Subjective:   Patient in for session today reporting anxiety, frustrations within family situations, and wanting to improve communication within marriage and family and already showing some progress especially with wife. Is making progress in managing his anxiety and also in improving his communication skills within his marriage as he and his wife are working together on this. Shared several examples in session today and able to comment on the positive difference they both have experienced, even though it is a work in progress.  Continued to work with some specific examples involving communication that patient brought up today, including the importance of listening in relationships.  Communication skills improving and marriage and also within his family and extended family as he is very aware of certain communication skills he needs to sharpen and some parts of his communication that he wants to stop.  Interventions: Cognitive Behavioral  Therapy and Ego-Supportive  Long term goal: Reduce overall level, frequency, and intensity of anxiety and depression so that daily functioning is not impaired. Short term goal: Increase understanding of beliefs and messages that produce worry and anxiety. Strategies: Identify and use specific coping strategies for anxiety and depression reduction.  Also explore alternative ways of communicating better with spouse and being more sensitive to needs within the family, including both wife and son.   Diagnosis:   ICD-10-CM   1. Generalized anxiety disorder  F41.1      Plan:  Patient in today actively participating as he focused more on his anxiety, issues relating to his wife and some health concerns particularly diabetes, family concerns, and his elderly mother with dementia as he coordinates care for her with his siblings, and communication issues within his marital relationship which he notes is improving.  Personal boundaries have become healthier.  Is making progress and needs to continue working with goal-directed behaviors in order to keep moving in a forward direction. Encouraged patient in his practice of more positive and self affirming behaviors as noted in session including: Staying in touch with supportive people, taking breaks as needed, positive self talk and self-care, healthy nutrition and exercise, stay in the present and focused on the things he can control versus cannot, allow his faith to be an emotional support as well as spiritual, continue making his family a high priority including himself, and recognize the strength he shows working with goal-directed behaviors to move in a direction that supports his improved  emotional health and outlook.  Self rating scales: 1-10 depression scale-1 1-10 anxiety scale-5 1-10 self-esteem scale-8 1-10 motivation scale-8 1-10 hopefulness scale-7  Goal review and progress/challenges noted with patient.  Next appointment within 2 to 3  weeks.   Mathis Fare, LCSW

## 2023-01-17 ENCOUNTER — Encounter (HOSPITAL_BASED_OUTPATIENT_CLINIC_OR_DEPARTMENT_OTHER): Payer: Self-pay | Admitting: Physical Therapy

## 2023-01-17 ENCOUNTER — Ambulatory Visit (HOSPITAL_BASED_OUTPATIENT_CLINIC_OR_DEPARTMENT_OTHER): Payer: BC Managed Care – PPO | Attending: Neurological Surgery | Admitting: Physical Therapy

## 2023-01-17 DIAGNOSIS — M5459 Other low back pain: Secondary | ICD-10-CM | POA: Diagnosis not present

## 2023-01-17 DIAGNOSIS — M6281 Muscle weakness (generalized): Secondary | ICD-10-CM | POA: Diagnosis not present

## 2023-01-17 NOTE — Therapy (Addendum)
OUTPATIENT PHYSICAL THERAPY TREATMENT NOTE / PT DISCHARGE.     Patient Name: Dustin Rice MRN: [id number] DOB:[birth year], 67 y.o., male Today's Date: 01/17/2023  END OF SESSION:  PT End of Session - 01/17/23 1116     Visit Number 17    Number of Visits 17    Date for PT Re-Evaluation 01/24/23    PT Start Time 1019    PT Stop Time 1111    PT Time Calculation (min) 52 min    Activity Tolerance Patient tolerated treatment well    Behavior During Therapy [facility] for tasks assessed/performed                   Past Medical History:  Diagnosis Date   Allergic rhinitis    Cardiovascular risk factor 08/2013   10 YEARS   Cervical spine disease    Dyspnea    Esophageal reflux    GERD (gastroesophageal reflux disease)    Gilbert's syndrome    Hypercholesterolemia    Hyperlipidemia    Inguinal hernia    RIGHT   Palpitations    Prostatitis    RBBB    Shingles 03/2014   Past Surgical History:  Procedure Laterality Date   HERNIA REPAIR  2004   Patient Active Problem List   Diagnosis Date Noted   Palpitations 05/12/2016     REFERRING PROVIDER: Barnett Abu, MD  REFERRING DIAG: [phone] (ICD-10-CM) - Spinal stenosis, lumbar region without neurogenic claudication  Rationale for Evaluation and Treatment: Rehabilitation  THERAPY DIAG:  Other low back pain  Muscle weakness (generalized)  ONSET DATE: Onset Nov 2021 ; PT order 06/25/2022  SUBJECTIVE:                                                                                                                                                                                           SUBJECTIVE STATEMENT: Pt states his back has still been doing good.  He hasn't had the tingling.  He has been busy since last visit.  He has been out of town some.  He hasn't been in the pool as much and hasn't done his land HEP as much.  He has been doing the land exercises though not as much as he was.  Pt is appreciative of skilled  PT services.        PERTINENT HISTORY:  Grade 1 anterolisthesis L3-L4 and L4-5  PAIN:  NPRS:  2-3/10 currently Location:  R sided lower lumbar flank, tingling in toes.  Pt states if he leans backward just a little bit, his tingling increases.  PRECAUTIONS: Other: MRI findings  WEIGHT BEARING RESTRICTIONS: No  FALLS:  Has patient fallen in last 6 months? 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   PLOF: Independent.  Pt has had pain for 2 years which has affected his daily activities and tolerance to activityy.   PATIENT GOALS: To be able to go longer than 3 months between injections.  To improve core strength.  To improve pain.  To avoid surgery    OBJECTIVE:   DIAGNOSTIC FINDINGS:  X rays in 2021:  Lumbar:  Well-preserved lumbar lordosis and intervertebral disc spaces.  Moderate  to severe lumbar spondylosis   Lumbar MRI in 2022: INDINGS:  #  Grade 1 anterolisthesis L3-4 and L4-5.  #  Vertebral body heights are well maintained.  #  Small meningioma within L2 and L4.  #  Conus terminates at T12-L1 without evidence of tethering.  #  Nerve roots appear normal.  #  Incidental findings: None.     #  L1-2: Normal.  #    #  L2-3: Normal.  #    #  L3-4: Mild degenerative disc disease. Grade 1 anterolisthesis. There is facet arthropathy and disc bulge causing moderate central canal stenosis with mild bilateral neural foraminal narrowing.  #    #  L4-5: Mild degenerative disc disease. Grade 1 anterolisthesis. Facet arthropathy and disc bulge causing moderate to severe central canal stenosis and mild bilateral neural foraminal narrowing.  #    #  L5-S1: Facet arthropathy and disc bulge without significant central canal stenosis or neuroforaminal narrowing.     IMPRESSION:    There is facet arthropathy and disc bulge as well as grade 1 anterolisthesis causing moderate to severe central canal stenosis at the level of L4-5 and moderate  canal stenosis at L3-4.      Today's Treatment:  -Pt performed:  Supine alt LE ext x10 each with TrA while holding ball/2# at 90 deg flexion Supine alt UE/LE with TrA x10 Supine SLR with TrA x 10 and with contralat UE extension x 10 Supine hip extension arc 2x10 PPT x10 with TA Supine clams with PPT 2x10 with GTB Supine lumbar rotation  x 10 reps Standing rows with GTB with TrA x10 and with cable 10# and 12.5# Standing shoulder extension with TB x10 and with cable 10# and 12.5#  -PT extensively went through HEP educating pt in correct exercises, correct form, appropriate resistance, and appropriate frequency.  PT updated land HEP and gave pt a HEP handout.    -FOTO:  56 prior which improved to 60 today.  Goal of 62.  See below for pt education.   PATIENT EDUCATION:  Education details:  Exercise form, HEP, and POC/discharge planning.  PT answered questions. Person educated: Patient Education method: Medical illustrator, verbal cues, handout Education comprehension: verbalized understanding and needs further education  HOME EXERCISE PROGRAM:  aquatics: Access Code: [email or web address] URL: [email or web address] Date: 07/30/2022 Prepared by: Geni Bers  Exercises Modified land based exercises for aquatic use - Kettlebell Suitcase Carry  - 1 x daily - 7 x weekly - 3 sets - 10 reps - Bilateral Shoulder Horizontal Abduction Adduction AROM  - 1 x daily - 7 x weekly - 3 sets - 10 reps - Standing Hip Hinge  - 1 x daily - 7 x weekly - 3 sets - 10 reps - Standing Bilateral Low Shoulder Row with Anchored Resistance  - 1 x daily - 7 x weekly - 3 sets - 10 reps - Standing Low  Shoulder Row with Anchored Resistance  - 1 x daily - 7 x weekly - 3 sets - 10 reps - Seated Straddle on Flotation Forward Breast Stroke Arms and Bicycle Legs  - 1 x daily - 7 x weekly - 3 sets - 10 reps  LAND HEP: - Supine March  - 1 x daily - 7 x weekly - 2 sets - 10 reps - Supine Core Control  with Leg Extension  - 1 x daily - 7 x weekly - 2 sets - 10 reps - Small Range Straight Leg Raise  - 1 x daily - 5-6 x weekly - 2 sets - 10 reps - Hooklying Clamshell with Resistance  - 1 x daily - 4 x weekly - 2 sets - 10 reps - Standing Shoulder Row with Anchored Resistance  - 1 x daily - 4-5 x weekly - 2 sets - 10 reps - Shoulder extension with resistance - Neutral  - 1 x daily - 4-5 x weekly - 2 sets - 10 reps Updated HEP:  Supine 90/90 Alternating Toe Touch  - 1 x daily - 5 x weekly - 2 sets - 5 reps  ASSESSMENT:  CLINICAL IMPRESSION: Pt has made great progress.  His lumbar has been doing well and he is appreciative of skilled PT services.  PT already has an aquatic HEP and PT worked on independence with updated land based HEP today.  PT extensively went through HEP and educated pt in correct exercises, appropriate frequency, and how to progress exercises.  Pt performed exercises well and demonstrates good understanding of HEP and progression.  He had no c/o's with exercises.  He responded well to Rx stating he felt better after Rx than before Rx.  He is independent with land based HEP.  Pt has met all goals and is ready for discharge.     OBJECTIVE IMPAIRMENTS: decreased activity tolerance, decreased ROM, decreased strength, hypomobility, and pain.   ACTIVITY LIMITATIONS: lifting, sitting, and bed mobility  PARTICIPATION LIMITATIONS: occupation  PERSONAL FACTORS: Time since onset of injury/illness/exacerbation are also affecting patient's functional outcome.   REHAB POTENTIAL: Good  CLINICAL DECISION MAKING: Stable/uncomplicated  EVALUATION COMPLEXITY: Low   GOALS:   SHORT TERM GOALS: Target date: 08/11/2022   Pt will tolerate aquatic therapy without adverse effects for improved tolerance to activity, core strength, and pain.  Baseline: Goal status: Met 10/15/22  2.  Pt will demo L rotation AROM to be [facility] for improved stiffness and mobility.  Baseline:  Goal status: Met  10/14/22  3.  Pt will report at least a 25% improvement in pain and sx's overall.   Baseline:  Goal status: MET 10/15/22     LONG TERM GOALS: Target date: 12/22/2022   Pt will demo improved hip strength to 5/5 bilat and improved core strength as evidenced by performance of core exercises without adverse effects for improved performance of daily activities and fxnl mobility with less pain.  Baseline:  Goal status: GOAL MET  5/20  2.  Pt will be able to perform bed mobility without increased pain and difficulty.  Baseline:  Goal status: Met 10/15/22  3.  Pt will be able to perform work activities without significant pain and limitation.   Baseline:  Goal status: Met 10/15/22  4.  Pt will be independent with aquatic program and land based HEP for improved pain, strength, ROM, and function.  Baseline:  Goal status:GOAL Met 01/17/2023 Target date:  01/24/2023  5. Pt will report at least a  70% improvement in pain and sx's overall.  Goal Status: GOAL MET 5/20    PLAN:  PLANNED INTERVENTIONS: Therapeutic exercises, Therapeutic activity, Neuromuscular re-education, Gait training, Patient/Family education, Self Care, Joint mobilization, Stair training, Aquatic Therapy, Dry Needling, Electrical stimulation, Spinal mobilization, Cryotherapy, Moist heat, Taping, Ultrasound, Manual therapy, and Re-evaluation.  PLAN FOR NEXT SESSION:  Pt to be discharged from skilled PT services due to meeting all goals. He is agreeable with discharge and will cont with HEP.    PHYSICAL THERAPY DISCHARGE SUMMARY  Visits from Start of Care: 17  Current functional level related to goals / functional outcomes: See above   Remaining deficits: See above   Education / Equipment: Pt has a HEP.     Audie Clear III PT, DPT 01/17/23 11:36 AM

## 2023-01-18 ENCOUNTER — Ambulatory Visit (INDEPENDENT_AMBULATORY_CARE_PROVIDER_SITE_OTHER): Payer: BC Managed Care – PPO | Admitting: Psychiatry

## 2023-01-18 DIAGNOSIS — F411 Generalized anxiety disorder: Secondary | ICD-10-CM

## 2023-01-18 NOTE — Progress Notes (Signed)
[facility] Counselor/Therapist Progress Note  Patient ID: Dustin Rice, MRN: [id number],    Date: 01/18/2023  Time Spent: 52 minutes   Treatment Type: Individual Therapy  Reported Symptoms: anxiety  Mental Status Exam:  Appearance:   Casual     Behavior:  Appropriate, Sharing, and Motivated  Motor:  Normal  Speech/Language:   Clear and Coherent  Affect:  anxious  Mood:  anxious  Thought process:  goal directed  Thought content:    WNL  Sensory/Perceptual disturbances:    WNL  Orientation:  oriented to person, place, time/date, situation, day of week, month of year, year, and stated date of January 18, 2023  Attention:  Good  Concentration:  Good  Memory:  WNL  Fund of knowledge:   Good  Insight:    Good  Judgment:   Good  Impulse Control:  Good   Risk Assessment: Danger to Self:  No Self-injurious Behavior: No Danger to Others: No Duty to Warn:no Physical Aggression / Violence:No  Access to Firearms a concern: No  Gang Involvement:No   Subjective:   Patient in for session today and reports continued efforts to improve communication within his marriage and family, anxiety, frustrations, but feeling that he is managing frustrations and healthier ways compared to how he handled them in the past. Continues working on marital issues with wife and this is challenging but they are making progress. Wife's diabetes and uncertainty more problematic recently. Both focusing more on improving their communication skills. Today also discussed healthier communication in setting limits with other family members as needed. Trying to pull together with wife and work together as they improve quality of their relationship and communication to have more quality time together.   Interventions: Cognitive Behavioral Therapy and Ego-Supportive  Long term goal: Reduce overall level, frequency, and intensity of anxiety and depression so that daily functioning is not impaired. Short term  goal: Increase understanding of beliefs and messages that produce worry and anxiety. Strategies: Identify and use specific coping strategies for anxiety and depression reduction.  Also explore alternative ways of communicating better with spouse and being more sensitive to needs within the family, including both wife and son.  Diagnosis:   ICD-10-CM   1. Generalized anxiety disorder  F41.1      Plan:   Patient today in session showing good motivation and active participation as he worked further on his anxiety, family concerns, helping meet needs of his elderly mother with dementia as he coordinates the care for her with his siblings, and health issues relating to his wife particularly her diabetes.  He reports that he and his wife are continuing to work well together on improving their marriage relationship particularly in their communication and and having a more joint effort and trying to keep their marriage healthy and happy.  Noticing especially that their boundaries are healthier and their overall communication in both talking and listening and feeling heard, is strengthening.  Patient is definitely making progress and needs to continue working with goal-directed behaviors to keep moving in a forward direction. Encouraged patient to practice more positive and self affirming behaviors as noted in session including: Remaining in touch with supportive people, taking breaks as needed, positive self talk and self-care, healthy nutrition and exercise, stay in the present and focused on the things he can control, continue working on therapy exercises with wife, allow his faith to be an emotional support as well as spiritual, continue making his family a high priority  including himself, and realize the strength he shows working with goal-directed behaviors to move in a direction that supports his improved emotional health and overall wellbeing.  Self rating scales: 1-10 depression scale-2/3 1-10 anxiety  scale-7 1-10 self-esteem scale-8 1-10 motivation scale-8 1-10 hopefulness scale-8  Goal review and progress/challenges noted with patient.  Next appointment within 3 weeks.   Mathis Fare, LCSW

## 2023-02-01 ENCOUNTER — Ambulatory Visit (INDEPENDENT_AMBULATORY_CARE_PROVIDER_SITE_OTHER): Payer: BC Managed Care – PPO | Admitting: Psychiatry

## 2023-02-01 DIAGNOSIS — F411 Generalized anxiety disorder: Secondary | ICD-10-CM | POA: Diagnosis not present

## 2023-02-01 NOTE — Progress Notes (Signed)
[facility] Counselor/Therapist Progress Note  Patient ID: Dustin Rice, MRN: [id number],    Date: 02/01/2023  Time Spent: 55 minutes   Treatment Type: Individual Therapy  Reported Symptoms: anxiety, frustration with being pulled in different directions to help with multiple family members  Mental Status Exam:  Appearance:   Casual     Behavior:  Appropriate, Sharing, and Motivated  Motor:  Normal  Speech/Language:   Clear and Coherent  Affect:  anxious  Mood:  anxious  Thought process:  goal directed  Thought content:    WNL  Sensory/Perceptual disturbances:    WNL  Orientation:  oriented to person, place, time/date, situation, day of week, month of year, year, and stated date of February 01, 2023  Attention:  Good  Concentration:  Good  Memory:  WNL  Fund of knowledge:   Good  Insight:    Good  Judgment:   Good  Impulse Control:  Good   Risk Assessment: Danger to Self:  No Self-injurious Behavior: No Danger to Others: No Duty to Warn:no Physical Aggression / Violence:No  Access to Firearms a concern: No  Gang Involvement:No   Subjective:   Patient in for session and reporting anxiety and feeling frustrated/stressed getting pulled in multiple directions by 3 people in the family, adding to his already heightened anxiety and stress. 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 Some increase in marital stressors and difficult communications which patient processed today.Particularly working on his responses to wife and paying attention to "how he says what he says." Also worked today on setting necessary boundaries within family and being able to say "no" when needed without guilt. Focus also on communication more with wife and others in family. Did some good practice work with this near session end which he felt was helpful. Trying to manage frustration in healthier ways.Working on  [employer]" better with wife in their marriage.Helping oversee care of elderly mother which can be stressful but is trying to set better limits and allow himself "breaks" more often.  Interventions: Cognitive Behavioral Therapy and Ego-Supportive  Long term goal: Reduce overall level, frequency, and intensity of anxiety and depression so that daily functioning is not impaired. Short term goal: Increase understanding of beliefs and messages that produce worry and anxiety. Strategies: Identify and use specific coping strategies for anxiety and depression reduction.  Also explore alternative ways of communicating better with spouse and being more sensitive to needs within the family, including both wife and son.  Diagnosis:   ICD-10-CM   1. Generalized anxiety disorder  F41.1      Plan:  Patient in today with good participation and motivation in session working more on his family concerns, his wife's health issues particularly diabetes, anxiety, trying to look after the needs of his elderly mother with dementia and also coordinate the care for her with his siblings.  Patient and wife have been working diligently to improve their communication and understanding between each other with some positive results as they continue to work on this.  The positives in this are helping patient in some of the work he is doing trying to make positive changes within himself and needs to continue working on his therapy goals in connection with his treatment here. Strengthening healthier boundaries. Encouraged patient in practicing more positive and self affirming behaviors as noted in session including: Committed focus on his treatment goal plan  here and following through on specific steps as discussed in sessions, remaining in touch with supportive people, taking breaks as needed, positive self talk and self-care, healthy nutrition and exercise, stay in the present and focused on the things he can control, continue  working on therapy exercises with wife, allow his faith to be an emotional support as well as spiritual, continue making his family high priority including himself, and realize the strength he shows working with goal-directed behaviors to move in a direction that supports his improved emotional health and outlook.  Self rating scales: (updating 02/01/2023) 1-10 depression scale-2/3 1-10 anxiety scale-7 1-10 self-esteem scale-8 1-10 motivation scale-8 1-10 hopefulness scale-8  Goal review and progress/challenges noted with patient.  Next appointment within 2 to 3 weeks.   Mathis Fare, LCSW

## 2023-03-01 ENCOUNTER — Ambulatory Visit: Payer: BC Managed Care – PPO | Admitting: Psychiatry

## 2023-03-16 DIAGNOSIS — M4316 Spondylolisthesis, lumbar region: Secondary | ICD-10-CM | POA: Diagnosis not present

## 2023-03-16 DIAGNOSIS — Z6828 Body mass index (BMI) 28.0-28.9, adult: Secondary | ICD-10-CM | POA: Diagnosis not present

## 2023-03-22 ENCOUNTER — Ambulatory Visit: Payer: BC Managed Care – PPO | Admitting: Psychiatry

## 2023-03-24 DIAGNOSIS — M5116 Intervertebral disc disorders with radiculopathy, lumbar region: Secondary | ICD-10-CM | POA: Diagnosis not present

## 2023-03-24 DIAGNOSIS — M5416 Radiculopathy, lumbar region: Secondary | ICD-10-CM | POA: Diagnosis not present

## 2023-03-29 ENCOUNTER — Other Ambulatory Visit (INDEPENDENT_AMBULATORY_CARE_PROVIDER_SITE_OTHER): Payer: BC Managed Care – PPO

## 2023-03-29 ENCOUNTER — Ambulatory Visit (INDEPENDENT_AMBULATORY_CARE_PROVIDER_SITE_OTHER): Payer: BC Managed Care – PPO | Admitting: Orthopaedic Surgery

## 2023-03-29 DIAGNOSIS — M25562 Pain in left knee: Secondary | ICD-10-CM

## 2023-03-29 NOTE — Progress Notes (Signed)
Office Visit Note   Patient: Dustin Rice           Date of Birth: 1956/03/31           MRN: [id number] Visit Date: 03/29/2023              Requested by: No referring provider defined for this encounter. PCP: Merlene Laughter, MD (Inactive)   Assessment & Plan: Visit Diagnoses:  1. Acute pain of left knee     Plan: Dustin Rice is a 67 year old gentleman with medial sided left knee pain.  Could be degenerative medial meniscal tear versus OA flare versus pes bursitis.  Based on treatment options he would like to try.  I have activity modification and home exercises that we will provide.  He will continue to take anti-inflammatories.  If his symptoms do not improve he will return to try cortisone injection.  Follow-Up Instructions: No follow-ups on file.   Orders:  Orders Placed This Encounter  Procedures   XR KNEE 3 VIEW LEFT   No orders of the defined types were placed in this encounter.     Procedures: No procedures performed   Clinical Data: No additional findings.   Subjective: Chief Complaint  Patient presents with   Left Knee - Pain    HPI Dustin Rice is a 67 year old gentleman here for evaluation of medial sided left knee pain for several weeks.  Denies any injuries.  Denies constant pain.  Increased pain with when he wakes up with start up stiffness and pain.  Pain is worse with activity. Review of Systems  Constitutional: Negative.   HENT: Negative.    Eyes: Negative.   Respiratory: Negative.    Cardiovascular: Negative.   Gastrointestinal: Negative.   Endocrine: Negative.   Genitourinary: Negative.   Skin: Negative.   Allergic/Immunologic: Negative.   Neurological: Negative.   Hematological: Negative.   Psychiatric/Behavioral: Negative.    All other systems reviewed and are negative.    Objective: Vital Signs: There were no vitals taken for this visit.  Physical Exam Vitals and nursing note reviewed.  Constitutional:      Appearance: He is  well-developed.  Pulmonary:     Effort: Pulmonary effort is normal.  Abdominal:     Palpations: Abdomen is soft.  Skin:    General: Skin is warm.  Neurological:     Mental Status: He is alert and oriented to person, place, and time.  Psychiatric:        Behavior: Behavior normal.        Thought Content: Thought content normal.        Judgment: Judgment normal.     Ortho Exam Examination of the left knee shows no joint effusion.  Slight tenderness along the pes bursa and medial joint line.  Collaterals and cruciates are stable.  Normal range of motion. Specialty Comments:  No specialty comments available.  Imaging: XR KNEE 3 VIEW LEFT  Result Date: 03/29/2023 No acute or structural abnormalities.  Mild osteoarthritis.    PMFS History: Patient Active Problem List   Diagnosis Date Noted   Palpitations 05/12/2016   Past Medical History:  Diagnosis Date   Allergic rhinitis    Cardiovascular risk factor 08/2013   10 YEARS   Cervical spine disease    Dyspnea    Esophageal reflux    GERD (gastroesophageal reflux disease)    Gilbert's syndrome    Hypercholesterolemia    Hyperlipidemia    Inguinal hernia    RIGHT   Palpitations  Prostatitis    RBBB    Shingles 03/2014    Family History  Problem Relation Age of Onset   CAD Father    Heart failure Father    Hypertension Brother     Past Surgical History:  Procedure Laterality Date   HERNIA REPAIR  2004   Social History   Occupational History   Not on file  Tobacco Use   Smoking status: Never   Smokeless tobacco: Never  Substance and Sexual Activity   Alcohol use: No   Drug use: No   Sexual activity: Not on file

## 2023-05-13 ENCOUNTER — Encounter: Payer: Self-pay | Admitting: Podiatry

## 2023-05-13 ENCOUNTER — Ambulatory Visit (INDEPENDENT_AMBULATORY_CARE_PROVIDER_SITE_OTHER): Payer: BC Managed Care – PPO | Admitting: Podiatry

## 2023-05-13 DIAGNOSIS — L539 Erythematous condition, unspecified: Secondary | ICD-10-CM

## 2023-05-13 DIAGNOSIS — M4316 Spondylolisthesis, lumbar region: Secondary | ICD-10-CM | POA: Insufficient documentation

## 2023-05-13 DIAGNOSIS — M48061 Spinal stenosis, lumbar region without neurogenic claudication: Secondary | ICD-10-CM | POA: Insufficient documentation

## 2023-05-13 DIAGNOSIS — L603 Nail dystrophy: Secondary | ICD-10-CM

## 2023-05-13 MED ORDER — DOXYCYCLINE HYCLATE 100 MG PO TABS: 100.0000 mg | ORAL_TABLET | Freq: Two times a day (BID) | ORAL | 0 refills | Status: AC

## 2023-05-13 NOTE — Progress Notes (Signed)
Subjective:  Patient ID: Dustin Rice, male    DOB: 03-23-1956,  MRN: [id number]  Chief Complaint  Patient presents with   Nail Problem    Right foot 3rd nail discolored     67 y.o. male presents with the above complaint.  Patient presents with right third digit nail contusion with erythema/infection.  Patient states it became discolored he did a lot of walking for [place] festival.  Hurts with ambulation worse with pressure pain scale 7 out of 10 dull aching nature he has not seen anyone as prior to seeing me.  He is not taking any antibiotics.   Review of Systems: Negative except as noted in the HPI. Denies N/V/F/Ch.  Past Medical History:  Diagnosis Date   Allergic rhinitis    Cardiovascular risk factor 08/2013   10 YEARS   Cervical spine disease    Dyspnea    Esophageal reflux    GERD (gastroesophageal reflux disease)    Gilbert's syndrome    Hypercholesterolemia    Hyperlipidemia    Inguinal hernia    RIGHT   Palpitations    Prostatitis    RBBB    Shingles 03/2014    Current Outpatient Medications:    alfuzosin (UROXATRAL) 10 MG 24 hr tablet, Take 10 mg by mouth once., Disp: , Rfl:    betamethasone dipropionate 0.05 % cream, Apply as directed to skin twice a day To areas of rash, Disp: , Rfl:    doxycycline (VIBRA-TABS) 100 MG tablet, Take 1 tablet (100 mg total) by mouth 2 (two) times daily for 10 days., Disp: 20 tablet, Rfl: 0   hydrocortisone (ANUSOL-HC) 25 MG suppository, Place 25 mg rectally daily., Disp: , Rfl:    nirmatrelvir/ritonavir (PAXLOVID, 300/100,) 20 x 150 MG & 10 x 100MG  TBPK, 3 tablets Orally Twice a day for 5 days, Disp: , Rfl:    atorvastatin (LIPITOR) 80 MG tablet, Take 80 mg by mouth daily., Disp: , Rfl:    cetirizine (ZYRTEC) 10 MG tablet, Take 10 mg by mouth daily., Disp: , Rfl:    Cholecalciferol (VITAMIN D-1000 MAX ST) 25 MCG (1000 UT) tablet, , Disp: , Rfl:    Efinaconazole 10 % SOLN, Apply 1 drop topically daily. (Patient not taking:  Reported on 07/21/2022), Disp: 4 mL, Rfl: 2   ezetimibe (ZETIA) 10 MG tablet, Take 10 mg by mouth daily., Disp: , Rfl:    FLUTICASONE PROPIONATE EX, Place 50 mcg into both nostrils., Disp: , Rfl:    ibuprofen (ADVIL,MOTRIN) 200 MG tablet, Take 200 mg by mouth every 6 (six) hours as needed for mild pain. , Disp: , Rfl:    Melatonin 5 MG TABS, Take 5 mg by mouth at bedtime as needed (sleep). , Disp: , Rfl:    meloxicam (MOBIC) 7.5 MG tablet, TAKE 2 TABLETS BY MOUTH DAILY. TAKE UP TO 2 WEEKS. START AFTER COMPLETING PREDNISONE, Disp: 30 tablet, Rfl: 0   meloxicam (MOBIC) 7.5 MG tablet, Take 1 tablet (7.5 mg total) by mouth daily as needed for up to 14 doses for pain., Disp: 60 tablet, Rfl: 2   omeprazole (PRILOSEC) 20 MG capsule, , Disp: , Rfl:    tadalafil (CIALIS) 5 MG tablet, Take 5 mg by mouth daily., Disp: , Rfl:    traMADol (ULTRAM) 50 MG tablet, Take 50 mg by mouth every 6 (six) hours as needed., Disp: , Rfl:   Social History   Tobacco Use  Smoking Status Never  Smokeless Tobacco Never    Allergies  Allergen Reactions   Erythromycin Other (See Comments)    GI UPSET   Objective:  There were no vitals filed for this visit. There is no height or weight on file to calculate BMI. Constitutional Well developed. Well nourished.  Vascular Dorsalis pedis pulses palpable bilaterally. Posterior tibial pulses palpable bilaterally. Capillary refill normal to all digits.  No cyanosis or clubbing noted. Pedal hair growth normal.  Neurologic Normal speech. Oriented to person, place, and time. Epicritic sensation to light touch grossly present bilaterally.  Dermatologic Pain on palpation of the entire/total nail on 3rd digit of the right erythema noted circumferential around the third digit No other open wounds. No skin lesions.  Orthopedic: Normal joint ROM without pain or crepitus bilaterally. No visible deformities. No bony tenderness.   Radiographs: None Assessment:   1. Nail  dystrophy   2. Erythema    Plan:  Patient was evaluated and treated and all questions answered.  Nail contusion/dystrophy third, right with underlying erythema -Patient elects to proceed with minor surgery to remove entire toenail today. Consent reviewed and signed by patient. -Entire/total nail excised. See procedure note. -Educated on post-procedure care including soaking. Written instructions provided and reviewed. -Patient to follow up in 2 weeks for nail check. -Doxycycline was dispensed for skin and soft tissue prophylaxis  Procedure: Excision of entire/total nail  Location: Right 3rd toe digit Anesthesia: Lidocaine 1% plain; 1.5 mL and Marcaine 0.5% plain; 1.5 mL, digital block. Skin Prep: Betadine. Dressing: Silvadene; telfa; dry, sterile, compression dressing. Technique: Following skin prep, the toe was exsanguinated and a tourniquet was secured at the base of the toe. The affected nail border was freed and excised. The tourniquet was then removed and sterile dressing applied. Disposition: Patient tolerated procedure well. Patient to return in 2 weeks for follow-up.   No follow-ups on file.

## 2023-05-30 ENCOUNTER — Other Ambulatory Visit (HOSPITAL_COMMUNITY): Payer: Self-pay | Admitting: Internal Medicine

## 2023-05-30 DIAGNOSIS — E7800 Pure hypercholesterolemia, unspecified: Secondary | ICD-10-CM

## 2023-06-01 DIAGNOSIS — E7800 Pure hypercholesterolemia, unspecified: Secondary | ICD-10-CM | POA: Diagnosis not present

## 2023-06-01 DIAGNOSIS — Z125 Encounter for screening for malignant neoplasm of prostate: Secondary | ICD-10-CM | POA: Diagnosis not present

## 2023-06-01 DIAGNOSIS — Z5181 Encounter for therapeutic drug level monitoring: Secondary | ICD-10-CM | POA: Diagnosis not present

## 2023-07-05 ENCOUNTER — Ambulatory Visit (HOSPITAL_BASED_OUTPATIENT_CLINIC_OR_DEPARTMENT_OTHER)
Admission: RE | Admit: 2023-07-05 | Discharge: 2023-07-05 | Disposition: A | Discharge status: Home / Self Care | Payer: BC Managed Care – PPO | Source: Ambulatory Visit | Attending: Internal Medicine | Admitting: Internal Medicine

## 2023-07-05 DIAGNOSIS — E7800 Pure hypercholesterolemia, unspecified: Secondary | ICD-10-CM | POA: Insufficient documentation

## 2023-07-14 DIAGNOSIS — I4510 Unspecified right bundle-branch block: Secondary | ICD-10-CM | POA: Insufficient documentation

## 2023-07-14 DIAGNOSIS — R931 Abnormal findings on diagnostic imaging of heart and coronary circulation: Secondary | ICD-10-CM | POA: Insufficient documentation

## 2023-07-14 DIAGNOSIS — E785 Hyperlipidemia, unspecified: Secondary | ICD-10-CM | POA: Insufficient documentation

## 2023-07-14 NOTE — Progress Notes (Signed)
Cardiology Office Note   Date:  07/15/2023   ID:  Dustin, Rice 1956-07-31, MRN [id number]  PCP:  Merlene Laughter, MD (Inactive)  Cardiologist:   Rollene Rotunda, MD Referring:  Emilio Aspen, * Chief Complaint  Patient presents with   Shortness of Breath      History of Present Illness: Dustin Rice is a 67 y.o. male who was seen by Dr. Katrinka Blazing for evaluation of chest pain in 2017.   He has had no prior cardiac diagnosis.   The coronary calcium score was 1096 with aortic atheroscerosis.  This was 93rd percentile.   He was referred by Emilio Aspen, *.  He had asked history of family history.  He has been noticing some shortness of breath with activities.  He does do some physical therapy at [facility] well because of some back problems.  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  He has noticed some dyspnea with exertion slowly progressively.  He is not having any resting shortness of breath, PND or orthopnea.  He is not describing chest pressure, neck or arm discomfort.  He has had no weight gain or edema.    I do note that he has had a past history of right bundle branch block.  He did have a Myoview years ago that was negative for any evidence of ischemia.   Past Medical History:  Diagnosis Date   Allergic rhinitis    Cervical spine disease    Esophageal reflux    GERD (gastroesophageal reflux disease)    Gilbert's syndrome    Hypercholesterolemia    Hyperlipidemia    Inguinal hernia    RIGHT   Prostatitis    RBBB    Shingles 03/2014    Past Surgical History:  Procedure Laterality Date   HERNIA REPAIR  2004     Current Outpatient Medications  Medication Sig Dispense Refill   alfuzosin (UROXATRAL) 10 MG 24 hr tablet Take 10 mg by mouth once.     cetirizine (ZYRTEC) 10 MG tablet Take 10 mg by mouth daily.     Cholecalciferol (VITAMIN D-1000 MAX ST) 25 MCG (1000 UT) tablet      ezetimibe (ZETIA) 10 MG tablet Take 10 mg by  mouth daily.     FLUTICASONE PROPIONATE EX Place 50 mcg into both nostrils.     hydrocortisone (ANUSOL-HC) 25 MG suppository Place 25 mg rectally daily.     Melatonin 5 MG TABS Take 5 mg by mouth at bedtime as needed (sleep).      meloxicam (MOBIC) 7.5 MG tablet TAKE 2 TABLETS BY MOUTH DAILY. TAKE UP TO 2 WEEKS. START AFTER COMPLETING PREDNISONE 30 tablet 0   meloxicam (MOBIC) 7.5 MG tablet Take 1 tablet (7.5 mg total) by mouth daily as needed for up to 14 doses for pain. 60 tablet 2   metoprolol tartrate (LOPRESSOR) 100 MG tablet Take 1 tablet (100 mg total) by mouth once for 1 dose. 1 tablet 0   omeprazole (PRILOSEC) 20 MG capsule      rosuvastatin (CRESTOR) 40 MG tablet Take 1 tablet (40 mg total) by mouth daily. 90 tablet 3   tadalafil (CIALIS) 5 MG tablet Take 5 mg by mouth daily.     traMADol (ULTRAM) 50 MG tablet Take 50 mg by mouth every 6 (six) hours as needed.     betamethasone dipropionate 0.05 % cream Apply as directed to skin twice a day To  areas of rash (Patient not taking: Reported on 07/15/2023)     Efinaconazole 10 % SOLN Apply 1 drop topically daily. (Patient not taking: Reported on 07/15/2023) 4 mL 2   ibuprofen (ADVIL,MOTRIN) 200 MG tablet Take 200 mg by mouth every 6 (six) hours as needed for mild pain.  (Patient not taking: Reported on 07/15/2023)     nirmatrelvir/ritonavir (PAXLOVID, 300/100,) 20 x 150 MG & 10 x 100MG  TBPK 3 tablets Orally Twice a day for 5 days (Patient not taking: Reported on 07/15/2023)     No current facility-administered medications for this visit.    Allergies:   Erythromycin    Social History:  The patient  reports that he has never smoked. He has never used smokeless tobacco. He reports that he does not drink alcohol and does not use drugs.   Family History:  The patient's family history includes CAD (age of onset: 57) in his father; Heart failure in his father; Hypertension in his brother.    ROS:  Please see the history of present illness.    Otherwise, review of systems are positive for none.   All other systems are reviewed and negative.    PHYSICAL EXAM: VS:  BP 112/74 (BP Location: Left Arm, Patient Position: Sitting, Cuff Size: Normal)   Pulse 88   Ht 5\' 7"  (1.702 m)   Wt 190 lb 3.2 oz (86.3 kg)   SpO2 96%   BMI 29.79 kg/m  , BMI Body mass index is 29.79 kg/m. GENERAL:  Well appearing HEENT:  Pupils equal round and reactive, fundi not visualized, oral mucosa unremarkable NECK:  No jugular venous distention, waveform within normal limits, carotid upstroke brisk and symmetric, no bruits, no thyromegaly LYMPHATICS:  No cervical, inguinal adenopathy LUNGS:  Clear to auscultation bilaterally BACK:  No CVA tenderness CHEST:  Unremarkable HEART:  PMI not displaced or sustained,S1 and S2 within normal limits, no S3, no S4, no clicks, no rubs, no murmurs ABD:  Flat, positive bowel sounds normal in frequency in pitch, no bruits, no rebound, no guarding, no midline pulsatile mass, no hepatomegaly, no splenomegaly EXT:  2 plus pulses throughout, no edema, no cyanosis no clubbing SKIN:  No rashes no nodules NEURO:  Cranial nerves II through XII grossly intact, motor grossly intact throughout PSYCH:  Cognitively intact, oriented to person place and time    EKG: Sinus rhythm, rate 88, right bundle branch block, no acute ST-T wave changes, no change from previous.    Recent Labs: No results found for requested labs within last 365 days.    Lipid Panel No results found for: "CHOL", "TRIG", "HDL", "CHOLHDL", "VLDL", "LDLCALC", "LDLDIRECT"    Wt Readings from Last 3 Encounters:  07/15/23 190 lb 3.2 oz (86.3 kg)  05/13/16 189 lb 12.8 oz (86.1 kg)      Other studies Reviewed: Additional studies/ records that were reviewed today include: Previous cardiology records, labs. Review of the above records demonstrates:  Please see elsewhere in the note.     ASSESSMENT AND PLAN:  Elevated coronary calcium: He does have some  shortness of breath.  I need to exclude obstructive coronary disease.  He would not be able to walk on a treadmill.  I will order a coronary CTA.  RBBB: This is chronic.  No change in therapy.  Dyslipidemia: LDL is 90.  I like to switch him from Lipitor to Crestor 40 mg daily.  In 3 months I will check a lipid profile and LP(a).   Current  medicines are reviewed at length with the patient today.  The patient does not have concerns regarding medicines.  The following changes have been made: As above  Labs/ tests ordered today include:   Orders Placed This Encounter  Procedures   CT CORONARY MORPH W/CTA COR W/SCORE W/CA W/CM &/OR WO/CM   Lipid panel   Lipoprotein A (LPA)   EKG 12-Lead     Disposition:   FU with with me in 1 year or sooner if needed based on results of the above.   Signed, Rollene Rotunda, MD  07/15/2023 5:08 PM    [facility]

## 2023-07-15 ENCOUNTER — Encounter: Payer: Self-pay | Admitting: Cardiology

## 2023-07-15 ENCOUNTER — Ambulatory Visit: Payer: Medicare Other | Attending: Cardiology | Admitting: Cardiology

## 2023-07-15 VITALS — BP 112/74 | HR 88 | Ht 67.0 in | Wt 190.2 lb

## 2023-07-15 DIAGNOSIS — R931 Abnormal findings on diagnostic imaging of heart and coronary circulation: Secondary | ICD-10-CM | POA: Diagnosis present

## 2023-07-15 DIAGNOSIS — I4510 Unspecified right bundle-branch block: Secondary | ICD-10-CM | POA: Diagnosis not present

## 2023-07-15 DIAGNOSIS — E785 Hyperlipidemia, unspecified: Secondary | ICD-10-CM | POA: Diagnosis not present

## 2023-07-15 DIAGNOSIS — Z136 Encounter for screening for cardiovascular disorders: Secondary | ICD-10-CM | POA: Diagnosis not present

## 2023-07-15 DIAGNOSIS — R9430 Abnormal result of cardiovascular function study, unspecified: Secondary | ICD-10-CM | POA: Diagnosis present

## 2023-07-15 MED ORDER — METOPROLOL TARTRATE 100 MG PO TABS: 100.0000 mg | ORAL_TABLET | Freq: Once | ORAL | 0 refills | Status: DC

## 2023-07-15 MED ORDER — ROSUVASTATIN CALCIUM 40 MG PO TABS: 40.0000 mg | ORAL_TABLET | Freq: Every day | ORAL | 3 refills | Status: DC

## 2023-07-15 NOTE — Patient Instructions (Signed)
Medication Instructions:  Stop Lipitor and start taking Crestor 40 mg daily. New script sent. *If you need a refill on your cardiac medications before your next appointment, please call your pharmacy*   Lab Work: Lipid Lpa (fasting) in 3 months.  If you have labs (blood work) drawn today and your tests are completely normal, you will receive your results only by: MyChart Message (if you have MyChart) OR A paper copy in the mail If you have any lab test that is abnormal or we need to change your treatment, we will call you to review the results.   Testing/Procedures:   Your cardiac CT will be scheduled at one of the below locations:   [facility] [address] [place], [place] [address] [phone]  If scheduled at [facility], please arrive at the [facility] and Children's Entrance (Entrance C2) of [facility] 30 minutes prior to test start time. You can use the FREE valet parking offered at entrance C (encouraged to control the heart rate for the test)  Proceed to the [facility] Radiology Department (first floor) to check-in and test prep.  All radiology patients and guests should use entrance C2 at [facility], accessed from [facility], even though the hospital's physical address listed is [address]     Please follow these instructions carefully (unless otherwise directed):  An IV will be required for this test and Nitroglycerin will be given.  Hold all erectile dysfunction medications at least 3 days (72 hrs) prior to test. (Ie viagra, cialis, sildenafil, tadalafil, etc)   On the Night Before the Test: Be sure to Drink plenty of water. Do not consume any caffeinated/decaffeinated beverages or chocolate 12 hours prior to your test. Do not take any antihistamines 12 hours prior to your test.  On the Day of the Test: Drink plenty of water until 1 hour prior to the test. Do not eat any food 1 hour prior to  test. You may take your regular medications prior to the test.  Take metoprolol (Lopressor) 100 mg two hours prior to test. After the Test: Drink plenty of water. After receiving IV contrast, you may experience a mild flushed feeling. This is normal. On occasion, you may experience a mild rash up to 24 hours after the test. This is not dangerous. If this occurs, you can take Benadryl 25 mg and increase your fluid intake. If you experience trouble breathing, this can be serious. If it is severe call 911 IMMEDIATELY. If it is mild, please call our office.  We will call to schedule your test 2-4 weeks out understanding that some insurance companies will need an authorization prior to the service being performed.   For more information and frequently asked questions, please visit our website : [email or web address]  For non-scheduling related questions, please contact the cardiac imaging nurse navigator should you have any questions/concerns: Cardiac Imaging Nurse Navigators Direct Office Dial: [phone]   For scheduling needs, including cancellations and rescheduling, please call [place], [phone].    Follow-Up: At [facility], you and your health needs are our priority.  As part of our continuing mission to provide you with exceptional heart care, we have created designated Provider Care Teams.  These Care Teams include your primary Cardiologist (physician) and Advanced Practice Providers (APPs -  Physician Assistants and Nurse Practitioners) who all work together to provide you with the care you need, when you need it.  We recommend signing up for  the patient portal called "MyChart".  Sign up information is provided on this After Visit Summary.  MyChart is used to connect with patients for Virtual Visits (Telemedicine).  Patients are able to view lab/test results, encounter notes, upcoming appointments, etc.  Non-urgent messages can be sent to your provider as  well.   To learn more about what you can do with MyChart, go to [email or web address].    Your next appointment:   12 month(s)  Provider:   Rollene Rotunda, MD

## 2023-07-18 ENCOUNTER — Ambulatory Visit: Payer: Medicare Other | Admitting: Cardiology

## 2023-07-29 ENCOUNTER — Telehealth (HOSPITAL_COMMUNITY): Payer: Self-pay | Admitting: *Deleted

## 2023-07-29 NOTE — Telephone Encounter (Signed)
Attempted to call patient regarding upcoming cardiac CT appointment. Left message on voicemail with name and callback number Hayley Sharpe RN Navigator Cardiac Imaging [facility] [phone] Office   

## 2023-08-01 ENCOUNTER — Ambulatory Visit (HOSPITAL_COMMUNITY)
Admission: RE | Admit: 2023-08-01 | Discharge: 2023-08-01 | Disposition: A | Discharge status: Home / Self Care | Payer: Medicare Other | Source: Ambulatory Visit | Attending: Cardiology | Admitting: Cardiology

## 2023-08-01 DIAGNOSIS — R931 Abnormal findings on diagnostic imaging of heart and coronary circulation: Secondary | ICD-10-CM

## 2023-08-01 DIAGNOSIS — R9430 Abnormal result of cardiovascular function study, unspecified: Secondary | ICD-10-CM | POA: Diagnosis present

## 2023-08-01 DIAGNOSIS — Z136 Encounter for screening for cardiovascular disorders: Secondary | ICD-10-CM | POA: Diagnosis present

## 2023-08-01 DIAGNOSIS — E785 Hyperlipidemia, unspecified: Secondary | ICD-10-CM

## 2023-08-01 MED ORDER — IOHEXOL 350 MG/ML SOLN
95.0000 mL | Freq: Once | INTRAVENOUS | Status: AC | PRN
  Administered 2023-08-01: 95 mL via INTRAVENOUS

## 2023-08-01 MED ORDER — NITROGLYCERIN 0.4 MG SL SUBL
SUBLINGUAL_TABLET | SUBLINGUAL | Status: AC
  Filled 2023-08-01: qty 2

## 2023-08-01 MED ORDER — NITROGLYCERIN 0.4 MG SL SUBL
0.8000 mg | SUBLINGUAL_TABLET | Freq: Once | SUBLINGUAL | Status: AC
  Administered 2023-08-01: 0.8 mg via SUBLINGUAL

## 2023-08-09 ENCOUNTER — Ambulatory Visit (INDEPENDENT_AMBULATORY_CARE_PROVIDER_SITE_OTHER): Payer: Medicare Other | Admitting: Psychiatry

## 2023-08-09 DIAGNOSIS — F411 Generalized anxiety disorder: Secondary | ICD-10-CM | POA: Diagnosis not present

## 2023-08-09 NOTE — Progress Notes (Signed)
 [facility] Counselor/Therapist Progress Note  Patient ID: Dustin Rice, MRN: [id number],    Date: 08/09/2023  Time Spent: 55 minutes   Treatment Type: Individual Therapy  Reported Symptoms: anxiety, some strengthening of marital relationship   Mental Status Exam:  Appearance:   Casual     Behavior:  Appropriate, Sharing, and Motivated  Motor:  Normal  Speech/Language:   Clear and Coherent  Affect:  anxious  Mood:  anxious  Thought process:  goal directed  Thought content:    WNL  Sensory/Perceptual disturbances:    WNL  Orientation:  oriented to person, place, time/date, situation, day of week, month of year, year, and stated date of Dec. 31, 2024  Attention:  Fair  Concentration:  Good and Fair  Memory:  WNL  Fund of knowledge:   Good  Insight:    Good  Judgment:   Good  Impulse Control:  Good   Risk Assessment: Danger to Self:  No Self-injurious Behavior: No Danger to Others: No Duty to Warn:no Physical Aggression / Violence:No  Access to Firearms a concern: No  Gang Involvement:No   Subjective: Patient today in session after being involved at another location and working on some marital issues.  He is coming now for individual therapy as his awareness is increased regarding some areas he needs to work further on in order to help himself individually and also in having a healthier marriage. Concerned with health issues also and needed to share more about this in session. Heart-related testing (calcium  count test, and follow up to occur with Dr. Lavona). Patient concerned and needed part of session today to focus on some concerns re: his son (living and working in [place], [place]). (Not all details included in this note due to patient privacy needs.) Working more on his relationship with son and best strategies for improved communication, which he found helpful. Continues to work further on marital relationship and communication, paying attention to how he says  what he says. Plans to continue working on marital strategies with wife, and wants to take better physical care of himself and continue to nurture marital relationship in healthy ways, while allowing himself to take breaks routinely. Continues working on healthier boundaries.   Interventions: Cognitive Behavioral Therapy and Ego-Supportive  Long term goal: Reduce overall level, frequency, and intensity of anxiety and depression so that daily functioning is not impaired. Short term goal: Increase understanding of beliefs and messages that produce worry and anxiety. Strategies: Identify and use specific coping strategies for anxiety and depression reduction.  Also explore alternative ways of communicating better with spouse and being more sensitive to needs within the family, including both wife and son.  Diagnosis:   ICD-10-CM   1. Generalized anxiety disorder  F41.1      Plan: Patient in for session today after being in marital therapy with another provider at a different location, which has helped patient and his wife's marriage.  Patient today stating he wants to focus more on some of his individual concerns that relate to his health and marriage/family.  Processed several of these today and establish some priorities for him.  Patient has also had the stressor of his elderly mother with some dementia and needing to coordinate her care.  He has shown some progress over time however is needing to get back a little more regular in sessions to work further on some previously set goals and move forward in a healthier and more hopeful direction.  Encouraged  patient in his practice of more positive and self affirming behaviors as noted in sessions including: Being committed to ongoing work with his treatment goal plan here and following through on specific steps as discussed in sessions, stay in touch with supportive people, taking breaks as needed, positive self talk and self-care, healthy nutrition  and exercise, stay in the present and focused on the things he can control, allow his faith to be an emotional support as well as spiritual, continue making his family a high priority including himself, and recognize the strength he shows working with goal-directed behaviors to move in a direction that supports his improved emotional health and outlook into the future.  Goal review and progress/challenges noted with patient.  Next appointment within 2 to 3 weeks.   Barnie Bunde, LCSW

## 2023-08-15 ENCOUNTER — Encounter: Payer: Self-pay | Admitting: Cardiology

## 2023-08-18 ENCOUNTER — Telehealth: Payer: Self-pay

## 2023-08-18 NOTE — Telephone Encounter (Signed)
 Called and spoke with patient regarding test results. We discussed his lab orders are on his chart for March 2025. [place] is going out of [place] time in June and will call back to get an appointment once his plans are finalized.

## 2023-08-18 NOTE — Telephone Encounter (Signed)
-----   Message from Lynwood Schilling sent at 08/12/2023 12:51 PM EST ----- He has non obstructive plaque.  I would like to see him back in about 3 months after his next lipid profile.  He needs good control of all of his risk factors but I am not planning further testing at this time.   Call Dustin Rice with the results

## 2023-08-24 ENCOUNTER — Ambulatory Visit: Payer: Medicare Other | Admitting: Psychiatry

## 2023-08-24 DIAGNOSIS — F411 Generalized anxiety disorder: Secondary | ICD-10-CM | POA: Diagnosis not present

## 2023-08-24 NOTE — Progress Notes (Signed)
 [facility] Counselor/Therapist Progress Note  Patient ID: Dustin Rice, MRN: [id number],    Date: 08/24/2023  Time Spent: 53 minutes  Treatment Type: Individual Therapy  Reported Symptoms: anxiety, marital stress   Mental Status Exam:  Appearance:   Casual and Neat     Behavior:  Appropriate, Sharing, and Motivated  Motor:  Normal  Speech/Language:   Clear and Coherent  Affect:  anxious  Mood:  anxious and depressed  Thought process:  goal directed  Thought content:    WNL  Sensory/Perceptual disturbances:    WNL  Orientation:  oriented to person, place, time/date, situation, day of week, month of year, year, and stated date of Jan. 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  Wanting to be healthy individually and in his marriage.  Continues to help oversee the care of his elderly mother who has dementia and needs supervised care in her home.  Shared and processes his support of his wife with Type 1 diabetesDiscussed some of his own health issues including heart related testing involving a calcium  count test and follows up with Dr. Lavonne Prairie.  He is concerned more with his aging and with his aging and potentially more health problems.  Discussed this in more detail today and is trying to take better care of himself by staying mobile, exercise some, and to eat healthier.  Patient also sharing and working on some issues regarding his adult son living in [place] .  (Not all details included in this note due to patient privacy needs).  Tries to be  supportive of son however sometimes feels a bit of a "pushback" due to certain behaviors.  He summed it up by saying that he feels like some of it is his son "growing up more now" and trying to be more assertive in his own self-care and decision making.  Patient is working both in session and outside of sessions on some communication skills we discussed last session which he is finding helpful and communicating with his son and also at times within his marriage relationship.  Patient and wife has been and marital counseling previously with another therapist and are doing better on a day-to-day basis and their communication and in their overall relationship.  Healthier boundaries is a work in progress for patient.  Interventions: Cognitive Behavioral Therapy and Ego-Supportive  Long term goal: Reduce overall level, frequency, and intensity of anxiety and depression so that daily functioning is not impaired. Short term goal: Increase understanding of beliefs and messages that produce worry and anxiety. Strategies: Identify and use specific coping strategies for anxiety and depression reduction.  Also explore alternative ways of communicating better with spouse and being more sensitive to needs within the family, including both wife and son.   Diagnosis:   ICD-10-CM   1. Generalized anxiety disorder  F41.1      Plan:  Patient today showing good effort and motivation as he worked on issues related to himself personally, and also marital/family areas which  he is trying to help strengthen.  Is definitely making progress and he senses this progress which has been motivation for him to continue working on specific areas of communication, understanding, and his own self-care as he is wanting to be as healthy as possible now at age 68 and into his future.  Did encourage patient in his practice of self affirming behaviors as noted and discussed in sessions including: Becoming more aware and committed to his  ongoing health status and wanting to be as healthy as possible, being committed to ongoing work with his treatment goal plan and following through on specific steps as discussed in sessions, stay in touch with supportive people, taking breaks as needed, positive self talk and self-care, healthy nutrition and exercise, stay in the present focused on the things he can control, allow his faith to be an emotional support as well as spiritual, continue making his family a high-priority including himself, and realize the strength he shows when working with goal-directed behaviors to move in a direction that supports his improved emotional health and his overall wellbeing.  Goal review and progress/challenges noted with patient.  Next appointment within 2 to 3 weeks.   Kelleen Patee, LCSW

## 2023-08-25 ENCOUNTER — Ambulatory Visit: Payer: Self-pay | Admitting: Psychiatry

## 2023-09-07 ENCOUNTER — Ambulatory Visit (INDEPENDENT_AMBULATORY_CARE_PROVIDER_SITE_OTHER): Payer: Self-pay | Admitting: Psychiatry

## 2023-09-07 DIAGNOSIS — F411 Generalized anxiety disorder: Secondary | ICD-10-CM

## 2023-09-07 NOTE — Progress Notes (Signed)
[facility] Counselor/Therapist Progress Note  Patient ID: Dustin Rice, MRN: [id number],    Date: 09/07/2023  Time Spent: 53 minutes  Treatment Type: Individual Therapy  Reported Symptoms: anxiety, marital stress   Mental Status Exam:  Appearance:   Casual     Behavior:  Appropriate, Sharing, and Motivated  Motor:  Normal  Speech/Language:   Clear and Coherent  Affect:  anxious  Mood:  anxious  Thought process:  normal  Thought content:    WNL  Sensory/Perceptual disturbances:    WNL  Orientation:  oriented to person, place, time/date, situation, day of week, month of year, year, and stated date of Jan. 29, 2025  Attention:  Good  Concentration:  Good  Memory:  WNL  Fund of knowledge:   Good  Insight:    Good  Judgment:   Good  Impulse Control:  Good   Risk Assessment: Danger to Self:  No Self-injurious Behavior: No Danger to Others: No Duty to Warn:no Physical Aggression / Violence:No  Access to Firearms a concern: No  Gang Involvement:No   Subjective:  Patient in session today reporting anxiety and stress as main symptoms mostly related to personal and family situations.  Trying to coordinate care for his aging mother with some dementia and in her 75's. Some difficulty in agreements with siblings in making decisions for their mother but met again recently and had a mtg that went well and they were able to get some resolution. Mom is doing "ok but forgets more and conversations tend to be shorter." Concerns re: health issues with wife, and patient's mother, and patient who recently has been on strict diet due to some health concerns.  Needed session today to talk through several personal and family related stressors, including some in reference to his aging mother who has some dementia as noted above.  Did well and looking at "what he can control and what he cannot control" and how to move forward in a more positive direction on certain things within the broader  family.  (Not all details included in this note due to patient privacy needs).  Patient does seem to feel that the stress has decreased a little as he continues to oversee the supervised care for his mother in her home.  Has been concerned about his wife with stressors related to her type 1 diabetes.  Encouraged more positive stress management.  A positive note for patient is that he has been reportedly eating healthier recently after having seen his doctor and feels good about some of the changes he has made.  Working to be more positive in his communication with his son who is out of college and living in [place].  Today we discussed a little more in terms of communication skills and ways of saying sensitive things that need to be said which seemed helpful to patient.  He reports that having healthier boundaries is definitely a work in progress but is feeling more optimistic.  Overall, learning healthier ways to manage his anxiety and needing to practice these more so they become a habit.  Interventions: Cognitive Behavioral Therapy and Ego-Supportive  Long term goal: Reduce overall level, frequency, and intensity of anxiety and depression so that daily functioning is not impaired. Short term goal: Increase understanding of beliefs and messages that produce worry and anxiety. Strategies: Identify and use specific coping strategies for anxiety and depression reduction.  Also explore alternative ways of communicating better with spouse and being more sensitive  to needs within the family, including both wife and son.   Diagnosis:   ICD-10-CM   1. Generalized anxiety disorder  F41.1      Plan:  Patient today in session actively participating as he worked further on issues regarding marital/family and himself personally in which she is trying to strengthen.  Continues to feel that he is making progress and I agree.  Wanting to be as healthy as possible into his senior adulthood "now that I have  reached 67". Encouraged patient in his practice of self affirming behaviors as noted and discussed in sessions including: Becoming more aware and committed to his ongoing health status and wanting to be as healthy overall as possible, being committed to ongoing work with his treatment goal plan and following through on specific steps as discussed in sessions, stay in touch with supportive people, taking breaks as needed, positive self talk and self-care, healthy nutrition and exercise, stay in the present focused on the things he can control, allow his faith to be an emotional support as well as spiritual, continue making his family a high priority including himself, and recognize the strength he shows working with goal-directed behaviors to move in a direction that supports his improved emotional health and his outlook into the future.  Patient is making progress and needs to continue his emphasis on goal-directed behaviors to keep moving in a more healthy and hopeful direction.  Goal review and progress/challenges noted with patient.  Next appointment within 2 to 3 weeks.   Mathis Fare, LCSW

## 2023-09-14 ENCOUNTER — Encounter: Payer: Self-pay | Admitting: Cardiology

## 2023-09-20 ENCOUNTER — Ambulatory Visit: Payer: Medicare Other | Admitting: Psychiatry

## 2023-09-20 DIAGNOSIS — F411 Generalized anxiety disorder: Secondary | ICD-10-CM

## 2023-09-20 NOTE — Progress Notes (Signed)
[facility] Counselor/Therapist Progress Note  Patient ID: Dustin Rice, MRN: [id number],    Date: 09/20/2023  Time Spent: 48 minutes   Treatment Type: Individual Therapy  Reported Symptoms: anxiety, stressed    Mental Status Exam:  Appearance:   Casual     Behavior:  Appropriate, Sharing, and Motivated  Motor:  Normal  Speech/Language:   Clear and Coherent  Affect:  anxious  Mood:  anxious  Thought process:  goal directed  Thought content:    WNL  Sensory/Perceptual disturbances:    WNL  Orientation:  oriented to person, place, time/date, situation, day of week, month of year, year, and stated date of Feb. 11, 2025  Attention:  Fair  Concentration:  Good and Fair  Memory:  WNL  Fund of knowledge:   Good  Insight:    Good  Judgment:   Good  Impulse Control:  Good   Risk Assessment: Danger to Self:  No Self-injurious Behavior: No Danger to Others: No Duty to Warn:no Physical Aggression / Violence:No  Access to Firearms a concern: No  Gang Involvement:No   Subjective:  Patient today in session and reports symptoms to include anxiety and stress as both relate to current personal/family situations.Concerned for wife having physical issues and having some med reactions and this has added to patient's stress. Some stressors within family that patient needed to work on in session today involving family dynamics and being pulled in different directions. Needed by wife more due to her back issues and less mobility. Continues coordinating care for elderly mother with some dementia. Improvement in relationships with siblings. Processed more of his concerns for wife's current health issues including diabetes which she is more concerned about today.. Patient also sticking with a much healthy diet currently reports feeling better.  Encouraged patient and his improved stress management which he feels impacts him physically and emotionally and positive ways.  States that he is also  intentionally trying to be more positive in his communication with his adult son who lives in [place].  Working further on healthier boundaries within the family and beyond.  Continues learning and practicing healthier ways of trying to manage his anxiety as discussed in sessions.  Even with some current stressors, patient still seems to be more positive today.  Interventions: Cognitive Behavioral Therapy and Ego-Supportive  Long term goal: Reduce overall level, frequency, and intensity of anxiety and depression so that daily functioning is not impaired. Short term goal: Increase understanding of beliefs and messages that produce worry and anxiety. Strategies: Identify and use specific coping strategies for anxiety and depression reduction.  Also explore alternative ways of communicating better with spouse and being more sensitive to needs within the family, including both wife and son.   Diagnosis:   ICD-10-CM   1. Generalized anxiety disorder  F41.1      Plan: Patient in session today and participated well as he worked more on specific issues regarding himself personally and also marital/family issues.  More emphasis on wanting to be as physically and emotionally healthy as he can be especially as he has reached age 68 and hoping to keep living a healthy, full, and enjoyable life into the future. Reminded and encouraged patient in his practice of self affirming behaviors as noted and discussed in sessions including: Becoming more aware and committed to his ongoing health status and wanting to be as healthy overall as possible, being committed to ongoing work with his treatment goal plan and following  through on specific steps as discussed in sessions, stay in touch with supportive people, taking breaks as needed, positive self talk and self-care, healthy nutrition and exercise, stay in the present focused on the things he can control, allow his faith to be an emotional support as well as  spiritual, continue making his family a high priority including himself, and realize the strength he shows working with goal-directed behaviors to move in a direction that supports his improved emotional health and overall wellbeing.  This patient is making progress and needs to continue his emphasis and work on goal-directed behaviors to keep moving and a healthier and more hopeful direction.  Goal review and progress/challenges noted with patient.  Next appointment within 2 to 3 weeks.   Mathis Fare, LCSW

## 2023-10-14 LAB — LIPID PANEL
Chol/HDL Ratio: 3.5 ratio (ref 0.0–5.0)
Cholesterol, Total: 143 mg/dL (ref 100–199)
HDL: 41 mg/dL (ref >39)
LDL Chol Calc (NIH): 75 mg/dL (ref 0–99)
Triglycerides: 153 mg/dL — ABNORMAL HIGH (ref 0–149)
VLDL Cholesterol Cal: 27 mg/dL (ref 5–40)

## 2023-10-14 LAB — LIPOPROTEIN A (LPA): Lipoprotein (a): 66.5 nmol/L (ref <75.0)

## 2023-10-20 ENCOUNTER — Ambulatory Visit: Payer: Medicare Other | Admitting: Psychiatry

## 2023-10-20 DIAGNOSIS — F411 Generalized anxiety disorder: Secondary | ICD-10-CM | POA: Diagnosis not present

## 2023-10-20 NOTE — Progress Notes (Signed)
 [facility] Counselor/Therapist Progress Note  Patient ID: ZAYLYN BERGDOLL, MRN: [id number],    Date: 10/20/2023  Time Spent: 53 minutes   Treatment Type: Individual Therapy  Reported Symptoms: anxiety, stressed   Mental Status Exam:  Appearance:   Well Groomed     Behavior:  Appropriate, Sharing, and Motivated  Motor:  Normal  Speech/Language:   Clear and Coherent  Affect:  anxious  Mood:  anxious  Thought process:  goal directed  Thought content:    WNL  Sensory/Perceptual disturbances:    WNL  Orientation:  oriented to person, place, time/date, situation, day of week, month of year, year, and stated date of October 20, 2023  Attention:  Good  Concentration:  Good  Memory:  WNL  Fund of knowledge:   Good  Insight:    Good  Judgment:   Good  Impulse Control:  Good   Risk Assessment: Danger to Self:  No Self-injurious Behavior: No Danger to Others: No Duty to Warn:no Physical Aggression / Violence:No  Access to Firearms a concern: No  Gang Involvement:No   Subjective:    Patient working in session today and reporting symptoms of anxiety, stress, relating to personal/family situations.  Needed to focus most of his time on his anxiety relating to personal/family challenges.  Did well and confronting some sensitive issues and also noting how he has handled some things in the past and certain changes that might be good for him to consider now.  Continues to work towards reducing his overall level, frequency, and intensity of his anxiety so that daily functioning and relationships are not impaired.  Also working to refrain from excessive worrying and has definitely made progress.  He and his wife both are working on their communication skills and patient reports today that they are both noticing the improvement even though it is a work in progress.  He is motivated and very caring about all of his family even when there is challenges within relationships.  Has started "acting  classes" and enjoying them so far; doing it more as a hobby. Stressed  with some family situations.  Patient continues to oversee his elderly mom's healthcare; mom has some dementia. Wife fell and having more back/hip/sciatica issues and patient trying to help her more at this point. Hoping to be able to keep their travel plans in April/May. Very concerned about his wife and processed some of those concerns in session today. He reports feeling better more recently. Reports staying on healthier diet and continues to feel better physically.   Interventions: Cognitive Behavioral Therapy and Ego-Supportive  Long term goal: Reduce overall level, frequency, and intensity of anxiety and depression so that daily functioning is not impaired. Short term goal: Increase understanding of beliefs and messages that produce worry and anxiety. Strategies: Identify and use specific coping strategies for anxiety and depression reduction.  Also explore alternative ways of communicating better with spouse and being more sensitive to needs within the family, including both wife and son.  Diagnosis:   ICD-10-CM   1. Generalized anxiety disorder  F41.1      Plan:  Patient today motivated and participating well as he worked further on personal/marital/family issues and some anxiety, stress, and depression.  Depression decreased some.  Continues to want to pay more attention to his physical and emotional health as he has now reached age 68 and is more conscientious about his health.  States that he wants to keep living a healthy and full  life and be able to enjoy the future ahead.  Encouraged patient and his practice of self affirming behaviors as noted and discussed in sessions including: Being committed to ongoing work with his treatment goal plan and following through specific steps as discussed in sessions, becoming more aware and committed to his ongoing health status and wanting to be as healthy as possible, stay in  touch with supportive people, taking breaks as needed, positive self talk and self-care, healthy nutrition and exercise, staying the present focused on the things he can control, allow his faith to be an emotional support as well as spiritual, continue making his family as high-priority including himself, and recognize the strength he shows when working with goal-directed behaviors to move in a direction that supports his improved emotional health and his outlook into the future.  Patient has definitely made progress and he needs to continue his work with goal-directed behaviors to keep moving in a healthier and more hopeful direction for himself.   Goal review and progress/challenges noted with patient.  Next appointment within 3 weeks.   Mathis Fare, LCSW

## 2023-11-03 ENCOUNTER — Ambulatory Visit: Payer: Medicare Other | Admitting: Psychiatry

## 2023-11-03 DIAGNOSIS — F411 Generalized anxiety disorder: Secondary | ICD-10-CM

## 2023-11-03 NOTE — Progress Notes (Signed)
 [facility] Counselor/Therapist Progress Note  Patient ID: Dustin Rice, MRN: [id number],    Date: 11/03/2023  Time Spent: 55 minutes   Treatment Type: Individual Therapy  Reported Symptoms: anxiety, feeling stressed    Mental Status Exam:  Appearance:   Casual     Behavior:  Appropriate, Sharing, and Motivated  Motor:  Normal  Speech/Language:   Clear and Coherent  Affect:  anxious  Mood:  anxious  Thought process:  goal directed  Thought content:    WNL  Sensory/Perceptual disturbances:    WNL  Orientation:  oriented to person, place, time/date, situation, day of week, month of year, year, and stated date of November 03, 2023  Attention:  Fair  Concentration:  Good and Fair  Memory:  WNL  Fund of knowledge:   Good  Insight:    Good  Judgment:   Good  Impulse Control:  Good   Risk Assessment: Danger to Self:  No Self-injurious Behavior: No Danger to Others: No Duty to Warn:no Physical Aggression / Violence:No  Access to Firearms a concern: No  Gang Involvement:No   Subjective: Patient today in session and reporting symptoms of anxiety, stress, some depression, and situational issues relating to family/personal areas of his life.  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 Working to be more connected emotionally with wife, improved listening, and worked with several examples in session today. Added "I've got to remember to do this when I'm with my wife." Still involved in an acting class which has been something very different for him, and is finding it to be very interesting and engaging, and in some ways has used helpful techniques within his marital and family relationships.Good motivation and follow through on goals.  Reviewed with patient some of his ongoing work  on excessive worrying and he is making progress in that area.  Patient and wife both notice some improvement in their relationship as a result of working on some communication skills and setting priorities.  Overall level of anxiety is reducing both in frequency and intensity.  He is stressed at times in session but not as stressed as he has been previously.  He is continuing a healthier diet and feels the benefits from this.  Is concerned that his wife may have to have back surgery sometime soon and she is to see the surgeon tomorrow.  Interventions: Cognitive Behavioral Therapy and Ego-Supportive  Long term goal: Reduce overall level, frequency, and intensity of anxiety and depression so that daily functioning is not impaired. Short term goal: Increase understanding of beliefs and messages that produce worry and anxiety. Strategies: Identify and use specific coping strategies for anxiety and depression reduction.  Also explore alternative ways of communicating better with spouse and being more sensitive to needs within the family, including both wife and son.  Diagnosis:   ICD-10-CM   1. Generalized anxiety disorder  F41.1      Plan:    Patient today showing good participation and motivation working further on his family, personal, and marital issues as well as focusing on decreasing his anxiety, stress, and depression.  Depression has decreased some already.  Has made a commitment to himself to focus more on his physical and emotional health after having turned age 68 which he reports has  made him more conscientious about his health in a positive way.  Reminded and encouraged patient in his practice of self affirming behaviors as noted and discussed in sessions including: Being committed to ongoing work with his treatment goal plan and following through specific steps as discussed in sessions, becoming more aware and committed to his ongoing health status and wanting to be as healthy as possible,  stay in touch with supportive people, take breaks as needed, positive self talk and self-care, healthy nutrition and exercise, staying in the present focused on the things he can control, allow his faith to be an emotional support as well as spiritual, continue making his family a high priority including himself, and recognize the strength he shows when working with goal-directed behaviors to move in a direction that supports his improved emotional health and his outlook into the future.  Finas Delone has definitely made progress and needs to continue working with goal-directed behaviors to keep moving in a healthier and more hopeful direction.  Goal review and progress/challenges noted with patient.  Next appointment within 3 weeks.   Mathis Fare, LCSW

## 2023-11-13 NOTE — Progress Notes (Unsigned)
  Cardiology Office Note:   Date:  11/14/2023  ID:  Dustin, Rice 06-29-1956, MRN [id number] PCP: Merlene Laughter, MD (Inactive)  [facility] Providers Cardiologist:  Rollene Rotunda, MD {  History of Present Illness:   Dustin Rice is a 68 y.o. male who was seen by Dr. Katrinka Blazing for evaluation of chest pain in 2017.   He has had no prior cardiac diagnosis.   The coronary calcium score was 1096 with aortic atheroscerosis.  This was 93rd percentile.  Coronary CTA demonstrated 30 - 49% in the RCA.  LAD had mid 30 - 49%.  Circ had mid 30 -49% stenosis.      He is trying to do some exercising.  He does have a lot of stress at work.  He has not had any new symptoms.  Occasionally gets some chest discomfort that is unchanged from previous.  He works as a Best boy.  He does have fatigue and is getting CPAP testing.  I did switch him to Crestor at the last visit but he is not yet at target despite max Crestor and Zetia.  He denies any palpitations, presyncope or syncope.  He has had no PND or orthopnea.  He did change his diet.  ROS: As stated in the HPI and negative for all other systems.  Studies Reviewed:    EKG:     NA   Risk Assessment/Calculations:              Physical Exam:   VS:  BP 116/72 (BP Location: Left Arm, Patient Position: Sitting, Cuff Size: Normal)   Pulse 76   Ht 5\' 7"  (1.702 m)   Wt 183 lb (83 kg)   SpO2 97%   BMI 28.66 kg/m    Wt Readings from Last 3 Encounters:  11/14/23 183 lb (83 kg)  07/15/23 190 lb 3.2 oz (86.3 kg)  05/13/16 189 lb 12.8 oz (86.1 kg)     GEN: Well nourished, well developed in no acute distress NECK: No JVD; No carotid bruits CARDIAC: RRR, no murmurs, rubs, gallops RESPIRATORY:  Clear to auscultation without rales, wheezing or rhonchi  ABDOMEN: Soft, non-tender, non-distended EXTREMITIES:  No edema; No deformity   ASSESSMENT AND PLAN:   Elevated coronary calcium:   He is having no new symptoms.  We had another long  discussion about primary risk reduction.  No further testing is suggested.  RBBB: This is chronic.    Dyslipidemia: LDL was 75 LPa is 66.5.  Goal is 55. He is intolerant of statin with muscle aches.  I start Repatha and continue Zetia.     Follow up with me in 12 months.   Signed, Rollene Rotunda, MD

## 2023-11-14 ENCOUNTER — Other Ambulatory Visit (HOSPITAL_COMMUNITY): Payer: Self-pay

## 2023-11-14 ENCOUNTER — Encounter: Payer: Self-pay | Admitting: Cardiology

## 2023-11-14 ENCOUNTER — Telehealth: Payer: Self-pay

## 2023-11-14 ENCOUNTER — Telehealth: Payer: Self-pay | Admitting: Pharmacy Technician

## 2023-11-14 ENCOUNTER — Ambulatory Visit: Payer: Medicare Other | Attending: Cardiology | Admitting: Cardiology

## 2023-11-14 VITALS — BP 116/72 | HR 76 | Ht 67.0 in | Wt 183.0 lb

## 2023-11-14 DIAGNOSIS — R931 Abnormal findings on diagnostic imaging of heart and coronary circulation: Secondary | ICD-10-CM | POA: Diagnosis present

## 2023-11-14 DIAGNOSIS — I4510 Unspecified right bundle-branch block: Secondary | ICD-10-CM | POA: Diagnosis present

## 2023-11-14 DIAGNOSIS — E785 Hyperlipidemia, unspecified: Secondary | ICD-10-CM | POA: Diagnosis present

## 2023-11-14 NOTE — Telephone Encounter (Signed)
 Pharmacy Patient Advocate Encounter   Received notification from Physician's Office that prior authorization for REPATHA is required/requested.   Insurance verification completed.   The patient is insured through  [facility] MEDIC[facility] D  .   Per test claim:  PRALUENT is preferred by the insurance.  If suggested medication is appropriate, Please send in a new RX and discontinue this one. If not, please advise as to why it's not appropriate so that we may request a Prior Authorization. Please note, some preferred medications may still require a PA.  If the suggested medications have not been trialed and there are no contraindications to their use, the PA will not be submitted, as it will not be approved.

## 2023-11-14 NOTE — Telephone Encounter (Signed)
 Message sent to MD to review for dosing of Praluent

## 2023-11-14 NOTE — Telephone Encounter (Signed)
-----   Message from Nurse Eileen Stanford E sent at 11/14/2023 11:03 AM EDT ----- Regarding: repatha PA needed Hello   This patient of Dr. Antoine Poche needs a PA for Repatha Sureclick  Unable to tolerate crestor d/t myalgias  Please CC me on the phone note so I can send in Rx when approved  Thanks

## 2023-11-14 NOTE — Patient Instructions (Signed)
 Medication Instructions:  STOP rosuvastatin  CONTINUE zetia for cholesterol  Dr. Antoine Poche recommends Repatha Sureclick 140mg /mL (PCSK9). This is an injectable cholesterol medication self-administered once every 14 days. This medication will likely need prior approval with your insurance company, which we will work on. If the medication is not approved initially, we may need to do an appeal with your insurance.   Administer medication in area of fatty tissue such as abdomen, outer thigh, back of upper arm - and rotate site with each injection Store medication in refrigerator until ready to administer - allow to sit at room temp for 30 mins - 1 hour prior to injection Dispose of medication in a SHARPS container - your pharmacy should be able to direct you on this and proper disposal   Patient Assistance:    These foundations have funds at various times.   The [employer] Foundation: https://www.panfoundation.org/disease-funds/hypercholesterolemia/ -- can sign up for wait list  The [facility] offers assistance to help pay for medication copays.  They will cover copays for all cholesterol lowering meds, including statins, fibrates, omega-3 fish oils like Vascepa, ezetimibe, Repatha, Praluent, Nexletol, Nexlizet.  The cards are usually good for $2,500 or 12 months, whichever comes first. Our fax # is [phone] (you will need this to apply) Go to healthwellfoundation.org Click on "Apply Now" Answer questions as to whom is applying (patient or representative) Your disease fund will be "hypercholesterolemia - Medicare access" They will ask questions about finances and which medications you are taking for cholesterol When you submit, the approval is usually within minutes.  You will need to print the card information from the site You will need to show this information to your pharmacy, they will bill your Medicare Part D plan first -then bill Health Well --for the copay.   You can also call  them at [phone], although the hold times can be quite long.     *If you need a refill on your cardiac medications before your next appointment, please call your pharmacy*  Lab Work: FASTING lab work in 3 months -- lipid panel  If you have labs (blood work) drawn today and your tests are completely normal, you will receive your results only by: MyChart Message (if you have MyChart) OR A paper copy in the mail If you have any lab test that is abnormal or we need to change your treatment, we will call you to review the results.   Follow-Up: At [facility], you and your health needs are our priority.  As part of our continuing mission to provide you with exceptional heart care, our providers are all part of one team.  This team includes your primary Cardiologist (physician) and Advanced Practice Providers or APPs (Physician Assistants and Nurse Practitioners) who all work together to provide you with the care you need, when you need it.  Your next appointment:   12 months with Dr. Antoine Poche  We recommend signing up for the patient portal called "MyChart".  Sign up information is provided on this After Visit Summary.  MyChart is used to connect with patients for Virtual Visits (Telemedicine).  Patients are able to view lab/test results, encounter notes, upcoming appointments, etc.  Non-urgent messages can be sent to your provider as well.   To learn more about what you can do with MyChart, go to [email or web address].        1st Floor: - Lobby - Registration  - Pharmacy  - Lab - Cafe  2nd Floor: - PV Lab -  Diagnostic Testing (echo, CT, nuclear med)  3rd Floor: - Vacant  4th Floor: - TCTS (cardiothoracic surgery) - AFib Clinic - Structural Heart Clinic - Vascular Surgery  - Vascular Ultrasound  5th Floor: - HeartCare Cardiology (general and EP) - Clinical Pharmacy for coumadin, hypertension, lipid, weight-loss medications, and med management  appointments    Valet parking services will be available as well.

## 2023-11-14 NOTE — Telephone Encounter (Signed)
 Pharmacy Patient Advocate Encounter   Received notification from Pt Calls Messages that prior authorization for PRALUENT is required/requested.   Insurance verification completed.   The patient is insured through [facility] .   Per test claim: PA required; PA submitted to above mentioned insurance via CoverMyMeds Key/confirmation #/EOC [id number] Status is pending

## 2023-11-15 ENCOUNTER — Other Ambulatory Visit (HOSPITAL_COMMUNITY): Payer: Self-pay

## 2023-11-15 NOTE — Telephone Encounter (Signed)
 Received notification from [facility] that Prior Authorization for Praluent has been APPROVED from 10/31/23 to until further notice. Ran test claim, Copay is $466.28- one month . This test claim was processed through [facility]- copay amounts may vary at other pharmacies due to pharmacy/plan contracts, or as the patient moves through the different stages of their insurance plan.    PA #/Case ID/Reference #: [id number]

## 2023-11-15 NOTE — Telephone Encounter (Signed)
 Pharmacy Patient Advocate Encounter  Received notification from [facility] that Prior Authorization for Praluent has been APPROVED from 10/31/23 to until further notice. Ran test claim, Copay is $466.28- one month . This test claim was processed through [facility]- copay amounts may vary at other pharmacies due to pharmacy/plan contracts, or as the patient moves through the different stages of their insurance plan.   PA #/Case ID/Reference #: [id number]

## 2023-11-16 ENCOUNTER — Other Ambulatory Visit (HOSPITAL_COMMUNITY): Payer: Self-pay

## 2023-11-16 NOTE — Telephone Encounter (Signed)
 To be noted he may qualify for a healthwell grant for the praluent.

## 2023-11-16 NOTE — Telephone Encounter (Signed)
 Update sent to patient via MyChart

## 2023-11-16 NOTE — Telephone Encounter (Signed)
      To be noted he may qualify for a healthwell grant for the praluent.

## 2023-11-22 MED ORDER — PRALUENT 75 MG/ML ~~LOC~~ SOAJ
75.0000 mg | SUBCUTANEOUS | 2 refills | Status: DC
  Filled 2023-11-28: qty 2, 28d supply, fill #0
  Filled 2024-01-02: qty 2, 28d supply, fill #1
  Filled 2024-01-17 (×2): qty 2, 28d supply, fill #2

## 2023-11-22 NOTE — Addendum Note (Signed)
 Addended by: Francesco Inks on: 11/22/2023 07:26 AM   Modules accepted: Orders

## 2023-11-22 NOTE — Telephone Encounter (Signed)
 Rx(s) sent to pharmacy electronically.

## 2023-11-24 ENCOUNTER — Ambulatory Visit: Payer: Medicare Other | Admitting: Psychiatry

## 2023-11-24 DIAGNOSIS — F411 Generalized anxiety disorder: Secondary | ICD-10-CM

## 2023-11-24 NOTE — Progress Notes (Signed)
 [facility] Counselor/Therapist Progress Note  Patient ID: Dustin Rice, MRN: [id number],    Date: 11/24/2023  Time Spent: 55 minutes   Treatment Type: Individual Therapy  Reported Symptoms: anxiety, stressed    Mental Status Exam:  Appearance:   Casual     Behavior:  Appropriate, Sharing, and Motivated  Motor:  Normal  Speech/Language:   Clear and Coherent  Affect:  Appropriate  Mood:  anxious  Thought process:  goal directed  Thought content:    WNL  Sensory/Perceptual disturbances:    WNL  Orientation:  oriented to person, place, time/date, situation, day of week, month of year, year, and stated date of November 24, 2023  Attention:  Good  Concentration:  Good  Memory:  WNL  Fund of knowledge:   Good  Insight:    Good  Judgment:   Good  Impulse Control:  Good   Risk Assessment: Danger to Self:  No Self-injurious Behavior: No Danger to Others: No Duty to Warn:no Physical Aggression / Violence:No  Access to Firearms a concern: No  Gang Involvement:No   Subjective:   Patient in session today and reports some continued stress, anxiety, some depression although improved, and personal/situational issues relating to family and his own personal health concerns.  He and his wife continue to work on improving their relationship and positive ways and also their communication within their marriage and family.  Patient becoming much more aware of his on communication within the marriage and has noticed ways that he needs to improve and is practicing those changes, but still working on making this "more of a habit". Trip to [place] for wedding is uncertain at this point due to wife's health with back issues.  Talking through more of his stress and anxiety about wife's health concerns. Some healing involved between brother and brother's wife, which is feeling really good to patient. Dr changed his cholesterol med and some benefit. 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  Does feel he and wife are doing better in their relationship and are continuing to work on it. Working on improved listening skills and paying attention to how he says what he says.  Interventions: Cognitive Behavioral Therapy and Ego-Supportive Long term goal: Reduce overall level, frequency, and intensity of anxiety and depression so that daily functioning is not impaired. Short term goal: Increase understanding of beliefs and messages that produce worry and anxiety. Strategies: Identify and use specific coping strategies for anxiety and depression reduction.  Also explore alternative ways of communicating better with spouse and being more sensitive to needs within the family, including both wife and son.  Diagnosis:   ICD-10-CM   1. Generalized anxiety disorder  F41.1      Plan:  Patient in session today actively participating as he worked further on his goals regarding family, personal, and marital issues as well as his symptoms of anxiety, stress, some depression although that has improved.  He continues to be committed to improving and maintaining his physical and emotional health after recently turning age 68 which he shares has made him more aware and conscientious about maintaining better health.  He also continues to be very supportive of his wife who has several health issues and patient actively encourages her as she is trying to be more health-conscious as well. Encouraged patient continue practicing more self affirming behaviors as discussed in sessions including: Being more committed to ongoing work with his treatment goal plan and following through with specific steps as  discussed in sessions, being more aware and committed to ongoing health status and wanting to be as healthy as possible, stay in touch with supportive people, take breaks as needed, positive self talk and self-care, healthy nutrition and exercise, staying in the present focused on the things he can control, allow his  faith to be an emotional support as well as spiritual, continue making his family a high priority including himself, and recognize the strength he shows working with goal-directed behaviors to move in a direction that supports his improved emotional health and his overall wellbeing.  Dwan Hemmelgarn is making progress and he needs to continue working with goal-directed behaviors to move in a healthier and more hopeful direction.  Goal review and progress/challenges noted with patient.  Next appt within 3 weeks.   Kelleen Patee, LCSW

## 2023-11-28 ENCOUNTER — Other Ambulatory Visit (HOSPITAL_BASED_OUTPATIENT_CLINIC_OR_DEPARTMENT_OTHER): Payer: Self-pay

## 2023-11-29 ENCOUNTER — Other Ambulatory Visit (HOSPITAL_BASED_OUTPATIENT_CLINIC_OR_DEPARTMENT_OTHER): Payer: Self-pay

## 2023-11-29 ENCOUNTER — Other Ambulatory Visit: Payer: Self-pay

## 2023-12-02 ENCOUNTER — Other Ambulatory Visit (HOSPITAL_BASED_OUTPATIENT_CLINIC_OR_DEPARTMENT_OTHER): Payer: Self-pay

## 2023-12-06 ENCOUNTER — Ambulatory Visit (INDEPENDENT_AMBULATORY_CARE_PROVIDER_SITE_OTHER): Admitting: Psychiatry

## 2023-12-06 DIAGNOSIS — F411 Generalized anxiety disorder: Secondary | ICD-10-CM

## 2023-12-06 NOTE — Progress Notes (Signed)
 [facility] Counselor/Therapist Progress Note  Patient ID: LEONTE RAVELING, MRN: [id number],    Date: 12/06/2023  Time Spent: 60 minutes   Treatment Type: Individual Therapy  Reported Symptoms: anxiety, stressed   Mental Status Exam:  Appearance:   Casual     Behavior:  Appropriate, Sharing, and Motivated  Motor:  Normal  Speech/Language:   Clear and Coherent  Affect:  anxious  Mood:  anxious  Thought process:  goal directed  Thought content:    WNL  Sensory/Perceptual disturbances:    WNL  Orientation:  oriented to person, place, time/date, situation, day of week, month of year, year, and stated date of December 06, 2023  Attention:  Good  Concentration:  Good  Memory:  WNL  Fund of knowledge:   Good  Insight:    Good  Judgment:   Good  Impulse Control:  Good   Risk Assessment: Danger to Self:  No Self-injurious Behavior: No Danger to Others: No Duty to Warn:no Physical Aggression / Violence:No  Access to Firearms a concern: No  Gang Involvement:No   Subjective:   Patient today working in session on his anxiety, stress, some depression that is improving, and personal/situational family issues related to his and family health concerns. Wife continues to have health issues related to back pain, and her situation is more complex due to her other health concerns. Patient concerned about wife and talking through his concerns in session today. Communication issues within family processed at length in session today. Looking at what helps his marital communication and what does not. [place] wedding trip up in the air due to wife's health status currently. Encouraged patient to continue working on communication skills at home with wife and he is motivated for this.    Interventions: Cognitive Behavioral Therapy and Ego-Supportive Long term goal: Reduce overall level, frequency, and intensity of anxiety and depression so that daily functioning is not impaired. Short term  goal: Increase understanding of beliefs and messages that produce worry and anxiety. Strategies: Identify and use specific coping strategies for anxiety and depression reduction.  Also explore alternative ways of communicating better with spouse and being more sensitive to needs within the family, including both wife and son.    Diagnosis:   ICD-10-CM   1. Generalized anxiety disorder  F41.1      Plan:  Patient today in session motivated and participating well as he continued his work on personal, family, and marital goals as well as his symptoms of some depression, anxiety, stress, and is noticing that his depression is decreasing. Very committed to improving his emotional and physical health, and very supportive of wife trying to improve her health as well.  Encouraged patient to keep continuing his practice of more self affirming behaviors as noted in sessions including working on these both in sessions and outside of sessions: Being more committed to ongoing work with his treatment goal plan and following through with specific steps as discussed in sessions, being more self-aware and committed to ongoing health status and wanting to be as healthy as possible, stay in touch with supportive people, take breaks as needed, positive self talk and self-care, healthy nutrition and exercise, staying in the present focused on the things he can control, allow his faith to be an emotional support as well as spiritual, continue making his family a high priority including himself, and realize the strength he shows working with goal-directed behaviors as he moves in a direction that supports his improved  emotional health and his outlook into the future.  Travaris Widrick has made progress and needs to continue working with his goal-directed behaviors to move in a healthier and more hopeful direction.  Goal review and progress/challenges noted with patient.  Next appointment within 3 weeks.   Kelleen Patee,  LCSW

## 2023-12-20 ENCOUNTER — Encounter: Payer: Self-pay | Admitting: Cardiology

## 2023-12-20 NOTE — Telephone Encounter (Signed)
 Recommend he take dose tomorrow morning so he can contact clinic if he has any adverse effects   C. Pavero , [facility]   12/20/23

## 2023-12-21 ENCOUNTER — Ambulatory Visit: Admitting: Psychiatry

## 2023-12-21 DIAGNOSIS — F411 Generalized anxiety disorder: Secondary | ICD-10-CM

## 2023-12-21 NOTE — Progress Notes (Signed)
 [facility] Counselor/Therapist Progress Note  Patient ID: Dustin Rice, MRN: [id number],    Date: 12/21/2023  Time Spent: 50 minutes   Treatment Type: Individual Therapy  Reported Symptoms: anxiety decreased some, stressed re: work, wife's medical issue, some anger that was stress related   Mental Status Exam:  Appearance:   Casual     Behavior:  Appropriate, Sharing, and Motivated  Motor:  Normal  Speech/Language:   Clear and Coherent  Affect:  anxious  Mood:  anxious and some depression but improved  Thought process:  goal directed  Thought content:    WNL  Sensory/Perceptual disturbances:    WNL  Orientation:  oriented to person, place, time/date, situation, day of week, month of year, year, and stated date of Dec 21, 2023  Attention:  Good  Concentration:  Good and Fair  Memory:  WNL  Fund of knowledge:   Good  Insight:    Good  Judgment:   Good  Impulse Control:  Good   Risk Assessment: Danger to Self:  No Self-injurious Behavior: No Danger to Others: No Duty to Warn:no Physical Aggression / Violence:No  Access to Firearms a concern: No  Gang Involvement:No   Subjective:    Patient in session today continuing his work on anxiety, better managing stress, some depression that is improving significantly, and personal/situational family issues related to his and the family's health concerns. Wife is progressing  with her back issues and hopeful of going on the [place] trip. Anxiety re: wife's diabetes and the ups and downs. Processing his concerns about some "issues in the future" for he and wife, and patient was very thorough in his concerns for wife especially.  He and wife do seem to be progressing in their communication with each other and working together more as a team. Also talked through and shared more specifics in interrupting thoughts that are unhelpful.    Interventions: Cognitive Behavioral Therapy and Ego-Supportive Long term goal: Reduce overall  level, frequency, and intensity of anxiety and depression so that daily functioning is not impaired. Short term goal: Increase understanding of beliefs and messages that produce worry and anxiety. Strategies: Identify and use specific coping strategies for anxiety and depression reduction.  Also explore alternative ways of communicating better with spouse and being more sensitive to needs within the family, including both wife and son.    Diagnosis:   ICD-10-CM   1. Generalized anxiety disorder  F41.1      Plan:   Patient in session today actively participating and showing good motivation focusing further on his stress and anxiety, with his depression significantly decreasing. Frustration and anxiety stronger. Patient very committed to improving his emotional and physical health and encourages his wife in her trying to be healthier as well.  Reminded and encouraged patient to continue his practice of self affirming behaviors as noted in sessions including working on these both in sessions and outside of sessions in addition to: Being more committed to ongoing work with his treatment goal plan and following through with specific steps as discussed in sessions, being more self-aware and committed to ongoing health status and wanting to be as healthy as possible, stay in touch with supportive people, take breaks as needed, positive self talk and self-care, healthy nutrition and exercise, staying in the present focused on the things he can control, allow his faith to be an emotional support as well as spiritual, continue making his family a high priority including himself, and realize  the strength he shows when working with goal-directed behaviors as he moves in a direction that supports his improved emotional health and overall wellbeing.  Dejay Sookdeo continues to make progress and needs to continue his work with goal-directed behaviors moving in a healthier and more hopeful direction.  Goal review and  progress/challenges noted with patient.  Next appointment within 3 weeks.   Kelleen Patee, LCSW

## 2024-01-02 ENCOUNTER — Other Ambulatory Visit (HOSPITAL_BASED_OUTPATIENT_CLINIC_OR_DEPARTMENT_OTHER): Payer: Self-pay

## 2024-01-03 ENCOUNTER — Other Ambulatory Visit (HOSPITAL_BASED_OUTPATIENT_CLINIC_OR_DEPARTMENT_OTHER): Payer: Self-pay

## 2024-01-04 ENCOUNTER — Ambulatory Visit (INDEPENDENT_AMBULATORY_CARE_PROVIDER_SITE_OTHER): Payer: Self-pay | Admitting: Psychiatry

## 2024-01-04 ENCOUNTER — Other Ambulatory Visit (HOSPITAL_BASED_OUTPATIENT_CLINIC_OR_DEPARTMENT_OTHER): Payer: Self-pay

## 2024-01-04 DIAGNOSIS — F411 Generalized anxiety disorder: Secondary | ICD-10-CM

## 2024-01-04 NOTE — Progress Notes (Signed)
  Patient no-showed for appt today.  Dustin Rice

## 2024-01-17 ENCOUNTER — Other Ambulatory Visit (HOSPITAL_BASED_OUTPATIENT_CLINIC_OR_DEPARTMENT_OTHER): Payer: Self-pay

## 2024-01-19 ENCOUNTER — Ambulatory Visit: Admitting: Podiatry

## 2024-01-19 ENCOUNTER — Encounter: Payer: Self-pay | Admitting: Podiatry

## 2024-01-19 DIAGNOSIS — B353 Tinea pedis: Secondary | ICD-10-CM

## 2024-01-19 MED ORDER — TERBINAFINE HCL 250 MG PO TABS: 250.0000 mg | ORAL_TABLET | Freq: Every day | ORAL | 0 refills | Status: DC

## 2024-01-19 NOTE — Progress Notes (Signed)
 Here Subjective:  Patient ID: Dustin Rice, male    DOB: 05-12-1956,  MRN: [id number] HPI Chief Complaint  Patient presents with   Skin Problem    Right foot - possible tinea - 2nd opinion: Dermatologist evaluated 1-2 weeks ago, Rx'd 2 creams, no help, so follow up did culture-showed staph-put on Doxy, then he was called and said it was resistant to Doxy, so switched to Augmentin    68 y.o. male presents with the above complaint.   ROS: Denies fever chills nausea mobic  muscle aches pains calf pain back pain chest pain shortness of breath.  Past Medical History:  Diagnosis Date   Allergic rhinitis    Cervical spine disease    Esophageal reflux    GERD (gastroesophageal reflux disease)    Gilbert's syndrome    Hypercholesterolemia    Hyperlipidemia    Inguinal hernia    RIGHT   Prostatitis    RBBB    Shingles 03/2014   Past Surgical History:  Procedure Laterality Date   HERNIA REPAIR  2004    Current Outpatient Medications:    amoxicillin-clavulanate (AUGMENTIN) 875-125 MG tablet, 1 tablet Orally every 12 hrs for 10 day(s), Disp: , Rfl:    terbinafine (LAMISIL) 250 MG tablet, Take 1 tablet (250 mg total) by mouth daily., Disp: 30 tablet, Rfl: 0   alfuzosin (UROXATRAL) 10 MG 24 hr tablet, Take 10 mg by mouth once., Disp: , Rfl:    Alirocumab  (PRALUENT ) 75 MG/ML SOAJ, Inject 1 mL (75 mg total) into the skin every 14 (fourteen) days., Disp: 2 mL, Rfl: 2   betamethasone dipropionate 0.05 % cream, , Disp: , Rfl:    cetirizine (ZYRTEC) 10 MG tablet, Take 10 mg by mouth daily., Disp: , Rfl:    Cholecalciferol (VITAMIN D-1000 Shanea Karney ST) 25 MCG (1000 UT) tablet, , Disp: , Rfl:    ezetimibe (ZETIA) 10 MG tablet, Take 10 mg by mouth daily., Disp: , Rfl:    FLUTICASONE PROPIONATE EX, Place 50 mcg into both nostrils., Disp: , Rfl:    hydrocortisone (ANUSOL-HC) 25 MG suppository, Place 25 mg rectally daily., Disp: , Rfl:    ibuprofen (ADVIL,MOTRIN) 200 MG tablet, Take 200 mg by mouth every  6 (six) hours as needed for mild pain (pain score 1-3)., Disp: , Rfl:    Melatonin 5 MG TABS, Take 5 mg by mouth at bedtime as needed (sleep). , Disp: , Rfl:    meloxicam  (MOBIC ) 7.5 MG tablet, Take 1 tablet (7.5 mg total) by mouth daily as needed for up to 14 doses for pain., Disp: 60 tablet, Rfl: 2   tadalafil (CIALIS) 5 MG tablet, Take 5 mg by mouth daily., Disp: , Rfl:    traMADol (ULTRAM) 50 MG tablet, Take 50 mg by mouth every 6 (six) hours as needed., Disp: , Rfl:   Allergies  Allergen Reactions   Erythromycin Other (See Comments)    GI UPSET   Rosuvastatin  Other (See Comments)    myalgias   Review of Systems Objective:  There were no vitals filed for this visit.  General: Well developed, nourished, in no acute distress, alert and oriented x3   Dermatological: Skin is warm, dry and supple bilateral. Nails x 10 are well maintained; remaining integument appears unremarkable at this time. There are no open sores, no preulcerative lesions, rash restricted pretty much to the right foot demonstrates multiple small bullae and vesicles filled with fluid which appears to be more of a vesicular tinea particularly with small  petechial type lesions around the plantar and the margin of the foot and sole.  Mycotic nail hallux left  Vascular: Dorsalis Pedis artery and Posterior Tibial artery pedal pulses are 2/4 bilateral with immedate capillary fill time. Pedal hair growth present. No varicosities and no lower extremity edema present bilateral.   Neruologic: Grossly intact via light touch bilateral. Vibratory intact via tuning fork bilateral. Protective threshold with Semmes Wienstein monofilament intact to all pedal sites bilateral. Patellar and Achilles deep tendon reflexes 2+ bilateral. No Babinski or clonus noted bilateral.   Musculoskeletal: No gross boney pedal deformities bilateral. No pain, crepitus, or limitation noted with foot and ankle range of motion bilateral. Muscular strength 5/5 in  all groups tested bilateral.  Gait: Unassisted, Nonantalgic.    Radiographs:  None given  Assessment & Plan:   Assessment: Most likely vesicular tinea cannot rule out staph infection  Plan: Recommended he continue his Augmentin for the next 10 days and I started him on oral terbinafine 1 tablet daily for the next 30 days we will follow-up with him in 1 month     Dustin Rice, [place]

## 2024-01-21 ENCOUNTER — Other Ambulatory Visit (HOSPITAL_BASED_OUTPATIENT_CLINIC_OR_DEPARTMENT_OTHER): Payer: Self-pay

## 2024-01-31 ENCOUNTER — Encounter: Payer: Self-pay | Admitting: Podiatry

## 2024-02-02 ENCOUNTER — Encounter: Payer: Self-pay | Admitting: Podiatry

## 2024-02-02 MED ORDER — AMOXICILLIN-POT CLAVULANATE 875-125 MG PO TABS: 1.0000 | ORAL_TABLET | Freq: Two times a day (BID) | ORAL | 0 refills | Status: DC

## 2024-02-13 ENCOUNTER — Ambulatory Visit: Admitting: Psychiatry

## 2024-02-13 ENCOUNTER — Ambulatory Visit

## 2024-02-23 ENCOUNTER — Encounter: Payer: Self-pay | Admitting: Podiatry

## 2024-02-23 ENCOUNTER — Ambulatory Visit (INDEPENDENT_AMBULATORY_CARE_PROVIDER_SITE_OTHER): Admitting: Podiatry

## 2024-02-23 DIAGNOSIS — B353 Tinea pedis: Secondary | ICD-10-CM | POA: Diagnosis not present

## 2024-02-26 NOTE — Progress Notes (Signed)
 He presents today for follow-up of his rash to the plantar aspect of his right foot he sees While he was in [place] he had a bad breakout and he has seen dermatology since he returned who put him on silver  sulfadiazine  cream.  He states it looks some better Olam does not look as infected.  Objective: Vital signs stable oriented x 3 much improved from last visit no vesicular lesions present however he does have dry due to a tight patches of skin with interrupted margin and erythema surrounding the margin with a clear center.  Assessment: Still cannot rule out psoriatic source or eczema.  Plan: I took samples of the margins today scrapings and clippings to be sent for pathologic evaluation we will follow-up with him in about 3 weeks.

## 2024-02-28 ENCOUNTER — Ambulatory Visit (INDEPENDENT_AMBULATORY_CARE_PROVIDER_SITE_OTHER): Admitting: Psychiatry

## 2024-02-28 DIAGNOSIS — F411 Generalized anxiety disorder: Secondary | ICD-10-CM

## 2024-02-28 NOTE — Progress Notes (Signed)
 [facility] Counselor/Therapist Progress Note  Patient ID: Dustin Rice, MRN: [id number],    Date: 02/28/2024  Time Spent: 55 minutes   Treatment Type: Individual Therapy  Reported Symptoms: anxiety, stressed     Mental Status Exam:  Appearance:   Casual and Neat     Behavior:  Appropriate, Sharing, and Motivated  Motor:  Normal  Speech/Language:   Clear and Coherent  Affect:  anxious  Mood:  anxious  Thought process:  goal directed  Thought content:    WNL  Sensory/Perceptual disturbances:    WNL  Orientation:  oriented to person, place, time/date, situation, day of week, month of year, year, and stated date of February 28, 2024  Attention:  Good  Concentration:  Good  Memory:  WNL  Fund of knowledge:   Good  Insight:    Good  Judgment:   Good  Impulse Control:  Good   Risk Assessment: Danger to Self:  No Self-injurious Behavior: No Danger to Others: No Duty to Warn:no Physical Aggression / Violence:No  Access to Firearms a concern: No  Gang Involvement:No   Subjective:  Patient today focusing in session on his trying to manage stress better, anxiety, some depression and personal/situational family issues related to family health concerns. Recent foot issue but is getting better. Wife just had back surgery and is gradually progressing.Ups and downs with wife's diabetes but doing some better now. Concern about their health issues in gradual aging. As a couple, their concerns about their health has increased some. Did make it ok on their [place] trip, with some challenges. Patient concerned about a place on my foot and has appt with foot Dr tomorrow morning. Reports some improvement in marital relationship which has been a work in progress. Trying to help his elderly mother age 52 who lives in her own home with assistance from family and other health care aides.    Interventions: Cognitive Behavioral Therapy, Solution-Oriented/Positive Psychology, and  Ego-Supportive Long term goal: Reduce overall level, frequency, and intensity of anxiety and depression so that daily functioning is not impaired. Short term goal: Increase understanding of beliefs and messages that produce worry and anxiety. Strategies: Identify and use specific coping strategies for anxiety and depression reduction.  Also explore alternative ways of communicating better with spouse and being more sensitive to needs within the family, including both wife and son.    Diagnosis:   ICD-10-CM   1. Generalized anxiety disorder  F41.1      Plan: Patient today working further on his anxiety which has decreased after being on vacation. Good motivation. Getting back involved with his work. Trying to not overcommit himself now that he is back from vacation and continue working on getting his mental and physical health better and not over-booking myself at work. Frustration is less. Commitment to his emotional and physical health and helping his wife stay as healthy as possible also. Wanting to make a stronger commitment to himself to improve and be in better health emotionally and physically. Reminded and encouraged patient in continuing his practice of self affirming behaviors as noted in sessions including working on these and sessions and outside of sessions in addition to: Being more committed to ongoing work with his treatment goal plan and following through with specific steps as discussed in sessions, being more self-aware and committed to ongoing health status and wanting to be as healthy as possible, stay in touch with supportive people, take breaks as needed, positive self talk and  self-care, healthy nutrition and exercise, staying in the present focused on the things he can control, allow his faith to be an emotional support as well as spiritual, continue making his family a high priority including himself, and recognize the strength he shows when working with goal-directed  behaviors as he moves in a direction that supports his improved emotional health and overall wellbeing.  Dustin Rice does continue to make progress and recognizes his need to continue working with goal-directed behaviors to move in a healthier and more hopeful direction into the future.  Goal review and progress/challenges noted with patient.  Next appointment within 3 weeks.   Barnie Bunde, LCSW

## 2024-02-29 ENCOUNTER — Encounter: Payer: Self-pay | Admitting: Podiatry

## 2024-02-29 ENCOUNTER — Ambulatory Visit (INDEPENDENT_AMBULATORY_CARE_PROVIDER_SITE_OTHER): Admitting: Podiatry

## 2024-02-29 DIAGNOSIS — L02619 Cutaneous abscess of unspecified foot: Secondary | ICD-10-CM | POA: Diagnosis not present

## 2024-02-29 DIAGNOSIS — L03119 Cellulitis of unspecified part of limb: Secondary | ICD-10-CM | POA: Diagnosis not present

## 2024-02-29 MED ORDER — SILVER SULFADIAZINE 1 % EX CREA: 1.0000 | TOPICAL_CREAM | Freq: Every day | CUTANEOUS | 3 refills | Status: AC

## 2024-02-29 MED ORDER — DOXYCYCLINE HYCLATE 100 MG PO TABS: 100.0000 mg | ORAL_TABLET | Freq: Two times a day (BID) | ORAL | 0 refills | Status: DC

## 2024-02-29 NOTE — Progress Notes (Signed)
 He presents today with right ankle tenderness laterally states that has been swelling and redness with heat for the past few days.  Thought it might be a bug bite but was concerned.  He states that is gone down considerably.  Objective: Vital signs are stable alert oriented x 3.  He has some mild erythema proximal to the lateral malleolus and a very diffuse distribution.  There does not appear to be any streaking and there is no sharp margin.  There does appear to be a central bite mark or puncture from a thorn or something similar.  So this could be an insect or plant that could have resulted in a similar wound.  Does not appear to be gout.  Assessment possible insect bite.  Diffuse cellulitis lateral aspect right foot ankle.  Plan: Start him on 10-day course of doxycycline  follow-up with him in 2 weeks.

## 2024-03-01 ENCOUNTER — Other Ambulatory Visit: Payer: Self-pay

## 2024-03-01 ENCOUNTER — Encounter: Payer: Self-pay | Admitting: Cardiology

## 2024-03-01 ENCOUNTER — Other Ambulatory Visit (HOSPITAL_BASED_OUTPATIENT_CLINIC_OR_DEPARTMENT_OTHER): Payer: Self-pay

## 2024-03-01 ENCOUNTER — Other Ambulatory Visit: Payer: Self-pay | Admitting: Cardiology

## 2024-03-01 DIAGNOSIS — E785 Hyperlipidemia, unspecified: Secondary | ICD-10-CM

## 2024-03-01 DIAGNOSIS — R931 Abnormal findings on diagnostic imaging of heart and coronary circulation: Secondary | ICD-10-CM

## 2024-03-01 MED ORDER — PRALUENT 75 MG/ML ~~LOC~~ SOAJ
75.0000 mg | SUBCUTANEOUS | 1 refills | Status: DC
  Filled 2024-03-01: qty 6, 84d supply, fill #0
  Filled 2024-03-02: qty 2, 28d supply, fill #0
  Filled 2024-03-30: qty 2, 28d supply, fill #1
  Filled 2024-04-23: qty 2, 28d supply, fill #2
  Filled 2024-05-24: qty 2, 28d supply, fill #3
  Filled 2024-06-15 (×2): qty 2, 28d supply, fill #4
  Filled 2024-07-17: qty 2, 28d supply, fill #5

## 2024-03-02 ENCOUNTER — Other Ambulatory Visit (HOSPITAL_BASED_OUTPATIENT_CLINIC_OR_DEPARTMENT_OTHER): Payer: Self-pay

## 2024-03-10 ENCOUNTER — Encounter: Payer: Self-pay | Admitting: Cardiology

## 2024-03-10 DIAGNOSIS — E785 Hyperlipidemia, unspecified: Secondary | ICD-10-CM

## 2024-03-10 LAB — LIPID PANEL
Chol/HDL Ratio: 3.6 ratio (ref 0.0–5.0)
Cholesterol, Total: 182 mg/dL (ref 100–199)
HDL: 51 mg/dL (ref >39)
LDL Chol Calc (NIH): 109 mg/dL — ABNORMAL HIGH (ref 0–99)
Triglycerides: 125 mg/dL (ref 0–149)
VLDL Cholesterol Cal: 22 mg/dL (ref 5–40)

## 2024-03-11 ENCOUNTER — Ambulatory Visit: Payer: Self-pay | Admitting: Cardiology

## 2024-03-11 DIAGNOSIS — E785 Hyperlipidemia, unspecified: Secondary | ICD-10-CM

## 2024-03-12 NOTE — Telephone Encounter (Signed)
-----   Message from Lynwood Schilling sent at 03/11/2024  2:32 PM EDT ----- Repeat lipid in 3 months on the Praluent .  Call Mr. Kitt with the results and send results to Charlott Dorn LABOR, MD ----- Message ----- From: Rebeck[facility] Results In Sent: 03/10/2024   1:35 AM EDT To: Lynwood Schilling, MD

## 2024-03-12 NOTE — Telephone Encounter (Signed)
 The patient has been notified of the result and verbalized understanding.  All questions (if any) were answered. Aleck LOISE Bill, RN 03/12/2024 4:34 PM  Labs ordered for 3 months out. Pt aware. Results sent to PCP.

## 2024-03-13 ENCOUNTER — Ambulatory Visit: Admitting: Podiatry

## 2024-03-14 ENCOUNTER — Ambulatory Visit (INDEPENDENT_AMBULATORY_CARE_PROVIDER_SITE_OTHER): Admitting: Podiatry

## 2024-03-14 DIAGNOSIS — B351 Tinea unguium: Secondary | ICD-10-CM | POA: Diagnosis not present

## 2024-03-14 MED ORDER — TERBINAFINE HCL 250 MG PO TABS: 250.0000 mg | ORAL_TABLET | Freq: Every day | ORAL | 2 refills | Status: AC

## 2024-03-14 NOTE — Progress Notes (Signed)
 He presents today 3 weeks follow-up cellulitis right ankle states that is feeling a lot better mildly tender and mildly discolored.  He is concerned about discoloration of hallux nail left  Objective: Vital signs are stable he is alert oriented x 3.  Pulses are palpable.  All of the cellulitis has resolved he is does have a small area just on the lateral malleolus of a area of postinflammatory hyperpigmentation measuring about the size of a quarter.  All of his plantar lesions have resolved at this time he continues to apply his silver  Silvadene .  Pathology did state most likely nummular eczema or atopic dermatitis.  Assessment: Resolving cellulitis atopic dermatitis or nummular eczema plantar foot.  Onychomycosis hallux left  Plan: At this point he is going to continue his Silvadene  cream and I started him on Lamisil  250 mg tablets 1 p.o. daily and I will follow-up with him in 3 months dispense 90.

## 2024-03-19 ENCOUNTER — Ambulatory Visit: Admitting: Psychiatry

## 2024-03-20 ENCOUNTER — Ambulatory Visit (INDEPENDENT_AMBULATORY_CARE_PROVIDER_SITE_OTHER): Admitting: Psychiatry

## 2024-03-20 DIAGNOSIS — F411 Generalized anxiety disorder: Secondary | ICD-10-CM | POA: Diagnosis not present

## 2024-03-20 NOTE — Progress Notes (Signed)
 [facility] Counselor/Therapist Progress Note  Patient ID: Dustin Rice, MRN: [id number],    Date: 03/20/2024  Time Spent: 53 minutes   Treatment Type: Individual Therapy  Reported Symptoms: anxiety, stressed   Mental Status Exam:  Appearance:   Casual     Behavior:  Appropriate, Rigid, and Motivated  Motor:  Normal  Speech/Language:   Clear and Coherent  Affect:  Anxious, stressed  Mood:  anxious and depressed  Thought process:  goal directed  Thought content:    WNL  Sensory/Perceptual disturbances:    WNL  Orientation:  oriented to person, place, time/date, situation, day of week, month of year, year, and stated date of Aug. 12, 2025  Attention:  Good  Concentration:  Good and Fair  Memory:  WNL  Fund of knowledge:   Good  Insight:    Good  Judgment:   Good  Impulse Control:  Good   Risk Assessment: Danger to Self:  No Self-injurious Behavior: No Danger to Others: No Duty to Warn:no Physical Aggression / Violence:No  Access to Firearms a concern: No  Gang Involvement:No   Subjective:  Patient in session today reporting anxiety, trying to manage stress better, still noticing some depression and personal/situational family issues related to health.  Trying to be more supportive of wife who recently had back surgery. Some anxiety re: elderly mother and her ongoing care, decision-making for mother where there are some extra concerns right now.  Talked about this in more detail.  (Not all details included in this note due to patient privacy needs).  Patient is working to make some potential changes and care for his mother, along with the help of his adult siblings and making decisions.  Enjoying relationship with his young adult son.  Placed on patient's foot seems to be healing up better.  Improvement in marital relationship is ongoing.  Does feel like he is managing the stressors more effectively at times and is working to make this more  consistent.   Interventions: Cognitive Behavioral Therapy, Solution-Oriented/Positive Psychology, and Ego-Supportive Long term goal: Reduce overall level, frequency, and intensity of anxiety and depression so that daily functioning is not impaired. Short term goal: Increase understanding of beliefs and messages that produce worry and anxiety. Strategies: Identify and use specific coping strategies for anxiety and depression reduction.  Also explore alternative ways of communicating better with spouse and being more sensitive to needs within the family, including both wife and son.     Diagnosis:   ICD-10-CM   1. Generalized anxiety disorder  F41.1      Plan: Patient in session today continuing to work on his anxiety and trying to better manage stress for his emotional and physical health.  Did notice that his anxiety was decreased during his time of being away on vacation.  Today seems to be well-motivated and has gotten back into his routine of working but trying not to overcome it himself.  Does note that his frustration continues to be decreased.  More focused now on his emotional and physical health and trying to help himself and his wife get and stay as healthy as possible. Encouraged patient in continuing his practice of self affirming behaviors as noted in sessions including working on these in and outside of sessions in addition to: Being more committed to ongoing work with his treatment goal plan and following through with specific steps as discussed in sessions, being more self-aware and committed to ongoing health status and wanting to  be as healthy as possible, stay in touch with supportive people, take breaks as needed, positive self talk and self-care, healthy nutrition and exercise, staying in the present focused on the things he can control, allow his faith to be an emotional support as well as spiritual, continue making his family a high priority including himself, and realize the  strength he shows when he works with goal-directed behaviors as he moves in a direction that supports his improved emotional health and his outlook into the future.  Tyrus Wilms has made progress and recognizes his need to continue working with goal-directed behaviors to keep moving in a more hopeful and healthier direction going forward.  Goal review and progress/challenges noted with patient.  Next appointment within 3 weeks.   Barnie Bunde, LCSW

## 2024-03-22 ENCOUNTER — Encounter: Payer: Self-pay | Admitting: Podiatry

## 2024-03-23 ENCOUNTER — Encounter: Payer: Self-pay | Admitting: Podiatry

## 2024-03-23 ENCOUNTER — Ambulatory Visit (INDEPENDENT_AMBULATORY_CARE_PROVIDER_SITE_OTHER)

## 2024-03-23 ENCOUNTER — Ambulatory Visit (INDEPENDENT_AMBULATORY_CARE_PROVIDER_SITE_OTHER): Admitting: Podiatry

## 2024-03-23 DIAGNOSIS — L03119 Cellulitis of unspecified part of limb: Secondary | ICD-10-CM

## 2024-03-23 DIAGNOSIS — L02619 Cutaneous abscess of unspecified foot: Secondary | ICD-10-CM

## 2024-03-23 MED ORDER — AMOXICILLIN-POT CLAVULANATE 875-125 MG PO TABS: 1.0000 | ORAL_TABLET | Freq: Two times a day (BID) | ORAL | 0 refills | Status: DC

## 2024-03-23 NOTE — Telephone Encounter (Signed)
 Per our conversation, I have you coming in to see Dr Magdalen at 10:30 am this morning in [place]. Thank you

## 2024-03-23 NOTE — Progress Notes (Signed)
 Subjective:   Patient ID: Dustin Rice, male   DOB: 68 y.o.   MRN: [id number]   HPI Patient presents concerned because he had a bacterial infection several months ago and it seems that it is tracking the same way   ROS      Objective:  Physical Exam  Neurovascular status intact patient is on oral antifungal for fungus infection and is being treated also by dermatologist with several blisters plantar aspect right foot but I did not note any erythema edema or drainage coming from this area     Assessment:  Possibility for a bacterial infection that had been present earlier and was treated with antibiotics successfully with also history of fungal and eczema     Plan:  H&P reviewed and at this point started on antibiotics Augmentin  875 mg twice daily strict instructions of any systemic signs of infection were to occur to go to the emergency room and will be seen back for evaluation by Dr. Verta in the next 2 to 3 weeks  Precautionary x-ray taken right was negative for signs of osteolysis or soft tissue bubbling

## 2024-03-27 ENCOUNTER — Encounter: Payer: Self-pay | Admitting: Podiatry

## 2024-03-30 ENCOUNTER — Other Ambulatory Visit (HOSPITAL_BASED_OUTPATIENT_CLINIC_OR_DEPARTMENT_OTHER): Payer: Self-pay

## 2024-04-02 MED ORDER — AMOXICILLIN-POT CLAVULANATE 500-125 MG PO TABS
1.0000 | ORAL_TABLET | Freq: Three times a day (TID) | ORAL | 0 refills | Status: DC
Start: 2024-04-02 — End: 2024-04-26

## 2024-04-05 ENCOUNTER — Ambulatory Visit (INDEPENDENT_AMBULATORY_CARE_PROVIDER_SITE_OTHER): Admitting: Podiatry

## 2024-04-05 ENCOUNTER — Encounter: Payer: Self-pay | Admitting: Podiatry

## 2024-04-05 DIAGNOSIS — L02619 Cutaneous abscess of unspecified foot: Secondary | ICD-10-CM

## 2024-04-05 DIAGNOSIS — B351 Tinea unguium: Secondary | ICD-10-CM

## 2024-04-05 DIAGNOSIS — B353 Tinea pedis: Secondary | ICD-10-CM

## 2024-04-05 DIAGNOSIS — K909 Intestinal malabsorption, unspecified: Secondary | ICD-10-CM | POA: Insufficient documentation

## 2024-04-05 DIAGNOSIS — L03119 Cellulitis of unspecified part of limb: Secondary | ICD-10-CM

## 2024-04-05 DIAGNOSIS — K648 Other hemorrhoids: Secondary | ICD-10-CM | POA: Insufficient documentation

## 2024-04-05 DIAGNOSIS — N401 Enlarged prostate with lower urinary tract symptoms: Secondary | ICD-10-CM | POA: Insufficient documentation

## 2024-04-05 DIAGNOSIS — Z1211 Encounter for screening for malignant neoplasm of colon: Secondary | ICD-10-CM | POA: Insufficient documentation

## 2024-04-05 DIAGNOSIS — K219 Gastro-esophageal reflux disease without esophagitis: Secondary | ICD-10-CM | POA: Insufficient documentation

## 2024-04-05 DIAGNOSIS — M5442 Lumbago with sciatica, left side: Secondary | ICD-10-CM | POA: Insufficient documentation

## 2024-04-05 DIAGNOSIS — G8929 Other chronic pain: Secondary | ICD-10-CM | POA: Insufficient documentation

## 2024-04-05 DIAGNOSIS — K921 Melena: Secondary | ICD-10-CM | POA: Insufficient documentation

## 2024-04-05 DIAGNOSIS — E7800 Pure hypercholesterolemia, unspecified: Secondary | ICD-10-CM | POA: Insufficient documentation

## 2024-04-05 DIAGNOSIS — R1013 Epigastric pain: Secondary | ICD-10-CM | POA: Insufficient documentation

## 2024-04-05 MED ORDER — TRIAMCINOLONE ACETONIDE 0.1 % EX CREA: 1.0000 | TOPICAL_CREAM | Freq: Two times a day (BID) | CUTANEOUS | 3 refills | Status: DC

## 2024-04-05 NOTE — Progress Notes (Signed)
 Marcey presents today for follow-up of his dermatitis right foot states that is doing fine but a couple of blisters that popped up in the arch just the other day and started to hurt but now have ruptured and are healing.  He continues the Augmentin  and the Silvadene  cream.  Objective: Vital signs stable alert oriented x 3 plantar aspect of the right foot does demonstrate some scaling of the skin, appears to be dried blisters that are more of a yellow-brown color at this point.  No erythema cellulitis drainage or odor.  No interdigital excoriations no interdigital fissuring.  Assessment I still think this is an eczematous dermatitis possibly by dyshidrotic eczema that will rupture and drain.  Plan: I will start him on 0.1% triamcinolone  clinic cream to be applied when he starts to feel that itchy feeling in the bottom of his foot.  I would also follow-up with nail for biopsy or possible refer him to Dr. ErinEisenstein .

## 2024-04-13 ENCOUNTER — Telehealth: Payer: Self-pay | Admitting: Cardiology

## 2024-04-13 DIAGNOSIS — E785 Hyperlipidemia, unspecified: Secondary | ICD-10-CM

## 2024-04-13 NOTE — Telephone Encounter (Signed)
 Spoke with the patient who states that this morning around 5am he woke up to go to the bathroom and he felt very short of breath. He went to lay back down and symptoms resolved. He denies any associated chest pain. He state that a little bit ago he bent over to pick something up and felt a bit short of breath again but once he stood up it resolved. He is currently feeling fine but does report he feels slightly more fatigued than normal. He will continue to monitor symptoms and let us  know if anything changes. I will make Dr. Lavona aware.

## 2024-04-13 NOTE — Telephone Encounter (Signed)
 Left voice message to call back 9/5

## 2024-04-13 NOTE — Telephone Encounter (Signed)
 Pt c/o Shortness Of Breath: STAT if SOB developed within the last 24 hours or pt is noticeably SOB on the phone  1. Are you currently SOB (can you hear that pt is SOB on the phone)?   No  2. How long have you been experiencing SOB?   Patient had an episode early this morning around 5:00 am  3. Are you SOB when sitting or when up moving around?   Up and moving around   4. Are you currently experiencing any other symptoms?  No   Patient noted he started Alirocumab  (PRALUENT ) 75 MG/ML SOAJ a month ago.  Patient stated symptom subsided after her laid down for a few minutes.

## 2024-04-16 NOTE — Telephone Encounter (Signed)
 Patient returned RN's call.

## 2024-04-16 NOTE — Telephone Encounter (Signed)
 Patient states his symptoms resolved same day (on 9/5) and he has not had anymore since then. He verbalized understanding of Dr. Denver recommendation to be seen if symptom reoccur or persist.  Patient voiced concern regarding his cholesterol. Patient discussed his intolerance of statins, for which he was started on Praluent . His follow-up lipid panel showed increase (rather than decrease) in LDL from 75 to 109. He states he has been following Mediterranean diet. He is concerned about this and would like to know if Dr. Lavona has any other recommendations to help lower his LDL.  Patient reports he was originally considering Repatha but his insurance would not cover at that time. He asked if the Praluent  was not showing improvement in LDL, might his insurance considering approving Repatha.  Will forward to Dr. Lavona, Pharm D team and Rx Prior Auth to review.

## 2024-04-17 ENCOUNTER — Ambulatory Visit: Admitting: Podiatry

## 2024-04-17 NOTE — Addendum Note (Signed)
 Addended by: VICCI ROXIE CROME on: 04/17/2024 03:38 PM   Modules accepted: Orders

## 2024-04-19 ENCOUNTER — Ambulatory Visit (INDEPENDENT_AMBULATORY_CARE_PROVIDER_SITE_OTHER): Admitting: Psychiatry

## 2024-04-19 DIAGNOSIS — F411 Generalized anxiety disorder: Secondary | ICD-10-CM | POA: Diagnosis not present

## 2024-04-19 NOTE — Progress Notes (Signed)
 [facility] Counselor/Therapist Progress Note  Patient ID: Dustin Rice, MRN: [id number],    Date: 04/19/2024  Time Spent: 53 minutes   Treatment Type: Individual Therapy  Reported Symptoms:  anxiety, stressed    Mental Status Exam:  Appearance:   Casual and Neat     Behavior:  Appropriate, Sharing, and Motivated  Motor:  Normal  Speech/Language:   Clear and Coherent  Affect:  anxious  Mood:  anxious  Thought process:  goal directed  Thought content:    WNL  Sensory/Perceptual disturbances:    WNL  Orientation:  oriented to person, place, time/date, situation, day of week, month of year, year, and stated date of Sept. 11, 2025  Attention:  Good  Concentration:  Good  Memory:  WNL  Fund of knowledge:   Good  Insight:    Good  Judgment:   Good  Impulse Control:  Good   Risk Assessment: Danger to Self:  No Self-injurious Behavior: No Danger to Others: No Duty to Warn:no Physical Aggression / Violence:No  Access to Firearms a concern: No  Gang Involvement:No   Subjective:  Patient today reports anxiety and stress as current symptoms. States anxiety and stress related to family stressors, health challenges of wife, leading to extra stress in their relationship and coping. Wife currently having some health concerns including blood sugar issues, which has led to some difficulty in communication with husband, which husband has found very challenging with which to deal. Needing tools to help diffuse situations with wife especially when her health issues peak; noticing some possible health issues and is doing a sleep apnea test soon. Continues to be more conscious about his and his wife's health and is trying to make healthier decisions some of which he has shared in sessions.  Is also working on his communication with wife especially during times when both may be frustrated, angry, or worried.  Did express some concerns about his adult son who lives in [place] .  (Not all  details included in this note due to patient privacy needs.) Patient definitely trying to be more focused on his health as he ages.  Also is helping look after the affairs and care and decision making of his elderly mother who has caretakers with her during the day and night.  Hoping that he and wife can continue to grow in their communication skills which enhances their marital relationship.   Interventions: Cognitive Behavioral Therapy, Solution-Oriented/Positive Psychology, and Ego-Supportive Long term goal: Reduce overall level, frequency, and intensity of anxiety and depression so that daily functioning is not impaired. Short term goal: Increase understanding of beliefs and messages that produce worry and anxiety. Strategies: Identify and use specific coping strategies for anxiety and depression reduction.  Also explore alternative ways of communicating better with spouse and being more sensitive to needs within the family, including both wife and son.   Diagnosis:   ICD-10-CM   1. Generalized anxiety disorder  F41.1      Plan:   Patient working today further on his anxiety and stress particularly as it relates to his marriage and family, and in some cases health issues.  Wife is currently having significant back issues.  Had a good vacation earlier this summer together, and did notice that his anxiety decreases when he is away on vacation.  Easy to get stressed about his physical and and emotional health but also is working with some strategies as discussed in session to help him stay more leveled  out, not to make quick assumptions that something may be wrong or worse, continue healthy habits and periodic doctor visits as needed, aching had a priority to be as healthy as possible, and be looking more for what might go right versus wrong. Continue to encourage patient to keep working with self affirming behaviors as noted in sessions including working on these inside and outside of sessions:   Being more committed to ongoing work with his treatment goals, being more self-aware and committed to personal health goals, stay in touch with supportive people, take breaks as needed, positive self talk and self-care, healthy nutrition and exercise, staying in the present focused on the things he can control, allow his faith to be an emotional support as well as spiritual, continue making his family a high priority including himself, and recognize the strength he shows when he works with goal-directed behaviors as he moves in a direction that supports his improved emotional health and his outlook into the future.  Dustin Rice has made progress and continues to recognize his need to keep working with goal-directed behaviors moving in a more hopeful and healthier direction into the future.  Goal review and progress/challenges noted with patient.  Next appointment within 3 weeks.   Barnie Bunde, LCSW

## 2024-04-20 NOTE — Telephone Encounter (Signed)
 Spoke with pt and he is aware of referral to [facility]. Pt advised to call us  or 911 if he has another episode of shortness of breath. Pt verbalized understanding. All questions if any were answered.

## 2024-04-23 ENCOUNTER — Other Ambulatory Visit (HOSPITAL_BASED_OUTPATIENT_CLINIC_OR_DEPARTMENT_OTHER): Payer: Self-pay

## 2024-04-26 ENCOUNTER — Ambulatory Visit (INDEPENDENT_AMBULATORY_CARE_PROVIDER_SITE_OTHER): Admitting: Podiatry

## 2024-04-26 ENCOUNTER — Telehealth: Payer: Self-pay | Admitting: Cardiology

## 2024-04-26 DIAGNOSIS — L600 Ingrowing nail: Secondary | ICD-10-CM | POA: Diagnosis not present

## 2024-04-26 MED ORDER — NEOMYCIN-POLYMYXIN-HC 3.5-10000-1 OT SOLN: OTIC | 0 refills | Status: AC

## 2024-04-26 NOTE — Telephone Encounter (Signed)
 Spoke with pt regarding his leg cramping. Pt stated his left leg had been cramping and is now very sore. Pt denies any redness or swelling. Pt stated his leg is sore to the touch. Pt was told to follow up with his PCP regarding cramping. Pt verbalized understanding. All questions if any were answered.

## 2024-04-26 NOTE — Progress Notes (Signed)
 He presents today for chief complaint of a painful ingrown toenail to the tibial-fibular border of the hallux right.  He has a history of eczema that was confirmed via biopsy last visit.  He states ingrown's been going on for quite some time now he just got more pain for like to have something done about it.  Objective: I reviewed his past medical history medications allergies surgery social history.  Pulses are palpable.  Currently the steroid we put him on has resolved his eczema.  He does have a sharp incurvated nail margin along the tibial-fibular border the hallux right with granulation tissue to the fibular border of the hallux right.  Assessment: Ingrown toenail to the inferior border of the hallux right.  Resolving eczematous dermatitis.  Plan: Discussed etiology pathology and surgical therapies at this point performed a chemical matricectomy to both borders of the right hallux today.  He tolerated that well after local anesthetic was administered.  The borders were split from distal to proximal avulsed exposing the matrix.  Once matrix was exposed 3 doses of phenol were applied 30 seconds each and then neutralized isopropyl alcohol Silvadene  cream Telfa pad and dressed a compressive dressing was applied he was given both oral and medical instructions for the care and soaking of the toe as well as a prescription for Cortisporin Otic to be applied twice daily after soaking.  He will continue the use of his topical steroid after he redevelops Sominex.

## 2024-04-26 NOTE — Patient Instructions (Addendum)
 Betadine Soak Instructions  Purchase an 8 oz. bottle of BETADINE solution (Povidone)  THE DAY AFTER THE PROCEDURE  Place 1 tablespoon of betadine solution in a quart of warm tap water.  Submerge your foot or feet with outer bandage intact for the initial soak; this will allow the bandage to become moist and wet for easy lift off.  Once you remove your bandage, continue to soak in the solution for 20 minutes.  This soak should be done twice a day.  Next, remove your foot or feet from solution, blot dry the affected area and cover.  You may use a band aid large enough to cover the area or use gauze and tape.  Apply other medications to the area as directed by the doctor such as cortisporin otic solution (ear drops) or neosporin.  IF YOUR SKIN BECOMES IRRITATED WHILE USING THESE INSTRUCTIONS, IT IS OKAY TO SWITCH TO EPSOM SALTS AND WATER OR WHITE VINEGAR AND WATER.   Long Term Care Instructions-Post Nail Surgery  You have had your ingrown toenail and root treated with a chemical.  This chemical causes a burn that will drain and ooze like a blister.  This can drain for 6-8 weeks or longer.  It is important to keep this area clean, covered, and follow the soaking instructions dispensed at the time of your surgery.  This area will eventually dry and form a scab.  Once the scab forms you no longer need to soak or apply a dressing.  If at any time you experience an increase in pain, redness, swelling, or drainage, you should contact the office as soon as possible.  Place 1/4 cup of epsom salts in a quart of warm tap water.  Submerge your foot or feet in the solution and soak for 20 minutes.  This soak should be done twice a day.  Next, remove your foot or feet from solution, blot dry the affected area. Apply ointment and cover if instructed by your doctor.   IF YOUR SKIN BECOMES IRRITATED WHILE USING THESE INSTRUCTIONS, IT IS OKAY TO SWITCH TO  WHITE VINEGAR AND WATER.  As another alternative soak, you may  use antibacterial soap and water.  Monitor for any signs/symptoms of infection. Call the office immediately if any occur or go directly to the emergency room. Call with any questions/concerns.

## 2024-04-26 NOTE — Telephone Encounter (Signed)
 Pt complaining of severe cramping in legs. Cramping has alleviated, but he is still very sore. Please advise.

## 2024-04-27 ENCOUNTER — Encounter (HOSPITAL_BASED_OUTPATIENT_CLINIC_OR_DEPARTMENT_OTHER): Payer: Self-pay | Admitting: Internal Medicine

## 2024-04-27 ENCOUNTER — Ambulatory Visit (INDEPENDENT_AMBULATORY_CARE_PROVIDER_SITE_OTHER): Admitting: Internal Medicine

## 2024-04-27 VITALS — BP 125/80 | HR 88 | Resp 17 | Ht 67.0 in | Wt 183.0 lb

## 2024-04-27 DIAGNOSIS — I2510 Atherosclerotic heart disease of native coronary artery without angina pectoris: Secondary | ICD-10-CM

## 2024-04-27 DIAGNOSIS — E785 Hyperlipidemia, unspecified: Secondary | ICD-10-CM | POA: Diagnosis not present

## 2024-04-27 DIAGNOSIS — R931 Abnormal findings on diagnostic imaging of heart and coronary circulation: Secondary | ICD-10-CM | POA: Diagnosis not present

## 2024-04-27 MED ORDER — ROSUVASTATIN CALCIUM 40 MG PO TABS: 40.0000 mg | ORAL_TABLET | Freq: Every day | ORAL | 1 refills | Status: AC

## 2024-04-27 NOTE — Patient Instructions (Addendum)
 Medication Instructions:   START TAKING ROSUVASTATIN  (CRESTOR ) 40 MG BY MOUTH DAILY--THIS IS IN ADDITION TO YOUR PRALUENT  AND ZETIA   *If you need a refill on your cardiac medications before your next appointment, please call your pharmacy*   Lab Work:  IN 3 MONTHS HERE AT [facility] 3RD FLOOR [address]---NMR LIPOPROFILE--PLEASE COME FASTING TO THIS LAB APPOINTMENT  If you have labs (blood work) drawn today and your tests are completely normal, you will receive your results only by: MyChart Message (if you have MyChart) OR A paper copy in the mail If you have any lab test that is abnormal or we need to change your treatment, we will call you to review the results.   Follow-Up:  AS PLANNED WITH DR. HOCHREIN IN APRIL 2026--YOU WILL BE CONTACTED CLOSER TO THAT TIME FRAME TO ARRANGE THAT APPOINTMENT

## 2024-04-29 NOTE — Progress Notes (Signed)
 LIPID CLINIC CONSULT NOTE  Chief Complaint:  Manage dyslipidemia  Primary Care Physician: Charlott Dorn LABOR, MD  Primary Cardiologist:  Lynwood Schilling, MD  HPI:  Dustin Rice is a 68 y.o. male who is being seen today for the evaluation of dyslipidemia at the request of Schilling Lynwood, MD. this is a pleasant 68 year old male, referred for evaluation management of dyslipidemia.  He is a patient of Dr. Schilling.  He was found to have a high calcium  score 1096, 93rd percentile for age and sex matched controls and aortic atherosclerosis in 2024.  This was followed up by a CT coronary angiogram.  This fortunately showed a lower calcium  score but high total plaque volume, most of which was noncalcified.  There were scattered mixed plaque but overall mild nonobstructive coronary disease.  Aggressive medical therapy was recommended.  He had been on ezetimibe but had intolerance to statins including rosuvastatin .  Subsequently was started on Praluent  but reported that his cholesterol seemed surprisingly to go up.  Total cholesterol in August was 182 with HDL 51, triglycerides 125 and LDL 109.  Prior to that his LDL was 75, however he did come off of the rosuvastatin  at the same time that he started on the Praluent  and I suspect this represents and that increase due to the different potency's of the medications.  He did have a prior LP(a) tested in March 2025 which was negative.  PMHx:  Past Medical History:  Diagnosis Date   Allergic rhinitis    Cervical spine disease    Esophageal reflux    GERD (gastroesophageal reflux disease)    Gilbert's syndrome    Hypercholesterolemia    Hyperlipidemia    Inguinal hernia    RIGHT   Prostatitis    RBBB    Shingles 03/2014    Past Surgical History:  Procedure Laterality Date   HERNIA REPAIR  2004    FAMHx:  Family History  Problem Relation Age of Onset   CAD Father 27   Heart failure Father    Hypertension Brother     SOCHx:    reports that he has never smoked. He has never used smokeless tobacco. He reports current alcohol use. He reports that he does not use drugs.  ALLERGIES:  Allergies  Allergen Reactions   Erythromycin Other (See Comments)    GI UPSET    ROS: Pertinent items noted in HPI and remainder of comprehensive ROS otherwise negative.  HOME MEDS: Current Outpatient Medications on File Prior to Visit  Medication Sig Dispense Refill   alfuzosin (UROXATRAL) 10 MG 24 hr tablet Take 10 mg by mouth once.     Alirocumab  (PRALUENT ) 75 MG/ML SOAJ Inject 1 mL (75 mg total) into the skin every 14 (fourteen) days. 6 mL 1   aspirin EC 81 MG tablet Take 81 mg by mouth daily. Swallow whole.     cetirizine (ZYRTEC) 10 MG tablet Take 10 mg by mouth daily.     Cholecalciferol (VITAMIN D-1000 MAX ST) 25 MCG (1000 UT) tablet      ezetimibe (ZETIA) 10 MG tablet Take 10 mg by mouth daily.     FLUTICASONE PROPIONATE EX Place 50 mcg into both nostrils.     hydrocortisone (ANUSOL-HC) 25 MG suppository Place 25 mg rectally daily. (Patient taking differently: Place 25 mg rectally as needed.)     Melatonin 5 MG TABS Take 5 mg by mouth at bedtime as needed (sleep).      meloxicam  (MOBIC ) 7.5 MG  tablet Take 1 tablet (7.5 mg total) by mouth daily as needed for up to 14 doses for pain. 60 tablet 2   silver  sulfADIAZINE  (SILVADENE ) 1 % cream Apply 1 Application topically daily. 50 g 3   tadalafil (CIALIS) 5 MG tablet Take 5 mg by mouth daily.     terbinafine  (LAMISIL ) 250 MG tablet Take 1 tablet (250 mg total) by mouth daily. 30 tablet 2   traMADol (ULTRAM) 50 MG tablet Take 50 mg by mouth every 6 (six) hours as needed.     triamcinolone  cream (KENALOG ) 0.1 % Apply 1 Application topically 2 (two) times daily. 80 g 3   doxycycline  (VIBRA -TABS) 100 MG tablet Take 1 tablet (100 mg total) by mouth 2 (two) times daily. (Patient not taking: Reported on 04/27/2024) 20 tablet 0   ibuprofen (ADVIL,MOTRIN) 200 MG tablet Take 200 mg by mouth  every 6 (six) hours as needed for mild pain (pain score 1-3). (Patient not taking: Reported on 04/27/2024)     neomycin -polymyxin-hydrocortisone (CORTISPORIN) OTIC solution Apply one to two drops to toe after soaking twice daily. (Patient not taking: Reported on 04/27/2024) 10 mL 0   No current facility-administered medications on file prior to visit.    LABS/IMAGING: No results found for this or any previous visit (from the past 48 hours). No results found.  LIPID PANEL:    Component Value Date/Time   CHOL 182 03/09/2024 0807   TRIG 125 03/09/2024 0807   HDL 51 03/09/2024 0807   CHOLHDL 3.6 03/09/2024 0807   LDLCALC 109 (H) 03/09/2024 0807    Lipoprotein (a)  Date/Time Value Ref Range Status  10/13/2023 08:55 AM 66.5 <75.0 nmol/L Final    Comment:    Note:  Values greater than or equal to 75.0 nmol/L may        indicate an independent risk factor for CHD,        but must be evaluated with caution when applied        to non-Caucasian populations due to the        influence of genetic factors on Lp(a) across        ethnicities.      WEIGHTS: Wt Readings from Last 3 Encounters:  04/27/24 183 lb (83 kg)  11/14/23 183 lb (83 kg)  07/15/23 190 lb 3.2 oz (86.3 kg)    VITALS: BP 125/80 (BP Location: Left Arm, Patient Position: Sitting, Cuff Size: Normal)   Pulse 88   Resp 17   Ht 5' 7 (1.702 m)   Wt 183 lb (83 kg)   SpO2 96%   BMI 28.66 kg/m   EXAM: Deferred  EKG: Deferred  ASSESSMENT: Dyslipidemia, goal LDL less than 55 Negative LP(a) Possible rosuvastatin  intolerance High CAC score greater than 1000 with nonobstructive coronary disease  PLAN: 1.   [name] has a dyslipidemia with target LDL less than 55.  Fortunately his LP(a) is negative.  He was started on the Praluent  but had to stop rosuvastatin .  The net effect is LDL at 105 but he needs further reduction.  He says that he may not have been that symptomatic with the statin and was willing to retry the  medication.  Since he had previously had a good response to this I think if we use it in combination to the Praluent  and the ezetimibe he will get a good response.  Restart rosuvastatin  40 mg daily.  If he remains above target we could consider increasing his Praluent  to 150 mg  every 2 weeks.  Plan repeat lipids in 3 to 4 months and he can follow-up with his primary cardiologist.  Vinie KYM Maxcy, MD, [facility], FNLA, FACP  [place]  [facility]  Medical Director of the [facility]  [facility] Diplomate of the American Board of Clinical Lipidology Attending Cardiologist  Direct Dial: [phone]  Fax: [phone]  Website:  [email or web address]  Vinie JAYSON Maxcy 04/29/2024, 3:38 PM

## 2024-04-30 ENCOUNTER — Encounter: Payer: Self-pay | Admitting: Podiatry

## 2024-04-30 NOTE — Telephone Encounter (Signed)
 Spoke to patient discussed after care instructions and healing process will continue to monitor.

## 2024-05-01 ENCOUNTER — Ambulatory Visit (INDEPENDENT_AMBULATORY_CARE_PROVIDER_SITE_OTHER): Admitting: Psychiatry

## 2024-05-01 DIAGNOSIS — F411 Generalized anxiety disorder: Secondary | ICD-10-CM | POA: Diagnosis not present

## 2024-05-01 NOTE — Progress Notes (Signed)
 [facility] Counselor/Therapist Progress Note  Patient ID: Dustin Rice, MRN: [id number],    Date: 05/01/2024  Time Spent: 53 minutes   Treatment Type: Individual Therapy  Reported Symptoms: anxiety, family concerns   Mental Status Exam:  Appearance:   Casual     Behavior:  Appropriate, Sharing, and Motivated  Motor:  Normal  Speech/Language:   Clear and Coherent  Affect:  anxious  Mood:  anxious  Thought process:  normal  Thought content:    WNL  Sensory/Perceptual disturbances:    WNL  Orientation:  oriented to person, place, time/date, situation, day of week, month of year, year, and stated date of Sept. 23, 2025  Attention:  Good  Concentration:  Fair  Memory:  WNL  Fund of knowledge:   Good  Insight:    Good  Judgment:   Good  Impulse Control:  Good   Risk Assessment: Danger to Self:  No Self-injurious Behavior: No Danger to Others: No Duty to Warn:no Physical Aggression / Violence:No  Access to Firearms a concern: No  Gang Involvement:No   Subjective:  Patient today reporting anxiety, some stress, and helping with elderly mother and some of his own physical issues and wife's recent back issues. Needing to get some support today as he is supporting his wife with her physical issues as well as his elderly mother. Today really needing session to talk through and process some issues with his young adult son, that are concerning to patient. (Not all details included in this note due to patient confidentiality.) Looking at and weighing in on certain changes within family and wanting to be supportive and not judgmental. Also needing today to share and look at some options for better managing stress, anxiety, frustrations, as he feels this does stress family relationships. An added stressor has occurred more recently re: his family and some of wife's family, which he processed more in session today and arrived at some ways of better managing sensitive situations within  the family.  Trying to diffuse situations better with family and beyond.  Concerns for son in [place]  but feels their close relationship is helping the situation.  Continues to oversee the decision making and care for his elderly mother who is cared for by family and caretakers primarily.  Despite a lot of stress and anxiety, patient showing good motivation and wanting to have a thoroughness in making decisions (which he feels would lead to less anxiety.)   Interventions: Cognitive Behavioral Therapy, Solution-Oriented/Positive Psychology, and Ego-Supportive Long term goal: Reduce overall level, frequency, and intensity of anxiety and depression so that daily functioning is not impaired. Short term goal: Increase understanding of beliefs and messages that produce worry and anxiety. Strategies: Identify and use specific coping strategies for anxiety and depression reduction.  Also explore alternative ways of communicating better with spouse and being more sensitive to needs within the family, including both wife and son.   Diagnosis:   ICD-10-CM   1. Generalized anxiety disorder  F41.1      Plan: Today continuing to work on his stress and managing it better, anxiety, as well as helping his wife with some medical issues and his elderly mother with her own physical issues and having the care that she needs.  Working further on how to best help his young adult son in certain situations, without being controlling, but being helpful and supportive in sons making good decisions.  Wants to be able to take off more time as  he ages as he notices that his anxiety decreases when he does take off time.  Trying to lessen his stress level by looking at certain things and situations in a different way.  Also working with strategies as noted in sessions to help him feel more leveled out, think through decisions and make best decisions possible seeking help when needed, and not overly stress over them. Encouraging  patient to keep working with self affirming behaviors as noted in sessions including working on these inside and outside of sessions: Being more committed to ongoing work with his treatment goals, more self-awareness and committed to personal health goals, stay in touch with supportive people, take breaks as needed, positive self talk and self-care, healthy nutrition and exercise, staying in the present focused on the things he can control, allow his faith to be an emotional support as well as spiritual, continue making his family a high priority including himself, and recognize the strength he shows when he works with goal-directed behaviors moving in a direction that supports his improved emotional health and outlook into the future.  Saqib Cazarez continues to make progress and recognizes his need to keep working with goal-directed behaviors to help him move and the more hopeful and healthier direction he desires into the future.  Goal review and progress/challenges noted with patient.  Next appointment within 3 weeks.   Barnie Bunde, LCSW

## 2024-05-03 ENCOUNTER — Ambulatory Visit: Admitting: Podiatry

## 2024-05-08 ENCOUNTER — Ambulatory Visit (INDEPENDENT_AMBULATORY_CARE_PROVIDER_SITE_OTHER)

## 2024-05-08 ENCOUNTER — Ambulatory Visit (INDEPENDENT_AMBULATORY_CARE_PROVIDER_SITE_OTHER): Admitting: Student

## 2024-05-08 DIAGNOSIS — M7581 Other shoulder lesions, right shoulder: Secondary | ICD-10-CM

## 2024-05-08 DIAGNOSIS — M25511 Pain in right shoulder: Secondary | ICD-10-CM

## 2024-05-08 MED ORDER — TRIAMCINOLONE ACETONIDE 40 MG/ML IJ SUSP
2.0000 mL | INTRAMUSCULAR | Status: AC | PRN
  Administered 2024-05-08: 2 mL via INTRA_ARTICULAR

## 2024-05-08 MED ORDER — LIDOCAINE HCL 1 % IJ SOLN
4.0000 mL | INTRAMUSCULAR | Status: AC | PRN
  Administered 2024-05-08: 4 mL

## 2024-05-08 NOTE — Progress Notes (Signed)
 Chief Complaint: Right shoulder pain    Discussed the use of AI scribe software for clinical note transcription with the patient, who gave verbal consent to proceed.  History of Present Illness Dustin Rice is a 68 year old male who presents with right shoulder pain. He experiences progressive right shoulder pain that began on Friday morning. The pain radiates down the side and sometimes up the back, worsening with pressure on a specific area of the shoulder. He denies any known injury but notes possible aggravation from lifting items during a [place].  He has a history of lower back issues and receives regular cortisone injections, avoiding heavy lifting to protect his back. He recently performed exercises like push downs and curls but did not work out last week. He suspects these activities might have contributed to the shoulder pain. The pain is more pronounced at night and is constant, though not severely debilitating. He takes ibuprofen for pain management. He is right-handed but uses his left hand for various tasks.   Surgical History:   None  PMH/PSH/Family History/Social History/Meds/Allergies:    Past Medical History:  Diagnosis Date   Allergic rhinitis    Cervical spine disease    Esophageal reflux    GERD (gastroesophageal reflux disease)    Gilbert's syndrome    Hypercholesterolemia    Hyperlipidemia    Inguinal hernia    RIGHT   Prostatitis    RBBB    Shingles 03/2014   Past Surgical History:  Procedure Laterality Date   HERNIA REPAIR  2004   Social History   Socioeconomic History   Marital status: Married    Spouse name: Not on file   Number of children: 1   Years of education: Not on file   Highest education level: Not on file  Occupational History   Not on file  Tobacco Use   Smoking status: Never   Smokeless tobacco: Never  Vaping Use   Vaping status: Never Used  Substance and Sexual Activity   Alcohol  use: Yes    Comment: occ   Drug use: No   Sexual activity: Not on file  Other Topics Concern   Not on file  Social History Narrative   Lives at home with wife.     Social Drivers of Corporate investment banker Strain: Low Risk  (04/27/2024)   Overall Financial Resource Strain (CARDIA)    Difficulty of Paying Living Expenses: Not hard at all  Food Insecurity: No Food Insecurity (04/27/2024)   Hunger Vital Sign    Worried About Running Out of Food in the Last Year: Never true    Ran Out of Food in the Last Year: Never true  Transportation Needs: No Transportation Needs (04/27/2024)   PRAPARE - Administrator, Civil Service (Medical): No    Lack of Transportation (Non-Medical): No  Physical Activity: Sufficiently Active (04/27/2024)   Exercise Vital Sign    Days of Exercise per Week: 3 days    Minutes of Exercise per Session: 60 min  Stress: No Stress Concern Present (04/27/2024)   [employer] of Occupational Health - Occupational Stress Questionnaire    Feeling of Stress: Only a little  Social Connections: Socially Integrated (04/27/2024)   Social Connection and Isolation Panel    Frequency of Communication with  Friends and Family: More than three times a week    Frequency of Social Gatherings with Friends and Family: More than three times a week    Attends Religious Services: More than 4 times per year    Active Member of [employer] or Organizations: Yes    Attends Engineer, structural: More than 4 times per year    Marital Status: Married   Family History  Problem Relation Age of Onset   CAD Father 49   Heart failure Father    Hypertension Brother    Allergies  Allergen Reactions   Erythromycin Other (See Comments)    GI UPSET   Current Outpatient Medications  Medication Sig Dispense Refill   alfuzosin (UROXATRAL) 10 MG 24 hr tablet Take 10 mg by mouth once.     Alirocumab  (PRALUENT ) 75 MG/ML SOAJ Inject 1 mL (75 mg total) into the skin every 14  (fourteen) days. 6 mL 1   aspirin EC 81 MG tablet Take 81 mg by mouth daily. Swallow whole.     cetirizine (ZYRTEC) 10 MG tablet Take 10 mg by mouth daily.     Cholecalciferol (VITAMIN D-1000 MAX ST) 25 MCG (1000 UT) tablet      doxycycline  (VIBRA -TABS) 100 MG tablet Take 1 tablet (100 mg total) by mouth 2 (two) times daily. (Patient not taking: Reported on 04/27/2024) 20 tablet 0   ezetimibe (ZETIA) 10 MG tablet Take 10 mg by mouth daily.     FLUTICASONE PROPIONATE EX Place 50 mcg into both nostrils.     hydrocortisone (ANUSOL-HC) 25 MG suppository Place 25 mg rectally daily. (Patient taking differently: Place 25 mg rectally as needed.)     ibuprofen (ADVIL,MOTRIN) 200 MG tablet Take 200 mg by mouth every 6 (six) hours as needed for mild pain (pain score 1-3). (Patient not taking: Reported on 04/27/2024)     Melatonin 5 MG TABS Take 5 mg by mouth at bedtime as needed (sleep).      meloxicam  (MOBIC ) 7.5 MG tablet Take 1 tablet (7.5 mg total) by mouth daily as needed for up to 14 doses for pain. 60 tablet 2   neomycin -polymyxin-hydrocortisone (CORTISPORIN) OTIC solution Apply one to two drops to toe after soaking twice daily. (Patient not taking: Reported on 04/27/2024) 10 mL 0   rosuvastatin  (CRESTOR ) 40 MG tablet Take 1 tablet (40 mg total) by mouth daily. 90 tablet 1   silver  sulfADIAZINE  (SILVADENE ) 1 % cream Apply 1 Application topically daily. 50 g 3   tadalafil (CIALIS) 5 MG tablet Take 5 mg by mouth daily.     terbinafine  (LAMISIL ) 250 MG tablet Take 1 tablet (250 mg total) by mouth daily. 30 tablet 2   traMADol (ULTRAM) 50 MG tablet Take 50 mg by mouth every 6 (six) hours as needed.     triamcinolone  cream (KENALOG ) 0.1 % Apply 1 Application topically 2 (two) times daily. 80 g 3   No current facility-administered medications for this visit.   No results found.  Review of Systems:   A ROS was performed including pertinent positives and negatives as documented in the HPI.  Physical Exam  :   Constitutional: NAD and appears stated age Neurological: Alert and oriented Psych: Appropriate affect and cooperative There were no vitals taken for this visit.   Comprehensive Musculoskeletal Exam:    Exam of the right shoulder demonstrates tenderness over the lateral deltoid and supraspinatus fossa.  Full active range of motion to 160 degrees flexion, 30 degrees active rotation,  and internal rotation to L4.  No deficits in strength with rotator cuff testing.  There is pain with empty can.  Imaging:   Xray (right shoulder 3 views): Negative for bony abnormality   I personally reviewed and interpreted the radiographs.      Assessment & Plan Right shoulder pain with suspected rotator cuff tendinitis Patient is experiencing acute right shoulder pain without specific injury although does note some recent increased activity.  His exam is reassuring as he does demonstrate good strength and full range of motion, although there is some pain with rotator cuff testing and tenderness over the supraspinatus distribution.  X-rays today appear negative.  Discussed treatment options including a subacromial cortisone injection which I believe would give him significant relief.  Patient is agreeable to proceed with this and injection was performed successfully without complication.  Will plan to have him follow-up if symptoms persist, worsen, or recur.      Procedure Note  Patient: Dustin Rice             Date of Birth: Nov 29, 1955           MRN: [id number]             Visit Date: 05/08/2024  Procedures: Visit Diagnoses:  1. Tendinitis of right rotator cuff     Large Joint Inj: R subacromial bursa on 05/08/2024 3:45 PM Indications: pain Details: 22 G 1.5 in needle, posterior approach Medications: 4 mL lidocaine  1 %; 2 mL triamcinolone  acetonide 40 MG/ML Outcome: tolerated well, no immediate complications Procedure, treatment alternatives, risks and benefits explained, specific risks  discussed. Consent was given by the patient. Immediately prior to procedure a time out was called to verify the correct patient, procedure, equipment, support staff and site/side marked as required. Patient was prepped and draped in the usual sterile fashion.       I personally saw and evaluated the patient, and participated in the management and treatment plan.  Leonce Reveal, PA-C Orthopedics

## 2024-05-10 ENCOUNTER — Ambulatory Visit (INDEPENDENT_AMBULATORY_CARE_PROVIDER_SITE_OTHER): Admitting: Podiatry

## 2024-05-10 ENCOUNTER — Ambulatory Visit: Admitting: Podiatry

## 2024-05-10 DIAGNOSIS — L600 Ingrowing nail: Secondary | ICD-10-CM

## 2024-05-10 DIAGNOSIS — B351 Tinea unguium: Secondary | ICD-10-CM

## 2024-05-11 NOTE — Progress Notes (Signed)
 He presents today for follow-up of his matrixectomy hallux right.  States that the left one is doing well.  He continues to take his terbinafine .  He states that he has about 3 days left and a refill present.  He states he is about to have a physical this month so he will have blood work done then.  Objective: Vital signs are stable alert and oriented x 3 hallux left nail plate does appear to be healing with Lamisil  therapy.  Also demonstrates well-healing surgical toe hallux right.  No reoccurrence of skin irritation or eczema.  Assessment: Well-healing surgical foot long-term therapy with Lamisil .  Plan: Make sure he has a blood work done for comprehensive metabolic panel in October.  He will complete his next bottle of Lamisil  and I will follow-up with him in 1 month

## 2024-05-13 ENCOUNTER — Other Ambulatory Visit: Payer: Self-pay | Admitting: Physician Assistant

## 2024-05-13 ENCOUNTER — Telehealth: Payer: Self-pay | Admitting: Physician Assistant

## 2024-05-13 MED ORDER — PREDNISONE 10 MG (21) PO TBPK: ORAL_TABLET | ORAL | 0 refills | Status: AC

## 2024-05-13 NOTE — Telephone Encounter (Signed)
 Patient saw Dustin Rice last week for his shoulder.  Had in injection which helped about 75 percent for about two days.  Pain has returned and has worsened.  I explained to him how the injection works and to give it two weeks, but he thinks he could also be having pain from his neck which has worsened.  He is taking nsaids and flfexeril without relief.  I told him to stop the nsaids and I would send in a steroid taper. Would like to come in to see someone if possible.  Can you see if anyone has an opening?

## 2024-05-14 ENCOUNTER — Ambulatory Visit (INDEPENDENT_AMBULATORY_CARE_PROVIDER_SITE_OTHER): Admitting: Student

## 2024-05-14 ENCOUNTER — Other Ambulatory Visit (HOSPITAL_BASED_OUTPATIENT_CLINIC_OR_DEPARTMENT_OTHER): Payer: Self-pay | Admitting: Student

## 2024-05-14 ENCOUNTER — Ambulatory Visit (INDEPENDENT_AMBULATORY_CARE_PROVIDER_SITE_OTHER)

## 2024-05-14 DIAGNOSIS — M542 Cervicalgia: Secondary | ICD-10-CM

## 2024-05-14 DIAGNOSIS — M5412 Radiculopathy, cervical region: Secondary | ICD-10-CM | POA: Diagnosis not present

## 2024-05-14 NOTE — Telephone Encounter (Signed)
 Called patient and scheduled for today with Leonce at 1:30pm.

## 2024-05-14 NOTE — Progress Notes (Signed)
 Chief Complaint: Neck and right shoulder pain    Discussed the use of AI scribe software for clinical note transcription with the patient, who gave verbal consent to proceed.  History of Present Illness  05/14/24: Patient presents today for evaluation of pain in his neck and right shoulder.  He reports that subacromial injection performed last week got him very good relief for a few days however this weekend the pain began to return.  Today the pain is worse on the right side of his neck and this has been most severe at night.  He is taking Tylenol and hydrocodone without much relief.  He was started on a prednisone  taper yesterday.  Does state that there is some occasional radiation down into the hand.   Dustin Rice is a 68 year old male who presents with right shoulder pain. He experiences progressive right shoulder pain that began on Friday morning. The pain radiates down the side and sometimes up the back, worsening with pressure on a specific area of the shoulder. He denies any known injury but notes possible aggravation from lifting items during a [place]. He has a history of lower back issues and receives regular cortisone injections, avoiding heavy lifting to protect his back. He recently performed exercises like push downs and curls but did not work out last week. He suspects these activities might have contributed to the shoulder pain. The pain is more pronounced at night and is constant, though not severely debilitating. He takes ibuprofen for pain management. He is right-handed but uses his left hand for various tasks.   Surgical History:   None  PMH/PSH/Family History/Social History/Meds/Allergies:    Past Medical History:  Diagnosis Date   Allergic rhinitis    Cervical spine disease    Esophageal reflux    GERD (gastroesophageal reflux disease)    Gilbert's syndrome    Hypercholesterolemia    Hyperlipidemia    Inguinal hernia     RIGHT   Prostatitis    RBBB    Shingles 03/2014   Past Surgical History:  Procedure Laterality Date   HERNIA REPAIR  2004   Social History   Socioeconomic History   Marital status: Married    Spouse name: Not on file   Number of children: 1   Years of education: Not on file   Highest education level: Not on file  Occupational History   Not on file  Tobacco Use   Smoking status: Never   Smokeless tobacco: Never  Vaping Use   Vaping status: Never Used  Substance and Sexual Activity   Alcohol use: Yes    Comment: occ   Drug use: No   Sexual activity: Not on file  Other Topics Concern   Not on file  Social History Narrative   Lives at home with wife.     Social Drivers of Corporate investment banker Strain: Low Risk  (04/27/2024)   Overall Financial Resource Strain (CARDIA)    Difficulty of Paying Living Expenses: Not hard at all  Food Insecurity: No Food Insecurity (04/27/2024)   Hunger Vital Sign    Worried About Running Out of Food in the Last Year: Never true    Ran Out of Food in the Last Year: Never true  Transportation Needs: No Transportation Needs (04/27/2024)  PRAPARE - Administrator, Civil Service (Medical): No    Lack of Transportation (Non-Medical): No  Physical Activity: Sufficiently Active (04/27/2024)   Exercise Vital Sign    Days of Exercise per Week: 3 days    Minutes of Exercise per Session: 60 min  Stress: No Stress Concern Present (04/27/2024)   [employer] of Occupational Health - Occupational Stress Questionnaire    Feeling of Stress: Only a little  Social Connections: Socially Integrated (04/27/2024)   Social Connection and Isolation Panel    Frequency of Communication with Friends and Family: More than three times a week    Frequency of Social Gatherings with Friends and Family: More than three times a week    Attends Religious Services: More than 4 times per year    Active Member of [employer] or Organizations: Yes    Attends  Engineer, structural: More than 4 times per year    Marital Status: Married   Family History  Problem Relation Age of Onset   CAD Father 13   Heart failure Father    Hypertension Brother    Allergies  Allergen Reactions   Erythromycin Other (See Comments)    GI UPSET   Current Outpatient Medications  Medication Sig Dispense Refill   predniSONE  (STERAPRED UNI-PAK 21 TAB) 10 MG (21) TBPK tablet Take as directed 21 tablet 0   alfuzosin (UROXATRAL) 10 MG 24 hr tablet Take 10 mg by mouth once.     Alirocumab  (PRALUENT ) 75 MG/ML SOAJ Inject 1 mL (75 mg total) into the skin every 14 (fourteen) days. 6 mL 1   aspirin EC 81 MG tablet Take 81 mg by mouth daily. Swallow whole.     cetirizine (ZYRTEC) 10 MG tablet Take 10 mg by mouth daily.     Cholecalciferol (VITAMIN D-1000 MAX ST) 25 MCG (1000 UT) tablet      ezetimibe (ZETIA) 10 MG tablet Take 10 mg by mouth daily.     FLUTICASONE PROPIONATE EX Place 50 mcg into both nostrils.     hydrocortisone (ANUSOL-HC) 25 MG suppository Place 25 mg rectally daily. (Patient taking differently: Place 25 mg rectally as needed.)     Melatonin 5 MG TABS Take 5 mg by mouth at bedtime as needed (sleep).      meloxicam  (MOBIC ) 15 MG tablet Take 15 mg by mouth daily as needed.     neomycin -polymyxin-hydrocortisone (CORTISPORIN) OTIC solution Apply one to two drops to toe after soaking twice daily. 10 mL 0   rosuvastatin  (CRESTOR ) 40 MG tablet Take 1 tablet (40 mg total) by mouth daily. 90 tablet 1   silver  sulfADIAZINE  (SILVADENE ) 1 % cream Apply 1 Application topically daily. 50 g 3   tadalafil (CIALIS) 5 MG tablet Take 5 mg by mouth daily.     terbinafine  (LAMISIL ) 250 MG tablet Take 1 tablet (250 mg total) by mouth daily. 30 tablet 2   traMADol (ULTRAM) 50 MG tablet Take 50 mg by mouth every 6 (six) hours as needed.     triamcinolone  cream (KENALOG ) 0.1 % Apply 1 Application topically 2 (two) times daily. 80 g 3   No current facility-administered  medications for this visit.   No results found.  Review of Systems:   A ROS was performed including pertinent positives and negatives as documented in the HPI.  Physical Exam :   Constitutional: NAD and appears stated age Neurological: Alert and oriented Psych: Appropriate affect and cooperative There were no vitals taken for  this visit.   Comprehensive Musculoskeletal Exam:    Tenderness in the right cervical paraspinal musculature.  Cervical range of motion is limited with rotation to the right and extension.  Positive Spurling's.  Tenderness of the right shoulder over the lateral deltoid.  No significant upper extremity weakness.  Imaging:   Xray (cervical spine 4 views): Loss of lordotic curvature.  Multilevel spondylosis with decreased disc spacing and anterior osteophyte formation.  There is sclerosis and most prominent degenerative findings at the C6-C7 level.  Anterolisthesis of C2 on C3 and C3 on C4.   I personally reviewed and interpreted the radiographs.      Assessment & Plan Cervical radiculopathy Patient presents back to clinic today with pain in the right side of the cervical spine as well as continued right shoulder pain.  X-rays taken today show diffuse multilevel degenerative changes most notable at C6-C7.  He reports a few days of relief with subacromial injection last week however relief has since worn off.  Given his presentation today symptoms appear more consistent with cervical radiculopathy which I believe is also contributing to his shoulder pain.  He gets occasional symptoms down into the right hand but this is infrequent.  Discussed treatment options and he has done well with physical therapy in the past so we will plan to proceed with this.  Should symptoms continue to persist or worsen, he could also follow-up with Dr. Colon to discuss potential injections as he gets these routinely for the lumbar spine.  Can plan follow-up as needed.      I  personally saw and evaluated the patient, and participated in the management and treatment plan.  Leonce Reveal, PA-C Orthopedics

## 2024-05-17 ENCOUNTER — Encounter (HOSPITAL_BASED_OUTPATIENT_CLINIC_OR_DEPARTMENT_OTHER): Payer: Self-pay

## 2024-05-21 ENCOUNTER — Other Ambulatory Visit: Payer: Self-pay

## 2024-05-21 ENCOUNTER — Ambulatory Visit (HOSPITAL_BASED_OUTPATIENT_CLINIC_OR_DEPARTMENT_OTHER): Attending: Student | Admitting: Physical Therapy

## 2024-05-21 DIAGNOSIS — M5412 Radiculopathy, cervical region: Secondary | ICD-10-CM | POA: Insufficient documentation

## 2024-05-21 DIAGNOSIS — M5459 Other low back pain: Secondary | ICD-10-CM | POA: Diagnosis present

## 2024-05-21 DIAGNOSIS — M6281 Muscle weakness (generalized): Secondary | ICD-10-CM | POA: Diagnosis present

## 2024-05-21 DIAGNOSIS — R29898 Other symptoms and signs involving the musculoskeletal system: Secondary | ICD-10-CM | POA: Diagnosis present

## 2024-05-21 DIAGNOSIS — M62838 Other muscle spasm: Secondary | ICD-10-CM | POA: Insufficient documentation

## 2024-05-21 NOTE — Therapy (Signed)
 OUTPATIENT PHYSICAL THERAPY CERVICAL EVALUATION   Patient Name: Dustin Rice MRN: [id number] DOB:1955-09-28, 68 y.o., male Today's Date: 05/22/2024  END OF SESSION:  PT End of Session - 05/21/24 1158     Visit Number 1    Number of Visits 12    Date for Recertification  07/02/24    Authorization Type MCR A&B    PT Start Time 1155    PT Stop Time 1248    PT Time Calculation (min) 53 min    Activity Tolerance Patient tolerated treatment well    Behavior During Therapy [facility] for tasks assessed/performed          Past Medical History:  Diagnosis Date   Allergic rhinitis    Cervical spine disease    Esophageal reflux    GERD (gastroesophageal reflux disease)    Gilbert's syndrome    Hypercholesterolemia    Hyperlipidemia    Inguinal hernia    RIGHT   Prostatitis    RBBB    Shingles 03/2014   Past Surgical History:  Procedure Laterality Date   HERNIA REPAIR  2004   Patient Active Problem List   Diagnosis Date Noted   Benign localized prostatic hyperplasia with lower urinary tract symptoms (LUTS) 04/05/2024   Chronic pain 04/05/2024   Epigastric pain 04/05/2024   Gastroesophageal reflux disease 04/05/2024   Hematochezia 04/05/2024   Hypercholesterolemia 04/05/2024   Internal hemorrhoids 04/05/2024   Intestinal malabsorption 04/05/2024   Lumbago with sciatica, left side 04/05/2024   Screen for colon cancer 04/05/2024   RBBB 07/14/2023   Dyslipidemia 07/14/2023   Elevated coronary artery calcium  score 07/14/2023   Spinal stenosis of lumbar region 05/13/2023   Spondylolisthesis at L4-L5 level 05/13/2023   Palpitations 05/12/2016     REFERRING PROVIDER: Emiliano Leonce CROME, PA-C  REFERRING DIAG: M54.12 (ICD-10-CM) - Radiculopathy, cervical region  THERAPY DIAG:  Radiculopathy, cervical region  Other muscle spasm  Other signs and symptoms involving the musculoskeletal system  Rationale for Evaluation and Treatment: Rehabilitation  ONSET DATE: PT  order 05/14/24  SUBJECTIVE:                                                                                                                                                                                                         SUBJECTIVE STATEMENT: Pt reports neck pain began 25-30 years ago.  He gave up golf due to neck pain.  He didn't have neck pain for awhile.  Pt reports not sure why his pain worsened in 2018.  He went to [facility] in 2018  and received a shot.  He also received PT in 2018.  Pt reports improved sx's with injection and PT.   Pt c/o's of R sided neck and shoulder pain, numbness in R UE along radial side of forearm, and some spasms in his R shoulder.     Pt had a subacromial injection which helped for a few days though the pain began to return.  He also took prednisone  which helped more in the beginning.  Pt states his pain slowly returned after the prednisone .     PA note indicated x rays showing:  loss of lordotic curvature.  Multilevel spondylosis with decreased disc spacing and anterior osteophyte formation.  There is sclerosis and most prominent degenerative findings at the C6-C7 level.  Anterolisthesis of C2 on C3 and C3 on C4.  Pt reports he is sleeping in the recliner for 5 days.  His pain is worst at night and he has disturbed sleep.  He has pain at rest.        Hand dominance:  R hand dominant, though writes and eats with L hand  PERTINENT HISTORY:  Cervical anterolisthesis  Hx of chronic lumbar pain--spinal stenosis and Grade 1 anterolisthesis L3-L4 and L4-5.  He receives injections.   PAIN:  NPRS:  2-3/10 current, 9-10/10 worst, 1/10 best Location:  R sided cervical and shoulder Easing Factors: massaging/putting pressure on R UT, alternating tylenol and Ibuprofen, hot shower,  reaching Aggravating Factors:  at rest, tilting head down to look at phone, looking up  PRECAUTIONS: Other: cervical and lumbar anterolisthesis     WEIGHT BEARING  RESTRICTIONS: No  FALLS:  Has patient fallen in last 6 months? 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   PLOF: Independent  PATIENT GOALS: decrease pain, return to PLOF, avoid surgery, improve posture   OBJECTIVE:  Note: Objective measures were completed at Evaluation unless otherwise noted.  DIAGNOSTIC FINDINGS:  Cervical X rays: BONES: Diffuse cervical kyphosis. Fixed 3 mm anterolisthesis C2-C3, likely degenerative in nature. Incomplete segmentation C5-6 with rudimentary disc space.   DISCS AND DEGENERATIVE CHANGES: Disc space narrowing and endplate remodeling of C4-5 and C6-7 in keeping with changes of advanced degenerative disc disease. Moderate degenerative changes seen at C3-4. Asymmetric left facet arthrosis noted at C3-C4.   SOFT TISSUES: No prevertebral soft tissue swelling.   IMPRESSION: 1. Fixed 3 mm anterolisthesis C2-3, likely degenerative in nature. No abnormal motion with flexion and extension to suggest ligamentous instability. 2. Incomplete segmentation C5-6 with rudimentary disc space. 3. Disc space narrowing and endplate remodeling of C4-5 and C6-7, consistent with advanced degenerative disc disease. 4. Asymmetric left facet arthrosis at C3-C4, not well visualized. 5. Mild congenital narrowing of the spinal canal.  Shoulder x ray: FINDINGS: There is no evidence of fracture or dislocation. Mild acromioclavicular spurring. No erosive change or evidence of focal bone abnormality. Soft tissues are unremarkable.   IMPRESSION: Mild acromioclavicular spurring.  PATIENT SURVEYS:  NDI:  NECK DISABILITY INDEX  Date: 05/21/24 Score  Pain intensity 2 = The pain is moderate at the moment  2. Personal care (washing, dressing, etc.) 1 =   I can look after myself normally but it causes extra pain  3. Lifting 4 =  I can only lift very light weights  4. Reading 0 =  I can read as much as I want to with no pain in my neck  5. Headaches 4 = I have severe headaches, which come frequently   6. Concentration 2 = I have a fair degree of difficulty in concentrating when I want to  7. Work 3 =  I cannot do my usual work  8. Driving 2 =  I can drive my car as long as I want with moderate pain in my neck  9. Sleeping 1 = My sleep is slightly disturbed (less than 1 hr sleepless)  10. Recreation 1 =  I am able to engage in all my recreation activities, with some pain in my neck  Total 20/50   Minimum Detectable Change (90% confidence): 5 points or 10% points  COGNITION: Overall cognitive status: Within functional limits for tasks assessed  SENSATION: Check next visit  POSTURE: forward head  PALPATION: Pt felts spasms in R UE with palpation of trigger point in R UT.  Pt had tenderness with palpation of R UT > L UT.  He has soft tissue tightness in R > L UT.  Pt had tenderness in R sided cervical paraspinals.    CERVICAL ROM:   Active ROM A/PROM (deg) eval  Flexion 28 with pain  Extension   Right lateral flexion 30 with a little pain  Left lateral flexion 27  Right rotation 54 with a little pain  Left rotation 53   (Blank rows = not tested)  UPPER EXTREMITY ROM:  Active ROM Right eval Left eval  Shoulder flexion [facility] WFL and greater ROM on L  Shoulder extension    Shoulder abduction [facility] WFL and greater ROM on L  Shoulder adduction    Shoulder extension    Shoulder internal rotation    Shoulder external rotation    Elbow flexion    Elbow extension    Wrist flexion    Wrist extension    Wrist ulnar deviation    Wrist radial deviation    Wrist pronation    Wrist supination     (Blank rows = not tested)  UPPER EXTREMITY MMT:  MMT Right eval Left eval  Shoulder flexion 5/5 5/5  Shoulder extension    Shoulder  abduction 5/5 5/5  Shoulder adduction    Shoulder extension    Shoulder internal rotation    Shoulder external rotation    Middle trapezius    Lower trapezius    Elbow flexion 5/5 5/5  Elbow extension 5/5 4+/5  Wrist flexion intact   Wrist extension intact   Wrist ulnar deviation    Wrist radial deviation    Wrist pronation    Wrist supination    Grip strength     (Blank rows = not tested)    TREATMENT:  Pt performed shoulder rolls and scapular retraction with a 3 sec hold x 10 reps.  Pt received a HEP handout and was educated in correct form and appropriate frequency. Pt has a theracane at home.  PT educated pt with using the theracane and demonstrated using the theracane.  PATIENT EDUCATION:  Education details: dx, objective findings, HEP, rationale of interventions, relevant anatomy, POC, and prognosis. Person educated: Patient Education method: Explanation, Demonstration, Tactile cues, Verbal cues, and Handouts Education comprehension: verbalized understanding, returned demonstration, verbal cues required, tactile cues required, and needs further education  HOME EXERCISE PROGRAM: Access Code: [email or web address] URL: [email or web address] Date: 05/21/2024 Prepared by: Mose Minerva  Exercises - Standing Backward Shoulder Rolls  - 2-3 x daily - 7 x weekly - 2 sets - 10 reps - Standing Scapular Retraction  - 2 x daily - 7 x weekly - 2 sets - 10 reps - 3 second  hold  ASSESSMENT:  CLINICAL IMPRESSION: Patient is a 68 y.o. male with a dx of cervical radiculopathy presenting to the clinic with R sided cervical and shoulder pain, numbness in R UE, trigger points in UT, and limited cervical ROM.  Pt has a hx of cervical pain.  Pt had an exacerbation in 2018 and responded well to an injection and PT.  Pt received a shot and prednisone .  He states  they helped but the pain returned.  He reports pain with looking up and down.  He has pain at rest and his worst pain is at night.  Pt should benefit from skilled PT to address impairments and improve overall function.          OBJECTIVE IMPAIRMENTS: decreased ROM, decreased strength, hypomobility, impaired flexibility, and pain.   ACTIVITY LIMITATIONS: sleeping  PARTICIPATION LIMITATIONS:   PERSONAL FACTORS: Time since onset of injury/illness/exacerbation and 1 comorbidity: chronic lumbar pain are also affecting patient's functional outcome.   REHAB POTENTIAL: Good  CLINICAL DECISION MAKING: Stable/uncomplicated  EVALUATION COMPLEXITY: Low   GOALS:  SHORT TERM GOALS: Target date:  06/11/2024   Pt will be independent and compliant with HEP for improved pain, sx's, ROM, postural strength, and function. Baseline:  Goal status: INITIAL  2.  Pt will report at least a 25% improvement in pain and sx's overall.  Baseline:  Goal status: INITIAL  3.  Pt will demo improved cervical rotation AROM to be at least 60 deg bilat and Sb'ing to at least 35 deg bilat for improved stiffness and mobility.   Baseline:  Goal status: INITIAL    LONG TERM GOALS: Target date: 07/02/2024  Pt will report at least a 70% improvement in pain and sx's overall.  Baseline:  Goal status: INITIAL  2.  Pt's NDI will improve by at 5 points for a clinically significant improvement in self perceived disability and function.   Baseline:  Goal status: INITIAL  3.  Pt will sleep at least 5/7 nights per week without disturbed sleep from pain and/or radicular sx's.  Baseline:  Goal status: INITIAL  4.  Pt will demo improved soft tissue tightness in cervical paraspinals and UT including reduced trigger points in UT for reduced muscle tension and decreased pain. Baseline:  Goal status: INITIAL     PLAN:  PT FREQUENCY: 1-2x/week  PT DURATION: 6 weeks  PLANNED INTERVENTIONS: [id number]- PT Re-evaluation,  97750- Physical Performance Testing, 97110-Therapeutic exercises, 97530- Therapeutic activity, [id number]- Neuromuscular re-education, 97535- Self Care, [id number]- Manual therapy, [id number]- Aquatic Therapy, [id number]- Electrical stimulation (unattended), [id number]- Electrical stimulation (manual), [id number]- Ultrasound, [id number] (  1-2 muscles), 20561 (3+ muscles)- Dry Needling, Patient/Family education, Taping, Joint mobilization, Cryotherapy, and Moist heat  PLAN FOR NEXT SESSION:  Check sensation to LT next visit.  Postural stabilization and strengthening.  Cervical rotation AROM w/n pt tolerance.  STM and TPR to UT and cervical paraspinals.   Leigh Minerva III PT, DPT 05/22/24 10:09 PM

## 2024-05-22 ENCOUNTER — Encounter (HOSPITAL_BASED_OUTPATIENT_CLINIC_OR_DEPARTMENT_OTHER): Payer: Self-pay | Admitting: Physical Therapy

## 2024-05-23 ENCOUNTER — Ambulatory Visit: Admitting: Psychiatry

## 2024-05-23 DIAGNOSIS — F411 Generalized anxiety disorder: Secondary | ICD-10-CM

## 2024-05-23 NOTE — Progress Notes (Signed)
 [facility] Counselor/Therapist Progress Note  Patient ID: Dustin Rice, MRN: [id number],    Date: 05/23/2024  Time Spent: 53 minutes   Treatment Type: Individual Therapy  Reported Symptoms: anxiety, family concerns     Mental Status Exam:  Appearance:   Casual     Behavior:  Appropriate, Sharing, and Motivated  Motor:  Normal  Speech/Language:   Clear and Coherent  Affect:  anxious  Mood:  anxious  Thought process:  goal directed  Thought content:    WNL  Sensory/Perceptual disturbances:    WNL  Orientation:  oriented to person, place, time/date, situation, day of week, month of year, year, and stated date of Oct. 15, 2025  Attention:  Fair  Concentration:  Fair  Memory:  WNL  Fund of knowledge:   Good  Insight:    Good and Fair  Judgment:   Good  Impulse Control:  Good   Risk Assessment: Danger to Self:  No Self-injurious Behavior: No Danger to Others: No Duty to Warn:no Physical Aggression / Violence:No  Access to Firearms a concern: No  Gang Involvement:No   Subjective:  Patient reporting symptoms of stress, anxiety, and supporting elderly mother. Some physical issues (bone spur and some degenerative disc) himself and his wife having physical issues also. Continues to support wife with her issues and his elderly mother who lives nearby and has round the clock care. Processing today more of his concerns about wife's health, adult son, and elderly mother. Working on concerns re: young adult son and choices in life. Wife's diabetes is a concern as she continues to have fluctuations and     Today really needing session to talk through and process some issues with his young adult son, that are concerning to patient. (Not all details included in this note due to patient confidentiality.)   at and weighing in on certain changes within family and wanting to be supportive and not judgmental. Also needing today to share and look at some options for better managing  stress, anxiety, frustrations, as he feels this does stress family relationships. An added stressor has occurred more recently re: his family and some of wife's family, which he processed more in session today and arrived at some ways of better managing sensitive situations within the family.  Trying to diffuse situations better with family and beyond.  Concerns for son in [place]  but feels their close relationship is helping the situation.  Continues to oversee the decision making and care for his elderly mother who is cared for by family and caretakers primarily.  Despite a lot of stress and anxiety, patient showing good motivation and wanting to have a thoroughness in making decisions (which he feels would lead to less anxiety.)     Interventions: Cognitive Behavioral Therapy, Solution-Oriented/Positive Psychology, and Ego-Supportive Long term goal: Reduce overall level, frequency, and intensity of anxiety and depression so that daily functioning is not impaired. Short term goal: Increase understanding of beliefs and messages that produce worry and anxiety. Strategies: Identify and use specific coping strategies for anxiety and depression reduction.  Also explore alternative ways of communicating better with spouse and being more sensitive to needs within the family, including both wife and son.    Diagnosis:   ICD-10-CM   1. Generalized anxiety disorder  F41.1      Plan: Patient working further today trying to better manage his stress, talking through a lot of concern for his wife who continues to have pain and  medical issues, his adult son living in [place] , and his elderly mother with significant physical issues.  Trying to be supportive of adult son and also urged him and making good decisions as discussed in session today.  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  Continues working to reduce  overall level, frequency, and intensity of his anxiety and some depression so that his daily functioning remains quite good considering his various stressors.  Looking at what he can change versus what he cannot change and accepting the things he cannot change.  Some concerns regarding his young adult son and trying to be a positive influence with him without telling him what to do or not do.  Does find that when he takes off time from working that his anxiety tends to be decreased.  Also continuing to practice looking at certain things in a different way as he has found in her talking that that makes a difference in a positive way.  Continue to encourage patient to keep working with his self affirming behaviors as noted in sessions including working on these inside and outside of sessions: Being more committed to ongoing work with his treatment goals, more self-awareness and committed to personal health goals, stay in touch with supportive people, take breaks as needed, positive self-talk and self-care, healthy nutrition and exercise, stay on the present focused on the things he can control, allow his faith to be an emotional support as well as spiritual, continue making his family a high priority including himself, and recognize the strength he shows when he works with goal-directed behaviors to move in a direction that supports his overall emotional health and his outlook going forward.  [facility] recognizes his progress made and needs to continue working with goal-directed behaviors to help him keep moving in a more hopeful and healthier direction in the future.  Goal review and progress/challenges noted with patient.  Next appointment within 3 weeks.   Barnie Bunde, LCSW

## 2024-05-24 ENCOUNTER — Ambulatory Visit (HOSPITAL_BASED_OUTPATIENT_CLINIC_OR_DEPARTMENT_OTHER): Admitting: Physical Therapy

## 2024-05-24 ENCOUNTER — Other Ambulatory Visit (HOSPITAL_BASED_OUTPATIENT_CLINIC_OR_DEPARTMENT_OTHER): Payer: Self-pay

## 2024-05-24 DIAGNOSIS — M5412 Radiculopathy, cervical region: Secondary | ICD-10-CM | POA: Diagnosis not present

## 2024-05-24 DIAGNOSIS — M62838 Other muscle spasm: Secondary | ICD-10-CM

## 2024-05-24 DIAGNOSIS — M6281 Muscle weakness (generalized): Secondary | ICD-10-CM

## 2024-05-24 DIAGNOSIS — R29898 Other symptoms and signs involving the musculoskeletal system: Secondary | ICD-10-CM

## 2024-05-24 DIAGNOSIS — M5459 Other low back pain: Secondary | ICD-10-CM

## 2024-05-24 NOTE — Therapy (Signed)
 OUTPATIENT PHYSICAL THERAPY CERVICAL EVALUATION   Patient Name: Dustin Rice MRN: [id number] DOB:August 20, 1955, 68 y.o., male Today's Date: 05/25/2024  END OF SESSION:  PT End of Session - 05/25/24 1041     Visit Number 2    Number of Visits 12    Date for Recertification  07/02/24    Authorization Type MCR A&B    PT Start Time 0800    PT Stop Time 0843    PT Time Calculation (min) 43 min    Activity Tolerance Patient tolerated treatment well    Behavior During Therapy [facility] for tasks assessed/performed           Past Medical History:  Diagnosis Date   Allergic rhinitis    Cervical spine disease    Esophageal reflux    GERD (gastroesophageal reflux disease)    Gilbert's syndrome    Hypercholesterolemia    Hyperlipidemia    Inguinal hernia    RIGHT   Prostatitis    RBBB    Shingles 03/2014   Past Surgical History:  Procedure Laterality Date   HERNIA REPAIR  2004   Patient Active Problem List   Diagnosis Date Noted   Benign localized prostatic hyperplasia with lower urinary tract symptoms (LUTS) 04/05/2024   Chronic pain 04/05/2024   Epigastric pain 04/05/2024   Gastroesophageal reflux disease 04/05/2024   Hematochezia 04/05/2024   Hypercholesterolemia 04/05/2024   Internal hemorrhoids 04/05/2024   Intestinal malabsorption 04/05/2024   Lumbago with sciatica, left side 04/05/2024   Screen for colon cancer 04/05/2024   RBBB 07/14/2023   Dyslipidemia 07/14/2023   Elevated coronary artery calcium  score 07/14/2023   Spinal stenosis of lumbar region 05/13/2023   Spondylolisthesis at L4-L5 level 05/13/2023   Palpitations 05/12/2016     REFERRING PROVIDER: Emiliano Leonce CROME, PA-C  REFERRING DIAG: M54.12 (ICD-10-CM) - Radiculopathy, cervical region  THERAPY DIAG:  Radiculopathy, cervical region  Other muscle spasm  Other symptoms and signs involving the musculoskeletal system  Other low back pain  Muscle weakness (generalized)  Other signs and  symptoms involving the musculoskeletal system  Rationale for Evaluation and Treatment: Rehabilitation  ONSET DATE: PT order 05/14/24  SUBJECTIVE:                                                                                                                                                                                                         SUBJECTIVE STATEMENT: The patient feels it mostly in his neck. He feels like it may be a little better. He can feel the tingling with scap retraction.  Eval: Pt reports neck pain began 25-30 years ago.  He gave up golf due to neck pain.  He didn't have neck pain for awhile.  Pt reports not sure why his pain worsened in 2018.  He went to [facility] in 2018 and received a shot.  He also received PT in 2018.  Pt reports improved sx's with injection and PT.   Pt c/o's of R sided neck and shoulder pain, numbness in R UE along radial side of forearm, and some spasms in his R shoulder.     Pt had a subacromial injection which helped for a few days though the pain began to return.  He also took prednisone  which helped more in the beginning.  Pt states his pain slowly returned after the prednisone .     PA note indicated x rays showing:  loss of lordotic curvature.  Multilevel spondylosis with decreased disc spacing and anterior osteophyte formation.  There is sclerosis and most prominent degenerative findings at the C6-C7 level.  Anterolisthesis of C2 on C3 and C3 on C4.  Pt reports he is sleeping in the recliner for 5 days.  His pain is worst at night and he has disturbed sleep.  He has pain at rest.        Hand dominance:  R hand dominant, though writes and eats with L hand  PERTINENT HISTORY:  Cervical anterolisthesis  Hx of chronic lumbar pain--spinal stenosis and Grade 1 anterolisthesis L3-L4 and L4-5.  He receives injections.   PAIN:  NPRS:  2-3/10 current, 9-10/10 worst, 1/10 best Location:  R sided cervical and shoulder Easing Factors:  massaging/putting pressure on R UT, alternating tylenol and Ibuprofen, hot shower,  reaching Aggravating Factors:  at rest, tilting head down to look at phone, looking up  PRECAUTIONS: Other: cervical and lumbar anterolisthesis     WEIGHT BEARING RESTRICTIONS: No  FALLS:  Has patient fallen in last 6 months? 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   PLOF: Independent  PATIENT GOALS: decrease pain, return to PLOF, avoid surgery, improve posture   OBJECTIVE:  Note: Objective measures were completed at Evaluation unless otherwise noted.  DIAGNOSTIC FINDINGS:  Cervical X rays: BONES: Diffuse cervical kyphosis. Fixed 3 mm anterolisthesis C2-C3, likely degenerative in nature. Incomplete segmentation C5-6 with rudimentary disc space.   DISCS AND DEGENERATIVE CHANGES: Disc space narrowing and endplate remodeling of C4-5 and C6-7 in keeping with changes of advanced degenerative disc disease. Moderate degenerative changes seen at C3-4. Asymmetric left facet arthrosis noted at C3-C4.   SOFT TISSUES: No prevertebral soft tissue swelling.   IMPRESSION: 1. Fixed 3 mm anterolisthesis C2-3, likely degenerative in nature. No abnormal motion with flexion and extension to suggest ligamentous instability. 2. Incomplete segmentation C5-6 with rudimentary disc space. 3. Disc space narrowing and endplate remodeling of C4-5 and C6-7, consistent with advanced degenerative disc disease. 4. Asymmetric left facet arthrosis at C3-C4, not well visualized. 5. Mild congenital narrowing of the spinal canal.  Shoulder x ray: FINDINGS: There is no evidence of fracture or dislocation. Mild acromioclavicular spurring. No erosive change or evidence of focal bone abnormality. Soft  tissues are unremarkable.   IMPRESSION: Mild acromioclavicular spurring.  PATIENT SURVEYS:  NDI:  NECK DISABILITY INDEX  Date: 05/21/24 Score  Pain intensity 2 =  The pain is moderate at the moment  2. Personal care (washing, dressing, etc.) 1 =  I can look after myself normally but it causes extra pain  3. Lifting 4 =  I can only lift very light weights  4. Reading 0 = I can read as much as I want to with no pain in my neck  5. Headaches 4 = I have severe headaches, which come frequently   6. Concentration 2 = I have a fair degree of difficulty in concentrating when I want to  7. Work 3 =  I cannot do my usual work  8. Driving 2 =  I can drive my car as long as I want with moderate pain in my neck  9. Sleeping 1 = My sleep is slightly disturbed (less than 1 hr sleepless)  10. Recreation 1 =  I am able to engage in all my recreation activities, with some pain in my neck  Total 20/50   Minimum Detectable Change (90% confidence): 5 points or 10% points  COGNITION: Overall cognitive status: Within functional limits for tasks assessed  SENSATION: Check next visit  POSTURE: forward head  PALPATION: Pt felts spasms in R UE with palpation of trigger point in R UT.  Pt had tenderness with palpation of R UT > L UT.  He has soft tissue tightness in R > L UT.  Pt had tenderness in R sided cervical paraspinals.    CERVICAL ROM:   Active ROM A/PROM (deg) eval  Flexion 28 with pain  Extension   Right lateral flexion 30 with a little pain  Left lateral flexion 27  Right rotation 54 with a little pain  Left rotation 53   (Blank rows = not tested)  UPPER EXTREMITY ROM:  Active ROM Right eval Left eval  Shoulder flexion [facility] WFL and greater ROM on L  Shoulder extension    Shoulder abduction [facility] WFL and greater ROM on L  Shoulder adduction    Shoulder extension    Shoulder internal rotation    Shoulder external rotation    Elbow flexion    Elbow extension    Wrist flexion     Wrist extension    Wrist ulnar deviation    Wrist radial deviation    Wrist pronation    Wrist supination     (Blank rows = not tested)  UPPER EXTREMITY MMT:  MMT Right eval Left eval  Shoulder flexion 5/5 5/5  Shoulder extension    Shoulder abduction 5/5 5/5  Shoulder adduction    Shoulder extension    Shoulder internal rotation    Shoulder external rotation    Middle trapezius    Lower trapezius    Elbow flexion 5/5 5/5  Elbow extension 5/5 4+/5  Wrist flexion intact   Wrist extension intact   Wrist ulnar deviation    Wrist radial deviation    Wrist pronation    Wrist supination    Grip strength     (Blank rows = not tested)    TREATMENT:  Manual:  Trigger point release to upper trap and cervical paraspinals   There-ex: upper trap stretch   Neuro-re-ed:  Bilateral ER yellow 2x8  Scap retraction 2x10 red  Shoulder extension 2x10 red   Eval: Pt performed shoulder rolls and scapular retraction with a 3 sec hold x 10 reps.  Pt received a HEP handout and was educated in correct form and appropriate frequency. Pt has a theracane at home.  PT educated pt with using the theracane and demonstrated using the theracane.  PATIENT EDUCATION:  Education details: dx, objective findings, HEP, rationale of interventions, relevant anatomy, POC, and prognosis. Person educated: Patient Education method: Explanation, Demonstration, Tactile cues, Verbal cues, and Handouts Education comprehension: verbalized understanding, returned demonstration, verbal cues required, tactile cues required, and needs further education  HOME EXERCISE PROGRAM: Access Code: [email or web address] URL: [email or web address] Date: 05/21/2024 Prepared by: Mose Minerva  Exercises - Standing Backward Shoulder Rolls  - 2-3 x daily - 7 x weekly - 2 sets - 10 reps - Standing  Scapular Retraction  - 2 x daily - 7 x weekly - 2 sets - 10 reps - 3 second  hold  ASSESSMENT:  CLINICAL IMPRESSION: The patient had mild spasming in his upper trap and cervical paraspinals. He felt increased tingling down his arm following manual therapy. We reviewed head turns in pain free range. We also reviewed his retractions. He reported they were causing some tingling. We had him perform them to neutral with light resistance. He was given a red band for home. Therapy will continue to progress as tolerated. We updated his HEP.   Eval: Patient is a 68 y.o. male with a dx of cervical radiculopathy presenting to the clinic with R sided cervical and shoulder pain, numbness in R UE, trigger points in UT, and limited cervical ROM.  Pt has a hx of cervical pain.  Pt had an exacerbation in 2018 and responded well to an injection and PT.  Pt received a shot and prednisone .  He states they helped but the pain returned.  He reports pain with looking up and down.  He has pain at rest and his worst pain is at night.  Pt should benefit from skilled PT to address impairments and improve overall function.          OBJECTIVE IMPAIRMENTS: decreased ROM, decreased strength, hypomobility, impaired flexibility, and pain.   ACTIVITY LIMITATIONS: sleeping  PARTICIPATION LIMITATIONS:   PERSONAL FACTORS: Time since onset of injury/illness/exacerbation and 1 comorbidity: chronic lumbar pain are also affecting patient's functional outcome.   REHAB POTENTIAL: Good  CLINICAL DECISION MAKING: Stable/uncomplicated  EVALUATION COMPLEXITY: Low   GOALS:  SHORT TERM GOALS: Target date:  06/11/2024   Pt will be independent and compliant with HEP for improved pain, sx's, ROM, postural strength, and function. Baseline:  Goal status: INITIAL  2.  Pt will report at least a 25% improvement in pain and sx's overall.  Baseline:  Goal status: INITIAL  3.  Pt will demo improved cervical rotation AROM to be at least  60 deg bilat and Sb'ing to at least 35 deg bilat for improved stiffness and mobility.   Baseline:  Goal status: INITIAL    LONG TERM GOALS: Target date: 07/02/2024  Pt will report at least a 70% improvement in pain and sx's overall.  Baseline:  Goal status: INITIAL  2.  Pt's NDI will improve by at 5 points for a clinically significant improvement in self perceived disability and function.   Baseline:  Goal status:  INITIAL  3.  Pt will sleep at least 5/7 nights per week without disturbed sleep from pain and/or radicular sx's.  Baseline:  Goal status: INITIAL  4.  Pt will demo improved soft tissue tightness in cervical paraspinals and UT including reduced trigger points in UT for reduced muscle tension and decreased pain. Baseline:  Goal status: INITIAL     PLAN:  PT FREQUENCY: 1-2x/week  PT DURATION: 6 weeks  PLANNED INTERVENTIONS: [id number]- PT Re-evaluation, 97750- Physical Performance Testing, 97110-Therapeutic exercises, 97530- Therapeutic activity, [id number]- Neuromuscular re-education, 97535- Self Care, [id number]- Manual therapy, [id number]- Aquatic Therapy, [id number]- Electrical stimulation (unattended), [id number]- Electrical stimulation (manual), [id number]- Ultrasound, [id number] (1-2 muscles), [id number] (3+ muscles)- Dry Needling, Patient/Family education, Taping, Joint mobilization, Cryotherapy, and Moist heat  PLAN FOR NEXT SESSION:  Check sensation to LT next visit.  Postural stabilization and strengthening.  Cervical rotation AROM w/n pt tolerance.  STM and TPR to UT and cervical paraspinals.   Leigh Minerva III PT, DPT 05/25/24 11:18 AM

## 2024-05-25 ENCOUNTER — Encounter (HOSPITAL_BASED_OUTPATIENT_CLINIC_OR_DEPARTMENT_OTHER): Payer: Self-pay | Admitting: Physical Therapy

## 2024-05-31 ENCOUNTER — Ambulatory Visit (HOSPITAL_BASED_OUTPATIENT_CLINIC_OR_DEPARTMENT_OTHER): Payer: Self-pay | Admitting: Physical Therapy

## 2024-05-31 ENCOUNTER — Encounter (HOSPITAL_BASED_OUTPATIENT_CLINIC_OR_DEPARTMENT_OTHER): Payer: Self-pay | Admitting: Physical Therapy

## 2024-05-31 DIAGNOSIS — M5412 Radiculopathy, cervical region: Secondary | ICD-10-CM | POA: Diagnosis not present

## 2024-05-31 DIAGNOSIS — M62838 Other muscle spasm: Secondary | ICD-10-CM

## 2024-05-31 DIAGNOSIS — R29898 Other symptoms and signs involving the musculoskeletal system: Secondary | ICD-10-CM

## 2024-05-31 NOTE — Therapy (Signed)
 OUTPATIENT PHYSICAL THERAPY CERVICAL EVALUATION   Patient Name: DEFOREST MAIDEN MRN: [id number] DOB:1955/10/04, 68 y.o., male Today's Date: 05/31/2024  END OF SESSION:  PT End of Session - 05/31/24 0805     Visit Number 3    Number of Visits 12    Date for Recertification  07/02/24    Authorization Type MCR A&B    PT Start Time 0800    PT Stop Time 0842    PT Time Calculation (min) 42 min    Activity Tolerance Patient tolerated treatment well    Behavior During Therapy [facility] for tasks assessed/performed           Past Medical History:  Diagnosis Date   Allergic rhinitis    Cervical spine disease    Esophageal reflux    GERD (gastroesophageal reflux disease)    Gilbert's syndrome    Hypercholesterolemia    Hyperlipidemia    Inguinal hernia    RIGHT   Prostatitis    RBBB    Shingles 03/2014   Past Surgical History:  Procedure Laterality Date   HERNIA REPAIR  2004   Patient Active Problem List   Diagnosis Date Noted   Benign localized prostatic hyperplasia with lower urinary tract symptoms (LUTS) 04/05/2024   Chronic pain 04/05/2024   Epigastric pain 04/05/2024   Gastroesophageal reflux disease 04/05/2024   Hematochezia 04/05/2024   Hypercholesterolemia 04/05/2024   Internal hemorrhoids 04/05/2024   Intestinal malabsorption 04/05/2024   Lumbago with sciatica, left side 04/05/2024   Screen for colon cancer 04/05/2024   RBBB 07/14/2023   Dyslipidemia 07/14/2023   Elevated coronary artery calcium  score 07/14/2023   Spinal stenosis of lumbar region 05/13/2023   Spondylolisthesis at L4-L5 level 05/13/2023   Palpitations 05/12/2016     REFERRING PROVIDER: Emiliano Leonce CROME, PA-C  REFERRING DIAG: M54.12 (ICD-10-CM) - Radiculopathy, cervical region  THERAPY DIAG:  Radiculopathy, cervical region  Other muscle spasm  Other symptoms and signs involving the musculoskeletal system  Other signs and symptoms involving the musculoskeletal system  Rationale  for Evaluation and Treatment: Rehabilitation  ONSET DATE: PT order 05/14/24  SUBJECTIVE:                                                                                                                                                                                                         SUBJECTIVE STATEMENT: The patient reports he was more sore yesterday. He reports overall it has been about the same   Eval: Pt reports neck pain began 25-30 years ago.  He gave up golf due to  neck pain.  He didn't have neck pain for awhile.  Pt reports not sure why his pain worsened in 2018.  He went to [employer] in 2018 and received a shot.  He also received PT in 2018.  Pt reports improved sx's with injection and PT.   Pt c/o's of R sided neck and shoulder pain, numbness in R UE along radial side of forearm, and some spasms in his R shoulder.     Pt had a subacromial injection which helped for a few days though the pain began to return.  He also took prednisone  which helped more in the beginning.  Pt states his pain slowly returned after the prednisone .     PA note indicated x rays showing:  loss of lordotic curvature.  Multilevel spondylosis with decreased disc spacing and anterior osteophyte formation.  There is sclerosis and most prominent degenerative findings at the C6-C7 level.  Anterolisthesis of C2 on C3 and C3 on C4.  Pt reports he is sleeping in the recliner for 5 days.  His pain is worst at night and he has disturbed sleep.  He has pain at rest.        Hand dominance:  R hand dominant, though writes and eats with L hand  PERTINENT HISTORY:  Cervical anterolisthesis  Hx of chronic lumbar pain--spinal stenosis and Grade 1 anterolisthesis L3-L4 and L4-5.  He receives injections.   PAIN:  NPRS:  2-3/10 current, 9-10/10 worst, 1/10 best Location:  R sided cervical and shoulder Easing Factors: massaging/putting pressure on R UT, alternating tylenol and Ibuprofen, hot shower,   reaching Aggravating Factors:  at rest, tilting head down to look at phone, looking up  PRECAUTIONS: Other: cervical and lumbar anterolisthesis     WEIGHT BEARING RESTRICTIONS: No  FALLS:  Has patient fallen in last 6 months? 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   PLOF: Independent  PATIENT GOALS: decrease pain, return to PLOF, avoid surgery, improve posture   OBJECTIVE:  Note: Objective measures were completed at Evaluation unless otherwise noted.  DIAGNOSTIC FINDINGS:  Cervical X rays: BONES: Diffuse cervical kyphosis. Fixed 3 mm anterolisthesis C2-C3, likely degenerative in nature. Incomplete segmentation C5-6 with rudimentary disc space.   DISCS AND DEGENERATIVE CHANGES: Disc space narrowing and endplate remodeling of C4-5 and C6-7 in keeping with changes of advanced degenerative disc disease. Moderate degenerative changes seen at C3-4. Asymmetric left facet arthrosis noted at C3-C4.   SOFT TISSUES: No prevertebral soft tissue swelling.   IMPRESSION: 1. Fixed 3 mm anterolisthesis C2-3, likely degenerative in nature. No abnormal motion with flexion and extension to suggest ligamentous instability. 2. Incomplete segmentation C5-6 with rudimentary disc space. 3. Disc space narrowing and endplate remodeling of C4-5 and C6-7, consistent with advanced degenerative disc disease. 4. Asymmetric left facet arthrosis at C3-C4, not well visualized. 5. Mild congenital narrowing of the spinal canal.  Shoulder x ray: FINDINGS: There is no evidence of fracture or dislocation. Mild acromioclavicular spurring. No erosive change or evidence of focal bone abnormality. Soft tissues are unremarkable.   IMPRESSION: Mild acromioclavicular spurring.  PATIENT  SURVEYS:  NDI:  NECK DISABILITY INDEX  Date: 05/21/24 Score  Pain intensity 2 = The pain is moderate at the moment  2. Personal care (washing, dressing, etc.) 1 =  I can look after myself normally but it causes extra pain  3. Lifting 4 =  I can only lift very light weights  4. Reading 0 = I can read as much as I want to with no pain in my neck  5. Headaches 4 = I have severe headaches, which come frequently   6. Concentration 2 = I have a fair degree of difficulty in concentrating when I want to  7. Work 3 =  I cannot do my usual work  8. Driving 2 =  I can drive my car as long as I want with moderate pain in my neck  9. Sleeping 1 = My sleep is slightly disturbed (less than 1 hr sleepless)  10. Recreation 1 =  I am able to engage in all my recreation activities, with some pain in my neck  Total 20/50   Minimum Detectable Change (90% confidence): 5 points or 10% points  COGNITION: Overall cognitive status: Within functional limits for tasks assessed  SENSATION: Check next visit  POSTURE: forward head  PALPATION: Pt felts spasms in R UE with palpation of trigger point in R UT.  Pt had tenderness with palpation of R UT > L UT.  He has soft tissue tightness in R > L UT.  Pt had tenderness in R sided cervical paraspinals.    CERVICAL ROM:   Active ROM A/PROM (deg) eval  Flexion 28 with pain  Extension   Right lateral flexion 30 with a little pain  Left lateral flexion 27  Right rotation 54 with a little pain  Left rotation 53   (Blank rows = not tested)  UPPER EXTREMITY ROM:  Active ROM Right eval Left eval  Shoulder flexion [facility] WFL and greater ROM on L  Shoulder extension    Shoulder abduction [facility] WFL and greater ROM on L  Shoulder adduction    Shoulder extension    Shoulder internal rotation    Shoulder external rotation    Elbow flexion    Elbow extension    Wrist flexion    Wrist extension    Wrist ulnar deviation    Wrist radial deviation    Wrist pronation     Wrist supination     (Blank rows = not tested)  UPPER EXTREMITY MMT:  MMT Right eval Left eval  Shoulder flexion 5/5 5/5  Shoulder extension    Shoulder abduction 5/5 5/5  Shoulder adduction    Shoulder extension    Shoulder internal rotation    Shoulder external rotation    Middle trapezius    Lower trapezius    Elbow flexion 5/5 5/5  Elbow extension 5/5 4+/5  Wrist flexion intact   Wrist extension intact   Wrist ulnar deviation    Wrist radial deviation    Wrist pronation    Wrist supination    Grip strength     (Blank rows = not tested)    TREATMENT:  10/23 Manual:  Trigger point release to upper trap and cervical paraspinals  Reviewed self soft tissue mobilization areas Reviewed anatomy of muscle we are working and the reasoning behind the soft tissue mobilization   Scap retraction 2x12 red  Shoulder extension 2x12 red    Last visit:  Manual:  Trigger point release to upper trap and cervical paraspinals   There-ex: upper trap stretch   Neuro-re-ed:  Bilateral ER yellow 2x8  Scap retraction 2x10 red  Shoulder extension 2x10 red   Eval: Pt performed shoulder rolls and scapular retraction with a 3 sec hold x 10 reps.  Pt received a HEP handout and was educated in correct form and appropriate frequency. Pt has a theracane at home.  PT educated pt with using the theracane and demonstrated using the theracane.  PATIENT EDUCATION:  Education details: dx, objective findings, HEP, rationale of interventions, relevant anatomy, POC, and prognosis. Person educated: Patient Education method: Explanation, Demonstration, Tactile cues, Verbal cues, and Handouts Education comprehension: verbalized understanding, returned demonstration, verbal cues required, tactile cues required, and needs further education  HOME EXERCISE PROGRAM: Access  Code: [email or web address] URL: [email or web address] Date: 05/21/2024 Prepared by: Mose Minerva  Exercises - Standing Backward Shoulder Rolls  - 2-3 x daily - 7 x weekly - 2 sets - 10 reps - Standing Scapular Retraction  - 2 x daily - 7 x weekly - 2 sets - 10 reps - 3 second  hold  ASSESSMENT:  CLINICAL IMPRESSION: The patient had significant spasming in the right sided cervical paraspinals on the right. We reviewed where the patient should focus on with his soft tissue work. We also reviewed postural exercises. We will continue to progress as tolerated. He was advised to progress with exercises and soft tissue mobilization.   Eval: Patient is a 68 y.o. male with a dx of cervical radiculopathy presenting to the clinic with R sided cervical and shoulder pain, numbness in R UE, trigger points in UT, and limited cervical ROM.  Pt has a hx of cervical pain.  Pt had an exacerbation in 2018 and responded well to an injection and PT.  Pt received a shot and prednisone .  He states they helped but the pain returned.  He reports pain with looking up and down.  He has pain at rest and his worst pain is at night.  Pt should benefit from skilled PT to address impairments and improve overall function.          OBJECTIVE IMPAIRMENTS: decreased ROM, decreased strength, hypomobility, impaired flexibility, and pain.   ACTIVITY LIMITATIONS: sleeping  PARTICIPATION LIMITATIONS:   PERSONAL FACTORS: Time since onset of injury/illness/exacerbation and 1 comorbidity: chronic lumbar pain are also affecting patient's functional outcome.   REHAB POTENTIAL: Good  CLINICAL DECISION MAKING: Stable/uncomplicated  EVALUATION COMPLEXITY: Low   GOALS:  SHORT TERM GOALS: Target date:  06/11/2024   Pt will be independent and compliant with HEP for improved pain, sx's, ROM, postural strength, and function. Baseline:  Goal status:  complaint. Progressing HEP 10/23  2.  Pt will report at least a 25%  improvement in pain and sx's overall.  Baseline:  Goal status: no improvement yet 10/23  3.  Pt will demo improved cervical rotation AROM to be at least 60 deg bilat and Sb'ing to at least 35 deg bilat for improved stiffness and mobility.   Baseline:  Goal status: INITIAL    LONG TERM GOALS: Target date: 07/02/2024  Pt will report at least a 70% improvement in pain  and sx's overall.  Baseline:  Goal status: INITIAL  2.  Pt's NDI will improve by at 5 points for a clinically significant improvement in self perceived disability and function.   Baseline:  Goal status: INITIAL  3.  Pt will sleep at least 5/7 nights per week without disturbed sleep from pain and/or radicular sx's.  Baseline:  Goal status: INITIAL  4.  Pt will demo improved soft tissue tightness in cervical paraspinals and UT including reduced trigger points in UT for reduced muscle tension and decreased pain. Baseline:  Goal status: INITIAL     PLAN:  PT FREQUENCY: 1-2x/week  PT DURATION: 6 weeks  PLANNED INTERVENTIONS: [id number]- PT Re-evaluation, 97750- Physical Performance Testing, 97110-Therapeutic exercises, 97530- Therapeutic activity, [id number]- Neuromuscular re-education, 97535- Self Care, [id number]- Manual therapy, [id number]- Aquatic Therapy, [id number]- Electrical stimulation (unattended), [id number]- Electrical stimulation (manual), [id number]- Ultrasound, [id number] (1-2 muscles), [id number] (3+ muscles)- Dry Needling, Patient/Family education, Taping, Joint mobilization, Cryotherapy, and Moist heat  PLAN FOR NEXT SESSION:  Check sensation to LT next visit.  Postural stabilization and strengthening.  Cervical rotation AROM w/n pt tolerance.  STM and TPR to UT and cervical paraspinals.   Alm Don PT DPT 05/31/24 8:06 AM

## 2024-06-05 ENCOUNTER — Encounter (HOSPITAL_BASED_OUTPATIENT_CLINIC_OR_DEPARTMENT_OTHER): Payer: Self-pay | Admitting: Physical Therapy

## 2024-06-05 ENCOUNTER — Ambulatory Visit (HOSPITAL_BASED_OUTPATIENT_CLINIC_OR_DEPARTMENT_OTHER): Admitting: Physical Therapy

## 2024-06-05 DIAGNOSIS — M62838 Other muscle spasm: Secondary | ICD-10-CM

## 2024-06-05 DIAGNOSIS — M5412 Radiculopathy, cervical region: Secondary | ICD-10-CM | POA: Diagnosis not present

## 2024-06-05 DIAGNOSIS — R29898 Other symptoms and signs involving the musculoskeletal system: Secondary | ICD-10-CM

## 2024-06-05 DIAGNOSIS — M6281 Muscle weakness (generalized): Secondary | ICD-10-CM

## 2024-06-05 DIAGNOSIS — M5459 Other low back pain: Secondary | ICD-10-CM

## 2024-06-05 NOTE — Therapy (Signed)
 OUTPATIENT PHYSICAL THERAPY CERVICAL EVALUATION   Patient Name: Dustin Rice MRN: [id number] DOB:06/12/1956, 68 y.o., male Today's Date: 06/05/2024  END OF SESSION:  PT End of Session - 06/05/24 1512     Visit Number 4    Number of Visits 12    Date for Recertification  07/02/24    Authorization Type MCR A&B    PT Start Time 1430    PT Stop Time 1510    PT Time Calculation (min) 40 min    Activity Tolerance Patient tolerated treatment well    Behavior During Therapy [facility] for tasks assessed/performed            Past Medical History:  Diagnosis Date   Allergic rhinitis    Cervical spine disease    Esophageal reflux    GERD (gastroesophageal reflux disease)    Gilbert's syndrome    Hypercholesterolemia    Hyperlipidemia    Inguinal hernia    RIGHT   Prostatitis    RBBB    Shingles 03/2014   Past Surgical History:  Procedure Laterality Date   HERNIA REPAIR  2004   Patient Active Problem List   Diagnosis Date Noted   Benign localized prostatic hyperplasia with lower urinary tract symptoms (LUTS) 04/05/2024   Chronic pain 04/05/2024   Epigastric pain 04/05/2024   Gastroesophageal reflux disease 04/05/2024   Hematochezia 04/05/2024   Hypercholesterolemia 04/05/2024   Internal hemorrhoids 04/05/2024   Intestinal malabsorption 04/05/2024   Lumbago with sciatica, left side 04/05/2024   Screen for colon cancer 04/05/2024   RBBB 07/14/2023   Dyslipidemia 07/14/2023   Elevated coronary artery calcium  score 07/14/2023   Spinal stenosis of lumbar region 05/13/2023   Spondylolisthesis at L4-L5 level 05/13/2023   Palpitations 05/12/2016     REFERRING PROVIDER: Emiliano Leonce CROME, PA-C  REFERRING DIAG: M54.12 (ICD-10-CM) - Radiculopathy, cervical region  THERAPY DIAG:  Radiculopathy, cervical region  Other muscle spasm  Other symptoms and signs involving the musculoskeletal system  Other low back pain  Muscle weakness (generalized)  Other signs and  symptoms involving the musculoskeletal system  Rationale for Evaluation and Treatment: Rehabilitation  ONSET DATE: PT order 05/14/24  SUBJECTIVE:                                                                                                                                                                                                         SUBJECTIVE STATEMENT: The patient ontinues to report that overall he is the same.    Eval: Pt reports neck pain began 25-30 years ago.  He  gave up golf due to neck pain.  He didn't have neck pain for awhile.  Pt reports not sure why his pain worsened in 2018.  He went to [facility] in 2018 and received a shot.  He also received PT in 2018.  Pt reports improved sx's with injection and PT.   Pt c/o's of R sided neck and shoulder pain, numbness in R UE along radial side of forearm, and some spasms in his R shoulder.     Pt had a subacromial injection which helped for a few days though the pain began to return.  He also took prednisone  which helped more in the beginning.  Pt states his pain slowly returned after the prednisone .     PA note indicated x rays showing:  loss of lordotic curvature.  Multilevel spondylosis with decreased disc spacing and anterior osteophyte formation.  There is sclerosis and most prominent degenerative findings at the C6-C7 level.  Anterolisthesis of C2 on C3 and C3 on C4.  Pt reports he is sleeping in the recliner for 5 days.  His pain is worst at night and he has disturbed sleep.  He has pain at rest.        Hand dominance:  R hand dominant, though writes and eats with L hand  PERTINENT HISTORY:  Cervical anterolisthesis  Hx of chronic lumbar pain--spinal stenosis and Grade 1 anterolisthesis L3-L4 and L4-5.  He receives injections.   PAIN:  NPRS:  2-3/10 current, 9-10/10 worst, 1/10 best Location:  R sided cervical and shoulder Easing Factors: massaging/putting pressure on R UT, alternating tylenol and  Ibuprofen, hot shower,  reaching Aggravating Factors:  at rest, tilting head down to look at phone, looking up  PRECAUTIONS: Other: cervical and lumbar anterolisthesis     WEIGHT BEARING RESTRICTIONS: No  FALLS:  Has patient fallen in last 6 months? 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   PLOF: Independent  PATIENT GOALS: decrease pain, return to PLOF, avoid surgery, improve posture   OBJECTIVE:  Note: Objective measures were completed at Evaluation unless otherwise noted.  DIAGNOSTIC FINDINGS:  Cervical X rays: BONES: Diffuse cervical kyphosis. Fixed 3 mm anterolisthesis C2-C3, likely degenerative in nature. Incomplete segmentation C5-6 with rudimentary disc space.   DISCS AND DEGENERATIVE CHANGES: Disc space narrowing and endplate remodeling of C4-5 and C6-7 in keeping with changes of advanced degenerative disc disease. Moderate degenerative changes seen at C3-4. Asymmetric left facet arthrosis noted at C3-C4.   SOFT TISSUES: No prevertebral soft tissue swelling.   IMPRESSION: 1. Fixed 3 mm anterolisthesis C2-3, likely degenerative in nature. No abnormal motion with flexion and extension to suggest ligamentous instability. 2. Incomplete segmentation C5-6 with rudimentary disc space. 3. Disc space narrowing and endplate remodeling of C4-5 and C6-7, consistent with advanced degenerative disc disease. 4. Asymmetric left facet arthrosis at C3-C4, not well visualized. 5. Mild congenital narrowing of the spinal canal.  Shoulder x ray: FINDINGS: There is no evidence of fracture or dislocation. Mild acromioclavicular spurring. No erosive change or evidence of focal bone abnormality. Soft tissues are unremarkable.   IMPRESSION: Mild  acromioclavicular spurring.  PATIENT SURVEYS:  NDI:  NECK DISABILITY INDEX  Date: 05/21/24 Score  Pain intensity 2 = The pain is moderate at the moment  2. Personal  care (washing, dressing, etc.) 1 =  I can look after myself normally but it causes extra pain  3. Lifting 4 =  I can only lift very light weights  4. Reading 0 = I can read as much as I want to with no pain in my neck  5. Headaches 4 = I have severe headaches, which come frequently   6. Concentration 2 = I have a fair degree of difficulty in concentrating when I want to  7. Work 3 =  I cannot do my usual work  8. Driving 2 =  I can drive my car as long as I want with moderate pain in my neck  9. Sleeping 1 = My sleep is slightly disturbed (less than 1 hr sleepless)  10. Recreation 1 =  I am able to engage in all my recreation activities, with some pain in my neck  Total 20/50   Minimum Detectable Change (90% confidence): 5 points or 10% points  COGNITION: Overall cognitive status: Within functional limits for tasks assessed  SENSATION: Check next visit  POSTURE: forward head  PALPATION: Pt felts spasms in R UE with palpation of trigger point in R UT.  Pt had tenderness with palpation of R UT > L UT.  He has soft tissue tightness in R > L UT.  Pt had tenderness in R sided cervical paraspinals.    CERVICAL ROM:   Active ROM A/PROM (deg) eval  Flexion 28 with pain  Extension   Right lateral flexion 30 with a little pain  Left lateral flexion 27  Right rotation 54 with a little pain  Left rotation 53   (Blank rows = not tested)  UPPER EXTREMITY ROM:  Active ROM Right eval Left eval  Shoulder flexion [facility] WFL and greater ROM on L  Shoulder extension    Shoulder abduction [facility] WFL and greater ROM on L  Shoulder adduction    Shoulder extension    Shoulder internal rotation    Shoulder external rotation    Elbow flexion    Elbow extension    Wrist flexion    Wrist extension    Wrist ulnar deviation     Wrist radial deviation    Wrist pronation    Wrist supination     (Blank rows = not tested)  UPPER EXTREMITY MMT:  MMT Right eval Left eval  Shoulder flexion 5/5 5/5  Shoulder extension    Shoulder abduction 5/5 5/5  Shoulder adduction    Shoulder extension    Shoulder internal rotation    Shoulder external rotation    Middle trapezius    Lower trapezius    Elbow flexion 5/5 5/5  Elbow extension 5/5 4+/5  Wrist flexion intact   Wrist extension intact   Wrist ulnar deviation    Wrist radial deviation    Wrist pronation    Wrist supination    Grip strength     (Blank rows = not tested)    TREATMENT:  10/28 Manual:  Trigger point release to upper trap and cervical paraspinals  Reviewed self soft tissue mobilization areas Reviewed anatomy of muscle we are working and the reasoning behind the soft tissue mobilization   Neuro-re-ed: : wand flexion stretch 2 lbs 2x6 ( had numbness) x10 without numbness   Scap retraction 2x12 red  Shoulder extension 2x12 red   Attempted to advance to green band but patient was unable 2nd to numbness  Supine ABC no weight and 1 lb     PATIENT EDUCATION:  Education details: dx, objective findings, HEP, rationale of interventions, relevant anatomy, POC, and prognosis. Person educated: Patient Education method: Explanation, Demonstration, Tactile cues, Verbal cues, and Handouts Education comprehension: verbalized understanding, returned demonstration, verbal cues required, tactile cues required, and needs further education  HOME EXERCISE PROGRAM: Access Code: [email or web address] URL: [email or web address] Date: 05/21/2024 Prepared by: Mose Minerva  Exercises - Standing Backward Shoulder Rolls  - 2-3 x daily - 7 x weekly - 2 sets - 10 reps - Standing Scapular Retraction  - 2 x daily - 7 x weekly - 2 sets  - 10 reps - 3 second  hold  ASSESSMENT:  CLINICAL IMPRESSION: The patient continues to have intemrittent numbness even with ase exercises. He was advised after this number of visits we should be seeing some improvement in his sympotms. He had numbness with band exercises when he was looking down. When he looks up it improves. He continues to have spamsing in his neck. He was advised it may be time to return to the MD to see what is going on with the numbness.  Eval: Patient is a 68 y.o. male with a dx of cervical radiculopathy presenting to the clinic with R sided cervical and shoulder pain, numbness in R UE, trigger points in UT, and limited cervical ROM.  Pt has a hx of cervical pain.  Pt had an exacerbation in 2018 and responded well to an injection and PT.  Pt received a shot and prednisone .  He states they helped but the pain returned.  He reports pain with looking up and down.  He has pain at rest and his worst pain is at night.  Pt should benefit from skilled PT to address impairments and improve overall function.          OBJECTIVE IMPAIRMENTS: decreased ROM, decreased strength, hypomobility, impaired flexibility, and pain.   ACTIVITY LIMITATIONS: sleeping  PARTICIPATION LIMITATIONS:   PERSONAL FACTORS: Time since onset of injury/illness/exacerbation and 1 comorbidity: chronic lumbar pain are also affecting patient's functional outcome.   REHAB POTENTIAL: Good  CLINICAL DECISION MAKING: Stable/uncomplicated  EVALUATION COMPLEXITY: Low   GOALS:  SHORT TERM GOALS: Target date:  06/11/2024   Pt will be independent and compliant with HEP for improved pain, sx's, ROM, postural strength, and function. Baseline:  Goal status:  complaint. Progressing HEP 10/23  2.  Pt will report at least a 25% improvement in pain and sx's overall.  Baseline:  Goal status: no improvement yet 10/23  3.  Pt will demo improved cervical rotation AROM to be at least 60 deg bilat and Sb'ing to at  least 35 deg bilat for improved stiffness and mobility.   Baseline:  Goal status: INITIAL    LONG TERM GOALS: Target date: 07/02/2024  Pt will report at least a 70% improvement in pain and sx's overall.  Baseline:  Goal status: No change at this time 10/28  2.  Pt's NDI will improve by at 5 points for a clinically  significant improvement in self perceived disability and function.   Baseline:  Goal status: INITIAL  3.  Pt will sleep at least 5/7 nights per week without disturbed sleep from pain and/or radicular sx's.  Baseline:  Goal status: INITIAL  4.  Pt will demo improved soft tissue tightness in cervical paraspinals and UT including reduced trigger points in UT for reduced muscle tension and decreased pain. Baseline:  Goal status: INITIAL     PLAN:  PT FREQUENCY: 1-2x/week  PT DURATION: 6 weeks  PLANNED INTERVENTIONS: 97164- PT Re-evaluation, 97750- Physical Performance Testing, 97110-Therapeutic exercises, 97530- Therapeutic activity, [id number]- Neuromuscular re-education, 97535- Self Care, [id number]- Manual therapy, [id number]- Aquatic Therapy, [id number]- Electrical stimulation (unattended), [id number]- Electrical stimulation (manual), [id number]- Ultrasound, [id number] (1-2 muscles), 20561 (3+ muscles)- Dry Needling, Patient/Family education, Taping, Joint mobilization, Cryotherapy, and Moist heat  PLAN FOR NEXT SESSION:  Check sensation to LT next visit.  Postural stabilization and strengthening.  Cervical rotation AROM w/n pt tolerance.  STM and TPR to UT and cervical paraspinals.   Alm Don PT DPT 06/05/24 4:23 PM

## 2024-06-07 ENCOUNTER — Encounter: Payer: Self-pay | Admitting: Podiatry

## 2024-06-07 ENCOUNTER — Ambulatory Visit: Admitting: Podiatry

## 2024-06-07 DIAGNOSIS — B351 Tinea unguium: Secondary | ICD-10-CM

## 2024-06-07 DIAGNOSIS — L03031 Cellulitis of right toe: Secondary | ICD-10-CM

## 2024-06-07 MED ORDER — TERBINAFINE HCL 250 MG PO TABS: 250.0000 mg | ORAL_TABLET | Freq: Every day | ORAL | 0 refills | Status: AC

## 2024-06-07 MED ORDER — DOXYCYCLINE HYCLATE 100 MG PO TABS
100.0000 mg | ORAL_TABLET | Freq: Two times a day (BID) | ORAL | 1 refills | Status: AC
Start: 2024-06-07 — End: ?

## 2024-06-08 ENCOUNTER — Ambulatory Visit (HOSPITAL_BASED_OUTPATIENT_CLINIC_OR_DEPARTMENT_OTHER): Payer: Self-pay | Admitting: Physical Therapy

## 2024-06-08 ENCOUNTER — Encounter (HOSPITAL_BASED_OUTPATIENT_CLINIC_OR_DEPARTMENT_OTHER): Payer: Self-pay | Admitting: Physical Therapy

## 2024-06-08 DIAGNOSIS — M62838 Other muscle spasm: Secondary | ICD-10-CM

## 2024-06-08 DIAGNOSIS — R29898 Other symptoms and signs involving the musculoskeletal system: Secondary | ICD-10-CM

## 2024-06-08 DIAGNOSIS — M5412 Radiculopathy, cervical region: Secondary | ICD-10-CM | POA: Diagnosis not present

## 2024-06-08 NOTE — Progress Notes (Signed)
 He presents today for nail check matricectomy hallux right fibular border.  States that has been a little oozy lately and a little bit tender.  He also goes on to say that he has recently been to his primary care provider and was informed that his blood work was good and that he had no problems with his liver profile.  He states that he has been taking the Lamisil  his skin has been staying in good condition however he is still concerned about the hallux nail plate left.  Objective: Vital signs are stable alert oriented x 3 pulses are palpable.  Fibular border of the hallux right does demonstrate some mild erythema some fibrin deposition as well.  Mildly tender on palpation.  Skin demonstrates complete resolution of tinea pedis.  Also demonstrates nail dystrophy onychomycosis hallux left.  Plan: I will continue him on his Lamisil  No. 91 p.o. daily I did look at his blood work on his phone from his primary care provider which was normal liver function and BUN and creatinine as well.  I did start him on doxycycline  for the paronychia.  He will start soaking Epsom salts and warm water.

## 2024-06-08 NOTE — Therapy (Signed)
 OUTPATIENT PHYSICAL THERAPY CERVICAL TREATMENT   Patient Name: Dustin Rice MRN: [id number] DOB:[birth year], 68 y.o., male Today's Date: 06/08/2024  END OF SESSION:  PT End of Session - 06/08/24 0817     Visit Number 5    Number of Visits 12    Date for Recertification  07/02/24    Authorization Type MCR A&B    PT Start Time 0807    PT Stop Time 0852    PT Time Calculation (min) 45 min    Activity Tolerance Patient tolerated treatment well    Behavior During Therapy [facility] for tasks assessed/performed             Past Medical History:  Diagnosis Date   Allergic rhinitis    Cervical spine disease    Esophageal reflux    GERD (gastroesophageal reflux disease)    Gilbert's syndrome    Hypercholesterolemia    Hyperlipidemia    Inguinal hernia    RIGHT   Prostatitis    RBBB    Shingles 03/2014   Past Surgical History:  Procedure Laterality Date   HERNIA REPAIR  2004   Patient Active Problem List   Diagnosis Date Noted   Benign localized prostatic hyperplasia with lower urinary tract symptoms (LUTS) 04/05/2024   Chronic pain 04/05/2024   Epigastric pain 04/05/2024   Gastroesophageal reflux disease 04/05/2024   Hematochezia 04/05/2024   Hypercholesterolemia 04/05/2024   Internal hemorrhoids 04/05/2024   Intestinal malabsorption 04/05/2024   Lumbago with sciatica, left side 04/05/2024   Screen for colon cancer 04/05/2024   RBBB 07/14/2023   Dyslipidemia 07/14/2023   Elevated coronary artery calcium  score 07/14/2023   Spinal stenosis of lumbar region 05/13/2023   Spondylolisthesis at L4-L5 level 05/13/2023   Palpitations 05/12/2016     REFERRING PROVIDER: Emiliano Leonce CROME, PA-C  REFERRING DIAG: M54.12 (ICD-10-CM) - Radiculopathy, cervical region  THERAPY DIAG:  Radiculopathy, cervical region  Other muscle spasm  Other symptoms and signs involving the musculoskeletal system  Other signs and symptoms involving the musculoskeletal  system  Rationale for Evaluation and Treatment: Rehabilitation  ONSET DATE: PT order 05/14/24  SUBJECTIVE:                                                                                                                                                                                                         SUBJECTIVE STATEMENT: Pt reports having numbness in proximal R UE.  Pt has sx's in AM and loosens up t/o the day feeling better.  He has increased pain in the evening with his worst  pain at night.  He had pain waiting in the drive through when looking to the left.  Pt took Tylenol this AM.  Pt states when he corrects his posture, his sx's improve. Pt states he has felt an improvement in pain with each PT session.  Pt returns to MD on 12/10   Hand dominance:  R hand dominant, though writes and eats with L hand  PERTINENT HISTORY:  Cervical anterolisthesis  Hx of chronic lumbar pain--spinal stenosis and Grade 1 anterolisthesis L3-L4 and L4-5.  He receives injections.   PAIN:  NPRS:  1/10 current, 9-10/10 worst, 1/10 best Location:  R sided cervical and UT Easing Factors: massaging/putting pressure on R UT, alternating tylenol and Ibuprofen, hot shower,  reaching Aggravating Factors:  at rest, tilting head down to look at phone, looking up  PRECAUTIONS: Other: cervical and lumbar anterolisthesis     WEIGHT BEARING RESTRICTIONS: No  FALLS:  Has patient fallen in last 6 months? 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   PLOF: Independent  PATIENT GOALS: decrease pain, return to PLOF, avoid surgery, improve posture   OBJECTIVE:  Note: Objective measures were completed at Evaluation unless otherwise noted.  DIAGNOSTIC FINDINGS:  Cervical X  rays: BONES: Diffuse cervical kyphosis. Fixed 3 mm anterolisthesis C2-C3, likely degenerative in nature. Incomplete segmentation C5-6 with rudimentary disc space.   DISCS AND DEGENERATIVE CHANGES: Disc space narrowing and endplate remodeling of C4-5 and C6-7 in keeping with changes of advanced degenerative disc disease. Moderate degenerative changes seen at C3-4. Asymmetric left facet arthrosis noted at C3-C4.   SOFT TISSUES: No prevertebral soft tissue swelling.   IMPRESSION: 1. Fixed 3 mm anterolisthesis C2-3, likely degenerative in nature. No abnormal motion with flexion and extension to suggest ligamentous instability. 2. Incomplete segmentation C5-6 with rudimentary disc space. 3. Disc space narrowing and endplate remodeling of C4-5 and C6-7, consistent with advanced degenerative disc disease. 4. Asymmetric left facet arthrosis at C3-C4, not well visualized. 5. Mild congenital narrowing of the spinal canal.  Shoulder x ray: FINDINGS: There is no evidence of fracture or dislocation. Mild acromioclavicular spurring. No erosive change or evidence of focal bone abnormality. Soft tissues are unremarkable.   IMPRESSION: Mild acromioclavicular spurring.  PATIENT SURVEYS:  NDI:  NECK DISABILITY INDEX  Date: 05/21/24 Score  Pain intensity 2 = The pain is moderate at the moment  2. Personal care (washing, dressing, etc.) 1 =  I can look after myself normally but it causes extra pain  3. Lifting 4 =  I can only lift very light weights  4. Reading 0 = I can read as much as I want to with no pain in my neck  5. Headaches 4 = I have severe headaches, which come frequently   6. Concentration 2 = I have a fair degree of difficulty in concentrating when I want to  7. Work 3 =  I cannot do my usual work  8. Driving 2 =  I can drive my car as long as I want with moderate pain in my neck  9. Sleeping 1 = My sleep is slightly disturbed (less than 1 hr sleepless)  10. Recreation 1 =  I am  able to engage in all my recreation activities, with some pain in my neck  Total 20/50   Minimum Detectable Change (90% confidence): 5 points or 10% points  COGNITION: Overall cognitive status: Within functional limits for tasks assessed  SENSATION: Check next visit  POSTURE: forward head  PALPATION: Pt felts spasms in R UE with palpation of trigger point in R UT.  Pt had tenderness with palpation of R UT > L UT.  He has soft tissue tightness in R > L UT.  Pt had tenderness in R sided cervical paraspinals.    CERVICAL ROM:   Active ROM A/PROM (deg) eval  Flexion 28 with pain  Extension   Right lateral flexion 30 with a little pain  Left lateral flexion 27  Right rotation 54 with a little pain  Left rotation 53   (Blank rows = not tested)  UPPER EXTREMITY ROM:  Active ROM Right eval Left eval  Shoulder flexion [facility] WFL and greater ROM on L  Shoulder extension    Shoulder abduction [facility] WFL and greater ROM on L  Shoulder adduction    Shoulder extension    Shoulder internal rotation    Shoulder external rotation    Elbow flexion    Elbow extension    Wrist flexion    Wrist extension    Wrist ulnar deviation    Wrist radial deviation    Wrist pronation    Wrist supination     (Blank rows = not tested)  UPPER EXTREMITY MMT:  MMT Right eval Left eval  Shoulder flexion 5/5 5/5  Shoulder extension    Shoulder abduction 5/5 5/5  Shoulder adduction    Shoulder extension    Shoulder internal rotation    Shoulder external rotation    Middle trapezius    Lower trapezius    Elbow flexion 5/5 5/5  Elbow extension 5/5 4+/5  Wrist flexion intact   Wrist extension intact   Wrist ulnar deviation    Wrist radial deviation    Wrist pronation    Wrist supination    Grip strength     (Blank rows = not tested)    TREATMENT:                                                                                                                               10/31 Sensation:   2+ to LT t/o bilat UE dermatomes  Pt received STM and TPR to bilat UT R > L seated and to bilat cervical paraspinals, R > L in supine with LE's elevated on wedge.  Supine shoulder ABC x 1 rep bilat with 1# Standing rows with RTB 2x10-12  Standing shoulder extension with RTB 2x10  See below for pt education.   10/28 Manual:  Trigger point release to upper trap and cervical paraspinals  Reviewed self soft tissue mobilization areas Reviewed anatomy of muscle we are working and the reasoning behind the soft tissue mobilization   Neuro-re-ed: : wand flexion stretch 2 lbs 2x6 ( had numbness) x10  without numbness   Scap retraction 2x12 red  Shoulder extension 2x12 red   Attempted to advance to green band but patient was unable 2nd to numbness  Supine ABC no weight and 1 lb     PATIENT EDUCATION:  Education details: dx, HEP, rationale of interventions, relevant anatomy, appropriate response, sx response, posture, POC, and prognosis.  PT answered pt's questions.  Person educated: Patient Education method: Explanation, Demonstration, Tactile cues, Verbal cues, and Handouts Education comprehension: verbalized understanding, returned demonstration, verbal cues required, tactile cues required, and needs further education  HOME EXERCISE PROGRAM: Access Code: [email or web address] URL: [email or web address] Date: 05/21/2024 Prepared by: Mose Minerva  Exercises - Standing Backward Shoulder Rolls  - 2-3 x daily - 7 x weekly - 2 sets - 10 reps - Standing Scapular Retraction  - 2 x daily - 7 x weekly - 2 sets - 10 reps - 3 second  hold  ASSESSMENT:  CLINICAL IMPRESSION: Pt continues to have numbness in R UE.  He reports improved sx's when he corrects his posture.  Pt's sensation to LT is WFL in bilat UE dermatomes.  PT performed soft tissue work to R > L UT and cervical paraspinals to improve soft tissue tightness and mobility and pain and to reduce trigger points.  Pt could feel the  numbness in R UE when getting up from the supine position though states it went away after he improved his sitting posture.  Pt felt some numbness when performing rows with scap retraction.  PT instructed pt to reduce the force of scapular retractions and he had improved numbness.  PT answered pt's questions concerning dx, anatomy, and sx's.  Pt reports his pain increased to 3/10 after treatment which he thinks is from working it and his numbness is the same.        OBJECTIVE IMPAIRMENTS: decreased ROM, decreased strength, hypomobility, impaired flexibility, and pain.   ACTIVITY LIMITATIONS: sleeping  PARTICIPATION LIMITATIONS:   PERSONAL FACTORS: Time since onset of injury/illness/exacerbation and 1 comorbidity: chronic lumbar pain are also affecting patient's functional outcome.   REHAB POTENTIAL: Good  CLINICAL DECISION MAKING: Stable/uncomplicated  EVALUATION COMPLEXITY: Low   GOALS:  SHORT TERM GOALS: Target date:  06/11/2024   Pt will be independent and compliant with HEP for improved pain, sx's, ROM, postural strength, and function. Baseline:  Goal status:  complaint. Progressing HEP 10/23  2.  Pt will report at least a 25% improvement in pain and sx's overall.  Baseline:  Goal status: no improvement yet 10/23  3.  Pt will demo improved cervical rotation AROM to be at least 60 deg bilat and Sb'ing to at least 35 deg bilat for improved stiffness and mobility.   Baseline:  Goal status: INITIAL    LONG TERM GOALS: Target date: 07/02/2024  Pt will report at least a 70% improvement in pain and sx's overall.  Baseline:  Goal status: No change at this time 10/28  2.  Pt's NDI will improve by at 5 points for a clinically significant improvement in self perceived disability and function.   Baseline:  Goal status: INITIAL  3.  Pt will sleep at least 5/7 nights per week without disturbed sleep from pain and/or radicular sx's.  Baseline:  Goal status: INITIAL  4.  Pt  will demo improved soft tissue tightness in cervical paraspinals and UT including reduced trigger points in UT for reduced muscle tension and decreased pain. Baseline:  Goal status: INITIAL     PLAN:  PT FREQUENCY: 1-2x/week  PT DURATION: 6 weeks  PLANNED INTERVENTIONS: [id number]- PT Re-evaluation, 97750- Physical Performance Testing, 97110-Therapeutic exercises, 97530- Therapeutic activity, [id number]- Neuromuscular re-education, 97535- Self Care, [id number]- Manual therapy, [id number]- Aquatic Therapy, [id number]- Electrical stimulation (unattended), [id number]- Electrical stimulation (manual), [id number]- Ultrasound, 79439 (1-2 muscles), 20561 (3+ muscles)- Dry Needling, Patient/Family education, Taping, Joint mobilization, Cryotherapy, and Moist heat  PLAN FOR NEXT SESSION:  Check sensation to LT next visit.  Postural stabilization and strengthening.  Cervical rotation AROM w/n pt tolerance.  STM and TPR to UT and cervical paraspinals.   Leigh Minerva III PT, DPT 06/08/24 4:22 PM

## 2024-06-11 ENCOUNTER — Encounter: Payer: Self-pay | Admitting: Radiology

## 2024-06-13 ENCOUNTER — Ambulatory Visit: Admitting: Podiatry

## 2024-06-14 ENCOUNTER — Ambulatory Visit: Admitting: Psychiatry

## 2024-06-14 DIAGNOSIS — F411 Generalized anxiety disorder: Secondary | ICD-10-CM | POA: Diagnosis not present

## 2024-06-14 NOTE — Progress Notes (Signed)
 [facility] Counselor/Therapist Progress Note  Patient ID: Dustin Rice, MRN: [id number],    Date: 06/14/2024  Time Spent: 55 minutes   Treatment Type: Individual Therapy  Reported Symptoms: anxiety, family concerns, health concerns    Mental Status Exam:  Appearance:   Casual and Neat     Behavior:  Appropriate, Sharing, and Motivated  Motor:  Normal  Speech/Language:   Normal Rate  Affect:  anxious  Mood:  anxious  Thought process:  goal directed  Thought content:    WNL  Sensory/Perceptual disturbances:    WNL  Orientation:  oriented to person, place, time/date, situation, day of week, month of year, year, and stated date of Nov. 6, 2025  Attention:  Good  Concentration:  Good  Memory:  WNL  Fund of knowledge:   Good  Insight:    Good  Judgment:   Good  Impulse Control:  Good   Risk Assessment: Danger to Self:  No Self-injurious Behavior: No Danger to Others: No Duty to Warn:no Physical Aggression / Violence:No  Access to Firearms a concern: No  Gang Involvement:No   Subjective: Patient today motivated and reporting symptoms of anxiety, worrying some, stress, and supporting elderly mother.  Still having physical symptoms (bone spur and some degenerative disc). Wife also having some physical concerns as well.  Spoke of some concerns regarding his son and seems to be feeling some more relieved.  Very supportive and his wife and her medical issues, which do seem to be some better more recently.  Her diabetes remains a concern as she has had some fluctuations.  Tends to work on being supportive and nonjudgmental within the family and beyond.  Showing really good motivation and overall his worrying has decreased some, as patient is still working on this.  Is getting better at diffusing situations within the family.  Patient continues to make decisions on behalf of his elderly mother who is cared for partly by family but mostly by caretakers.  He has been dealing with  a good amount of stress and anxiety and worked well in session today while showing good motivation and very thorough and his work on making positive decisions as he recognizes that impacts his anxiety level.     Interventions: Cognitive Behavioral Therapy, Solution-Oriented/Positive Psychology, and Ego-Supportive Long term goal: Reduce overall level, frequency, and intensity of anxiety and depression so that daily functioning is not impaired. Short term goal: Increase understanding of beliefs and messages that produce worry and anxiety. Strategies: Identify and use specific coping strategies for anxiety and depression reduction.  Also explore alternative ways of communicating better with spouse and being more sensitive to needs within the family, including both wife and son.    Diagnosis:   ICD-10-CM   1. Generalized anxiety disorder  F41.1      Plan:  Patient focusing well in session today talking through continued work on his concern for his wife with some ongoing pain, his aging mother and trying to have appropriate care for her in the home, and some concern about his adult son (not all details included in this note.) Patient working some although not full-time. Enjoying family times and looking forward to Thanksgiving with family coming in. Some concerns shared in session re: son and choices. (Not all details included in this note due to patient privacy needs and subject sensitivity.) Really good session today for patient as he was able to be very open and process some thoughts and issues revolving around  family relationships that he had not previously been able to be quite as open about.  Remains supportive of all his family.  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  Is working to reduce the intensity of his anxiety at times which is already improving and he notices when he does talk openly, that helps  with his anxiety.  Focusing more now on what he can change versus the things he cannot change.  Taking time off from work at times just to get a break and he notices that does help his anxiety also.  Encouraged patient to keep working with his self-affirming behaviors as noted in sessions including working on these inside and outside of sessions: Stay in touch with supportive people, being more committed to ongoing work with his treatment goals, increase self-awareness and committed to personal health goals, take breaks as needed, positive self-talk and self-care, healthy nutrition and exercise, stay in the present focused on the things he can control versus cannot, allow his faith to be an emotional support as well as spiritual, continue making his family a high priority including himself, and realize the strength he shows when he works with goal-directed behaviors that help move him in her direction and supports his overall emotional health and outlook into the future.  [name] does recognize some of the progress he has made and he needs to continue working with goal-directed behaviors that enable him to keep moving in a more hopeful and healthier direction into the future.  Goal review and progress/challenges noted with patient.  Next appointment within 3 weeks.   Barnie Bunde, LCSW

## 2024-06-15 ENCOUNTER — Encounter (HOSPITAL_BASED_OUTPATIENT_CLINIC_OR_DEPARTMENT_OTHER): Payer: Self-pay | Admitting: Physical Therapy

## 2024-06-15 ENCOUNTER — Other Ambulatory Visit (HOSPITAL_BASED_OUTPATIENT_CLINIC_OR_DEPARTMENT_OTHER): Payer: Self-pay

## 2024-06-15 ENCOUNTER — Ambulatory Visit (HOSPITAL_BASED_OUTPATIENT_CLINIC_OR_DEPARTMENT_OTHER): Payer: Self-pay | Attending: Student | Admitting: Physical Therapy

## 2024-06-15 DIAGNOSIS — M5412 Radiculopathy, cervical region: Secondary | ICD-10-CM | POA: Diagnosis present

## 2024-06-15 DIAGNOSIS — M62838 Other muscle spasm: Secondary | ICD-10-CM | POA: Diagnosis present

## 2024-06-15 DIAGNOSIS — R29898 Other symptoms and signs involving the musculoskeletal system: Secondary | ICD-10-CM | POA: Insufficient documentation

## 2024-06-15 NOTE — Therapy (Signed)
 OUTPATIENT PHYSICAL THERAPY CERVICAL TREATMENT   Patient Name: Dustin Rice MRN: [id number] DOB:September 12, 1955, 68 y.o., male Today's Date: 06/15/2024  END OF SESSION:  PT End of Session - 06/15/24 0914     Visit Number 6    Number of Visits 12    Date for Recertification  07/02/24    Authorization Type MCR A&B    PT Start Time 0900    PT Stop Time 0943    PT Time Calculation (min) 43 min    Activity Tolerance Patient tolerated treatment well    Behavior During Therapy [facility] for tasks assessed/performed              Past Medical History:  Diagnosis Date   Allergic rhinitis    Cervical spine disease    Esophageal reflux    GERD (gastroesophageal reflux disease)    Gilbert's syndrome    Hypercholesterolemia    Hyperlipidemia    Inguinal hernia    RIGHT   Prostatitis    RBBB    Shingles 03/2014   Past Surgical History:  Procedure Laterality Date   HERNIA REPAIR  2004   Patient Active Problem List   Diagnosis Date Noted   Benign localized prostatic hyperplasia with lower urinary tract symptoms (LUTS) 04/05/2024   Chronic pain 04/05/2024   Epigastric pain 04/05/2024   Gastroesophageal reflux disease 04/05/2024   Hematochezia 04/05/2024   Hypercholesterolemia 04/05/2024   Internal hemorrhoids 04/05/2024   Intestinal malabsorption 04/05/2024   Lumbago with sciatica, left side 04/05/2024   Screen for colon cancer 04/05/2024   RBBB 07/14/2023   Dyslipidemia 07/14/2023   Elevated coronary artery calcium  score 07/14/2023   Spinal stenosis of lumbar region 05/13/2023   Spondylolisthesis at L4-L5 level 05/13/2023   Palpitations 05/12/2016     REFERRING PROVIDER: Emiliano Leonce CROME, PA-C  REFERRING DIAG: M54.12 (ICD-10-CM) - Radiculopathy, cervical region  THERAPY DIAG:  Radiculopathy, cervical region  Other muscle spasm  Other symptoms and signs involving the musculoskeletal system  Other signs and symptoms involving the musculoskeletal  system  Rationale for Evaluation and Treatment: Rehabilitation  ONSET DATE: PT order 05/14/24  SUBJECTIVE:                                                                                                                                                                                                         SUBJECTIVE STATEMENT: Pt states he felt good for 2 days and not so good for 2 days this week.  Pt states he didn't have pain for 2 days.  He did have N/T  during those 2 days though not as bad. Pt reports he drove his wife's car those 2 days which he may have better posture in than his truck.  Pt had 7/10 pain the past 2 nights  reports having numbness in proximal R UE.  Pt thinks his R UE is weaker than L.  He has increased pain in the evening with his worst pain at night.  Pt denies any adverse effects after prior treatment.  He reports compliance with HEP.  Pt returns to MD on 12/10   Hand dominance:  R hand dominant, though writes and eats with L hand  PERTINENT HISTORY:  Cervical anterolisthesis  Hx of chronic lumbar pain--spinal stenosis and Grade 1 anterolisthesis L3-L4 and L4-5.  He receives injections.   PAIN:  NPRS:  3/10 current, 9-10/10 worst, 1/10 best Location:  R sided cervical and upper thoracic Easing Factors: massaging/putting pressure on R UT, alternating tylenol and Ibuprofen, hot shower,  reaching Aggravating Factors:  at rest, tilting head down to look at phone, looking up  PRECAUTIONS: Other: cervical and lumbar anterolisthesis     WEIGHT BEARING RESTRICTIONS: No  FALLS:  Has patient fallen in last 6 months? 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   PLOF: Independent  PATIENT GOALS: decrease pain, return to PLOF, avoid surgery, improve  posture   OBJECTIVE:  Note: Objective measures were completed at Evaluation unless otherwise noted.  DIAGNOSTIC FINDINGS:  Cervical X rays: BONES: Diffuse cervical kyphosis. Fixed 3 mm anterolisthesis C2-C3, likely degenerative in nature. Incomplete segmentation C5-6 with rudimentary disc space.   DISCS AND DEGENERATIVE CHANGES: Disc space narrowing and endplate remodeling of C4-5 and C6-7 in keeping with changes of advanced degenerative disc disease. Moderate degenerative changes seen at C3-4. Asymmetric left facet arthrosis noted at C3-C4.   SOFT TISSUES: No prevertebral soft tissue swelling.   IMPRESSION: 1. Fixed 3 mm anterolisthesis C2-3, likely degenerative in nature. No abnormal motion with flexion and extension to suggest ligamentous instability. 2. Incomplete segmentation C5-6 with rudimentary disc space. 3. Disc space narrowing and endplate remodeling of C4-5 and C6-7, consistent with advanced degenerative disc disease. 4. Asymmetric left facet arthrosis at C3-C4, not well visualized. 5. Mild congenital narrowing of the spinal canal.  Shoulder x ray: FINDINGS: There is no evidence of fracture or dislocation. Mild acromioclavicular spurring. No erosive change or evidence of focal bone abnormality. Soft tissues are unremarkable.   IMPRESSION: Mild acromioclavicular spurring.  PATIENT SURVEYS:  NDI:  NECK DISABILITY INDEX  Date: 05/21/24 Score  Pain intensity 2 = The pain is moderate at the moment  2. Personal care (washing, dressing, etc.) 1 =  I can look after myself normally but it causes extra pain  3. Lifting 4 =  I can only lift very light weights  4. Reading 0 = I can read as much as I want to with no pain in my neck  5. Headaches 4 = I have severe headaches, which come frequently   6. Concentration 2 = I have a fair degree of difficulty in concentrating when I want to  7. Work 3 =  I cannot do my usual work  8. Driving 2 =  I can drive my car as long  as I want with moderate pain in my neck  9. Sleeping 1 = My sleep is slightly disturbed (less than 1 hr sleepless)  10. Recreation 1 =  I am able to engage in all my recreation activities, with some pain in my neck  Total 20/50   Minimum Detectable Change (90% confidence): 5 points or 10% points  COGNITION: Overall cognitive status: Within functional limits for tasks assessed   POSTURE: forward head  PALPATION: Pt felts spasms in R UE with palpation of trigger point in R UT.  Pt had tenderness with palpation of R UT > L UT.  He has soft tissue tightness in R > L UT.  Pt had tenderness in R sided cervical paraspinals.    CERVICAL ROM:   Active ROM A/PROM (deg) eval  Flexion 28 with pain  Extension   Right lateral flexion 30 with a little pain  Left lateral flexion 27  Right rotation 54 with a little pain  Left rotation 53   (Blank rows = not tested)  UPPER EXTREMITY ROM:  Active ROM Right eval Left eval  Shoulder flexion [facility] WFL and greater ROM on L  Shoulder extension    Shoulder abduction [facility] WFL and greater ROM on L  Shoulder adduction    Shoulder extension    Shoulder internal rotation    Shoulder external rotation    Elbow flexion    Elbow extension    Wrist flexion    Wrist extension    Wrist ulnar deviation    Wrist radial deviation    Wrist pronation    Wrist supination     (Blank rows = not tested)  UPPER EXTREMITY MMT:  MMT Right eval Left eval  Shoulder flexion 5/5 5/5  Shoulder extension    Shoulder abduction 5/5 5/5  Shoulder adduction    Shoulder extension    Shoulder internal rotation    Shoulder external rotation    Middle trapezius    Lower trapezius    Elbow flexion 5/5 5/5  Elbow extension 5/5 4+/5  Wrist flexion intact   Wrist extension intact   Wrist ulnar deviation    Wrist radial deviation    Wrist pronation    Wrist supination    Grip strength     (Blank rows = not tested)    TREATMENT:                                                                                                                                11/6 Manual Therapy: Reviewed pt presentation, respones to prior treatment, HEP compliance, and pain level.  Pt received STM and TPR to bilat UT R > L seated and to bilat suboccipitals, cervical paraspinals and UT R > L in supine with LE's elevated on wedge.  Neuro Re-ed: Supine rhythmic stab's at 90 deg flexion 2x30 sec bilat Supine shoulder ABC 2# x 1 rep bilat Standing rows with RTB 2x12  Standing shoulder extension with RTB 2x10  See below for  pt education.    10/31 Sensation:  2+ to LT t/o bilat UE dermatomes  Pt received STM and TPR to bilat UT R > L seated and to bilat cervical paraspinals, R > L in supine with LE's elevated on wedge.  Supine shoulder ABC x 1 rep bilat with 1# Standing rows with RTB 2x10-12  Standing shoulder extension with RTB 2x10  See below for pt education.   10/28 Manual:  Trigger point release to upper trap and cervical paraspinals  Reviewed self soft tissue mobilization areas Reviewed anatomy of muscle we are working and the reasoning behind the soft tissue mobilization   Neuro-re-ed: : wand flexion stretch 2 lbs 2x6 ( had numbness) x10 without numbness   Scap retraction 2x12 red  Shoulder extension 2x12 red   Attempted to advance to green band but patient was unable 2nd to numbness  Supine ABC no weight and 1 lb     PATIENT EDUCATION:  Education details: dx, HEP, rationale of interventions, relevant anatomy, appropriate response, sx response, importance of posture, POC, and prognosis.  PT answered pt's questions.  Person educated: Patient Education method: Explanation, Demonstration, Tactile cues, Verbal cues, and Handouts Education comprehension: verbalized understanding, returned demonstration, verbal cues required, tactile cues required, and needs further education  HOME EXERCISE PROGRAM: Access Code: [email or web address] URL:  [email or web address] Date: 05/21/2024 Prepared by: Mose Minerva  Exercises - Standing Backward Shoulder Rolls  - 2-3 x daily - 7 x weekly - 2 sets - 10 reps - Standing Scapular Retraction  - 2 x daily - 7 x weekly - 2 sets - 10 reps - 3 second  hold  ASSESSMENT:  CLINICAL IMPRESSION: Pt had 2 day this week when he didn't have pain.  He had N/T those days but not as bad.  Pt reports having significant pain the past 2 nights.  Pt reports having N/T when changing positions during treatment including supine to sit and sit to stand.  PT performed soft tissue work to cervical paraspinals, suboccipitals, and UT.  Pt was more challenged on R UE with supine rhythmic stab's than L UE.  He also reports it was easier in L UE to perform shoulder ABC's.  Pt continues to have sx's.  He responded well to treatment reporting no increased pain just soreness after treatment.  He denies any N/T after treatment.     OBJECTIVE IMPAIRMENTS: decreased ROM, decreased strength, hypomobility, impaired flexibility, and pain.   ACTIVITY LIMITATIONS: sleeping  PARTICIPATION LIMITATIONS:   PERSONAL FACTORS: Time since onset of injury/illness/exacerbation and 1 comorbidity: chronic lumbar pain are also affecting patient's functional outcome.   REHAB POTENTIAL: Good  CLINICAL DECISION MAKING: Stable/uncomplicated  EVALUATION COMPLEXITY: Low   GOALS:  SHORT TERM GOALS: Target date:  06/11/2024   Pt will be independent and compliant with HEP for improved pain, sx's, ROM, postural strength, and function. Baseline:  Goal status:  complaint. Progressing HEP 10/23  2.  Pt will report at least a 25% improvement in pain and sx's overall.  Baseline:  Goal status: no improvement yet 10/23  3.  Pt will demo improved cervical rotation AROM to be at least 60 deg bilat and Sb'ing to at least 35 deg bilat for improved stiffness and mobility.   Baseline:  Goal status: INITIAL    LONG TERM GOALS: Target  date: 07/02/2024  Pt will report at least a 70% improvement in pain and sx's overall.  Baseline:  Goal status: No change at this time 10/28  2.  Pt's  NDI will improve by at 5 points for a clinically significant improvement in self perceived disability and function.   Baseline:  Goal status: INITIAL  3.  Pt will sleep at least 5/7 nights per week without disturbed sleep from pain and/or radicular sx's.  Baseline:  Goal status: INITIAL  4.  Pt will demo improved soft tissue tightness in cervical paraspinals and UT including reduced trigger points in UT for reduced muscle tension and decreased pain. Baseline:  Goal status: INITIAL     PLAN:  PT FREQUENCY: 1-2x/week  PT DURATION: 6 weeks  PLANNED INTERVENTIONS: [id number]- PT Re-evaluation, 97750- Physical Performance Testing, 97110-Therapeutic exercises, 97530- Therapeutic activity, [id number]- Neuromuscular re-education, 97535- Self Care, [id number]- Manual therapy, [id number]- Aquatic Therapy, [id number]- Electrical stimulation (unattended), [id number]- Electrical stimulation (manual), [id number]- Ultrasound, [id number] (1-2 muscles), [id number] (3+ muscles)- Dry Needling, Patient/Family education, Taping, Joint mobilization, Cryotherapy, and Moist heat  PLAN FOR NEXT SESSION:  Postural stabilization and strengthening.  Cervical rotation AROM w/n pt tolerance.  STM and TPR to UT and cervical paraspinals.  Pt returns to MD on 12/10.   Leigh Minerva III PT, DPT 06/15/24 3:36 PM

## 2024-06-19 ENCOUNTER — Encounter (HOSPITAL_BASED_OUTPATIENT_CLINIC_OR_DEPARTMENT_OTHER): Payer: Self-pay | Admitting: Physical Therapy

## 2024-06-19 ENCOUNTER — Ambulatory Visit (HOSPITAL_BASED_OUTPATIENT_CLINIC_OR_DEPARTMENT_OTHER): Payer: Self-pay | Admitting: Physical Therapy

## 2024-06-19 DIAGNOSIS — M5412 Radiculopathy, cervical region: Secondary | ICD-10-CM

## 2024-06-19 DIAGNOSIS — R29898 Other symptoms and signs involving the musculoskeletal system: Secondary | ICD-10-CM

## 2024-06-19 DIAGNOSIS — M62838 Other muscle spasm: Secondary | ICD-10-CM

## 2024-06-19 NOTE — Therapy (Signed)
 OUTPATIENT PHYSICAL THERAPY CERVICAL TREATMENT   Patient Name: Dustin Rice MRN: [id number] DOB:07/11/1956, 68 y.o., male Today's Date: 06/19/2024  END OF SESSION:  PT End of Session - 06/19/24 0809     Visit Number 7    Number of Visits 12    Date for Recertification  07/02/24    Authorization Type MCR A&B    PT Start Time 0805    PT Stop Time 0846    PT Time Calculation (min) 41 min    Activity Tolerance Patient tolerated treatment well    Behavior During Therapy [facility] for tasks assessed/performed              Past Medical History:  Diagnosis Date   Allergic rhinitis    Cervical spine disease    Esophageal reflux    GERD (gastroesophageal reflux disease)    Gilbert's syndrome    Hypercholesterolemia    Hyperlipidemia    Inguinal hernia    RIGHT   Prostatitis    RBBB    Shingles 03/2014   Past Surgical History:  Procedure Laterality Date   HERNIA REPAIR  2004   Patient Active Problem List   Diagnosis Date Noted   Benign localized prostatic hyperplasia with lower urinary tract symptoms (LUTS) 04/05/2024   Chronic pain 04/05/2024   Epigastric pain 04/05/2024   Gastroesophageal reflux disease 04/05/2024   Hematochezia 04/05/2024   Hypercholesterolemia 04/05/2024   Internal hemorrhoids 04/05/2024   Intestinal malabsorption 04/05/2024   Lumbago with sciatica, left side 04/05/2024   Screen for colon cancer 04/05/2024   RBBB 07/14/2023   Dyslipidemia 07/14/2023   Elevated coronary artery calcium  score 07/14/2023   Spinal stenosis of lumbar region 05/13/2023   Spondylolisthesis at L4-L5 level 05/13/2023   Palpitations 05/12/2016     REFERRING PROVIDER: Emiliano Leonce CROME, PA-C  REFERRING DIAG: M54.12 (ICD-10-CM) - Radiculopathy, cervical region  THERAPY DIAG:  Radiculopathy, cervical region  Other muscle spasm  Other symptoms and signs involving the musculoskeletal system  Other signs and symptoms involving the musculoskeletal  system  Rationale for Evaluation and Treatment: Rehabilitation  ONSET DATE: PT order 05/14/24  SUBJECTIVE:                                                                                                                                                                                                         SUBJECTIVE STATEMENT: Pt states he felt good for a couple of days after prior treatment.  He feels worse this AM compared to the past couple of days.  Pt states he feels a  catch medial to scapula.  Pt reports compliance with HEP.  He sometimes has pain with HEP though not having N/T.  Pt denies any N/T currently.    Pt returns to MD on 12/10   Hand dominance:  R hand dominant, though writes and eats with L hand  PERTINENT HISTORY:  Cervical anterolisthesis  Hx of chronic lumbar pain--spinal stenosis and Grade 1 anterolisthesis L3-L4 and L4-5.  He receives injections.   PAIN:  NPRS:  3-4/10 current, 9-10/10 worst, 1/10 best Location:  R sided cervical and upper thoracic Easing Factors: massaging/putting pressure on R UT, alternating tylenol and Ibuprofen, hot shower,  reaching Aggravating Factors:  at rest, tilting head down to look at phone, looking up  PRECAUTIONS: Other: cervical and lumbar anterolisthesis     WEIGHT BEARING RESTRICTIONS: No  FALLS:  Has patient fallen in last 6 months? 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   PLOF: Independent  PATIENT GOALS: decrease pain, return to PLOF, avoid surgery, improve posture   OBJECTIVE:  Note: Objective measures were completed at Evaluation unless otherwise noted.  DIAGNOSTIC FINDINGS:  Cervical X rays: BONES: Diffuse cervical kyphosis. Fixed 3 mm anterolisthesis C2-C3, likely degenerative in  nature. Incomplete segmentation C5-6 with rudimentary disc space.   DISCS AND DEGENERATIVE CHANGES: Disc space narrowing and endplate remodeling of C4-5 and C6-7 in keeping with changes of advanced degenerative disc disease. Moderate degenerative changes seen at C3-4. Asymmetric left facet arthrosis noted at C3-C4.   SOFT TISSUES: No prevertebral soft tissue swelling.   IMPRESSION: 1. Fixed 3 mm anterolisthesis C2-3, likely degenerative in nature. No abnormal motion with flexion and extension to suggest ligamentous instability. 2. Incomplete segmentation C5-6 with rudimentary disc space. 3. Disc space narrowing and endplate remodeling of C4-5 and C6-7, consistent with advanced degenerative disc disease. 4. Asymmetric left facet arthrosis at C3-C4, not well visualized. 5. Mild congenital narrowing of the spinal canal.  Shoulder x ray: FINDINGS: There is no evidence of fracture or dislocation. Mild acromioclavicular spurring. No erosive change or evidence of focal bone abnormality. Soft tissues are unremarkable.   IMPRESSION: Mild acromioclavicular spurring.  PATIENT SURVEYS:  NDI:  NECK DISABILITY INDEX  Date: 05/21/24 Score  Pain intensity 2 = The pain is moderate at the moment  2. Personal care (washing, dressing, etc.) 1 =  I can look after myself normally but it causes extra pain  3. Lifting 4 =  I can only lift very light weights  4. Reading 0 = I can read as much as I want to with no pain in my neck  5. Headaches 4 = I have severe headaches, which come frequently   6. Concentration 2 = I have a fair degree of difficulty in concentrating when I want to  7. Work 3 =  I cannot do my usual work  8. Driving 2 =  I can drive my car as long as I want with moderate pain in my neck  9. Sleeping 1 = My sleep is slightly disturbed (less than 1 hr sleepless)  10. Recreation 1 =  I am able to engage in all my recreation activities, with some pain in my neck  Total 20/50   Minimum  Detectable Change (90% confidence): 5 points or 10% points  COGNITION: Overall  cognitive status: Within functional limits for tasks assessed   POSTURE: forward head  PALPATION: Pt felts spasms in R UE with palpation of trigger point in R UT.  Pt had tenderness with palpation of R UT > L UT.  He has soft tissue tightness in R > L UT.  Pt had tenderness in R sided cervical paraspinals.    CERVICAL ROM:   Active ROM A/PROM (deg) eval  Flexion 28 with pain  Extension   Right lateral flexion 30 with a little pain  Left lateral flexion 27  Right rotation 54 with a little pain  Left rotation 53   (Blank rows = not tested)  UPPER EXTREMITY ROM:  Active ROM Right eval Left eval  Shoulder flexion [facility] WFL and greater ROM on L  Shoulder extension    Shoulder abduction [facility] WFL and greater ROM on L  Shoulder adduction    Shoulder extension    Shoulder internal rotation    Shoulder external rotation    Elbow flexion    Elbow extension    Wrist flexion    Wrist extension    Wrist ulnar deviation    Wrist radial deviation    Wrist pronation    Wrist supination     (Blank rows = not tested)  UPPER EXTREMITY MMT:  MMT Right eval Left eval  Shoulder flexion 5/5 5/5  Shoulder extension    Shoulder abduction 5/5 5/5  Shoulder adduction    Shoulder extension    Shoulder internal rotation    Shoulder external rotation    Middle trapezius    Lower trapezius    Elbow flexion 5/5 5/5  Elbow extension 5/5 4+/5  Wrist flexion intact   Wrist extension intact   Wrist ulnar deviation    Wrist radial deviation    Wrist pronation    Wrist supination    Grip strength     (Blank rows = not tested)    TREATMENT:                                                                                                                               11/11 Manual Therapy: Reviewed pt presentation, respones to prior treatment, HEP compliance, and pain level.  Pt received STM and TPR to R UT  and medial scap mm seated and to bilat suboccipitals and cervical paraspinals in supine with LE's elevated on wedge.  Neuro Re-ed: Supine rhythmic stab's at 90 deg flexion 2x30 sec bilat Supine shoulder ABC 2# x 1 rep bilat Standing rows with RTB x12, GTB x10 Standing bilat shoulder ER with YTB 2x10  Standing shoulder horizontal abduction with YTB x10  11/6 Manual Therapy: Reviewed pt presentation, respones to prior treatment, HEP compliance, and pain level.  Pt received STM and TPR to bilat UT R > L seated and to bilat suboccipitals, cervical paraspinals and UT R > L in supine with LE's elevated on wedge.  Neuro Re-ed: Supine  rhythmic stab's at 90 deg flexion 2x30 sec bilat Supine shoulder ABC 2# x 1 rep bilat Standing rows with RTB 2x12  Standing shoulder extension with RTB 2x10  See below for pt education.    10/31 Sensation:  2+ to LT t/o bilat UE dermatomes  Pt received STM and TPR to bilat UT R > L seated and to bilat cervical paraspinals, R > L in supine with LE's elevated on wedge.  Supine shoulder ABC x 1 rep bilat with 1# Standing rows with RTB 2x10-12  Standing shoulder extension with RTB 2x10  See below for pt education.   10/28 Manual:  Trigger point release to upper trap and cervical paraspinals  Reviewed self soft tissue mobilization areas Reviewed anatomy of muscle we are working and the reasoning behind the soft tissue mobilization   Neuro-re-ed: : wand flexion stretch 2 lbs 2x6 ( had numbness) x10 without numbness   Scap retraction 2x12 red  Shoulder extension 2x12 red   Attempted to advance to green band but patient was unable 2nd to numbness  Supine ABC no weight and 1 lb     PATIENT EDUCATION:  Education details: dx, HEP, rationale of interventions, relevant anatomy, appropriate response, sx response, importance of posture, POC, and prognosis.  PT answered pt's questions.  Person educated: Patient Education method: Explanation, Demonstration,  Tactile cues, Verbal cues, and Handouts Education comprehension: verbalized understanding, returned demonstration, verbal cues required, tactile cues required, and needs further education  HOME EXERCISE PROGRAM: Access Code: [email or web address] URL: [email or web address] Date: 05/21/2024 Prepared by: Mose Minerva  Exercises - Standing Backward Shoulder Rolls  - 2-3 x daily - 7 x weekly - 2 sets - 10 reps - Standing Scapular Retraction  - 2 x daily - 7 x weekly - 2 sets - 10 reps - 3 second  hold  ASSESSMENT:  CLINICAL IMPRESSION: Pt continues to have N/T when changing positions during treatment including sit to stand which dissipates quickly.  He had decreased c/o's of N/T today.  PT performed soft tissue work to cervical paraspinals, suboccipitals, and UT.  PT progressed exercises and pt tolerated exercises well.  He was challenged with standing horizontal abduction.  He denies any N/T after treatment.     OBJECTIVE IMPAIRMENTS: decreased ROM, decreased strength, hypomobility, impaired flexibility, and pain.   ACTIVITY LIMITATIONS: sleeping  PARTICIPATION LIMITATIONS:   PERSONAL FACTORS: Time since onset of injury/illness/exacerbation and 1 comorbidity: chronic lumbar pain are also affecting patient's functional outcome.   REHAB POTENTIAL: Good  CLINICAL DECISION MAKING: Stable/uncomplicated  EVALUATION COMPLEXITY: Low   GOALS:  SHORT TERM GOALS: Target date:  06/11/2024   Pt will be independent and compliant with HEP for improved pain, sx's, ROM, postural strength, and function. Baseline:  Goal status:  complaint. Progressing HEP 10/23  2.  Pt will report at least a 25% improvement in pain and sx's overall.  Baseline:  Goal status: no improvement yet 10/23  3.  Pt will demo improved cervical rotation AROM to be at least 60 deg bilat and Sb'ing to at least 35 deg bilat for improved stiffness and mobility.   Baseline:  Goal status: INITIAL    LONG TERM GOALS:  Target date: 07/02/2024  Pt will report at least a 70% improvement in pain and sx's overall.  Baseline:  Goal status: No change at this time 10/28  2.  Pt's NDI will improve by at 5 points for a clinically significant improvement in self perceived disability and function.   Baseline:  Goal  status: INITIAL  3.  Pt will sleep at least 5/7 nights per week without disturbed sleep from pain and/or radicular sx's.  Baseline:  Goal status: INITIAL  4.  Pt will demo improved soft tissue tightness in cervical paraspinals and UT including reduced trigger points in UT for reduced muscle tension and decreased pain. Baseline:  Goal status: INITIAL     PLAN:  PT FREQUENCY: 1-2x/week  PT DURATION: 6 weeks  PLANNED INTERVENTIONS: [id number]- PT Re-evaluation, 97750- Physical Performance Testing, 97110-Therapeutic exercises, 97530- Therapeutic activity, [id number]- Neuromuscular re-education, 97535- Self Care, [id number]- Manual therapy, [id number]- Aquatic Therapy, [id number]- Electrical stimulation (unattended), [id number]- Electrical stimulation (manual), [id number]- Ultrasound, [id number] (1-2 muscles), [id number] (3+ muscles)- Dry Needling, Patient/Family education, Taping, Joint mobilization, Cryotherapy, and Moist heat  PLAN FOR NEXT SESSION:  Postural stabilization and strengthening.  Cervical rotation AROM w/n pt tolerance.  STM and TPR to UT and cervical paraspinals.  Pt returns to MD on 12/10.   Leigh Minerva III PT, DPT 06/20/24 11:05 AM

## 2024-06-21 ENCOUNTER — Ambulatory Visit (HOSPITAL_BASED_OUTPATIENT_CLINIC_OR_DEPARTMENT_OTHER): Admitting: Physical Therapy

## 2024-06-21 ENCOUNTER — Encounter (HOSPITAL_BASED_OUTPATIENT_CLINIC_OR_DEPARTMENT_OTHER): Payer: Self-pay | Admitting: Physical Therapy

## 2024-06-21 DIAGNOSIS — M5412 Radiculopathy, cervical region: Secondary | ICD-10-CM

## 2024-06-21 DIAGNOSIS — R29898 Other symptoms and signs involving the musculoskeletal system: Secondary | ICD-10-CM

## 2024-06-21 DIAGNOSIS — M62838 Other muscle spasm: Secondary | ICD-10-CM

## 2024-06-21 NOTE — Therapy (Signed)
 OUTPATIENT PHYSICAL THERAPY CERVICAL TREATMENT   Patient Name: Dustin Rice MRN: [id number] DOB:05/05/1956, 68 y.o., male Today's Date: 06/22/2024  END OF SESSION:  PT End of Session - 06/21/24 1026     Visit Number 8    Number of Visits 12    Date for Recertification  07/02/24    Authorization Type MCR A&B    PT Start Time 1024    PT Stop Time 1104    PT Time Calculation (min) 40 min    Activity Tolerance Patient tolerated treatment well    Behavior During Therapy [facility] for tasks assessed/performed              Past Medical History:  Diagnosis Date   Allergic rhinitis    Cervical spine disease    Esophageal reflux    GERD (gastroesophageal reflux disease)    Gilbert's syndrome    Hypercholesterolemia    Hyperlipidemia    Inguinal hernia    RIGHT   Prostatitis    RBBB    Shingles 03/2014   Past Surgical History:  Procedure Laterality Date   HERNIA REPAIR  2004   Patient Active Problem List   Diagnosis Date Noted   Benign localized prostatic hyperplasia with lower urinary tract symptoms (LUTS) 04/05/2024   Chronic pain 04/05/2024   Epigastric pain 04/05/2024   Gastroesophageal reflux disease 04/05/2024   Hematochezia 04/05/2024   Hypercholesterolemia 04/05/2024   Internal hemorrhoids 04/05/2024   Intestinal malabsorption 04/05/2024   Lumbago with sciatica, left side 04/05/2024   Screen for colon cancer 04/05/2024   RBBB 07/14/2023   Dyslipidemia 07/14/2023   Elevated coronary artery calcium  score 07/14/2023   Spinal stenosis of lumbar region 05/13/2023   Spondylolisthesis at L4-L5 level 05/13/2023   Palpitations 05/12/2016     REFERRING PROVIDER: Emiliano Leonce CROME, PA-C  REFERRING DIAG: M54.12 (ICD-10-CM) - Radiculopathy, cervical region  THERAPY DIAG:  Radiculopathy, cervical region  Other muscle spasm  Other symptoms and signs involving the musculoskeletal system  Other signs and symptoms involving the musculoskeletal  system  Rationale for Evaluation and Treatment: Rehabilitation  ONSET DATE: PT order 05/14/24  SUBJECTIVE:                                                                                                                                                                                                         SUBJECTIVE STATEMENT: Pt states he is feeling better today.  He reports having the same pain after prior treatment that he had before treatment.  Pt had soreness, but no increased pain.  Pt  had his lumbar injection on Tuesday and states his neck is feeling better, which may be from the medications.  Pt states he can feel a crackling with neck movement.  Pt denies any N/T currently.    Pt returns to MD on 12/10   Hand dominance:  R hand dominant, though writes and eats with L hand  PERTINENT HISTORY:  Cervical anterolisthesis  Hx of chronic lumbar pain--spinal stenosis and Grade 1 anterolisthesis L3-L4 and L4-5.  He receives injections.   PAIN:  NPRS:  1-2/10 current, 9-10/10 worst, 1/10 best Location:  R sided cervical and UT Easing Factors: massaging/putting pressure on R UT, alternating tylenol and Ibuprofen, hot shower,  reaching Aggravating Factors:  at rest, tilting head down to look at phone, looking up  PRECAUTIONS: Other: cervical and lumbar anterolisthesis     WEIGHT BEARING RESTRICTIONS: No  FALLS:  Has patient fallen in last 6 months? 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   PLOF: Independent  PATIENT GOALS: decrease pain, return to PLOF, avoid surgery, improve posture   OBJECTIVE:  Note: Objective measures were completed at Evaluation unless otherwise noted.  DIAGNOSTIC FINDINGS:  Cervical X rays: BONES: Diffuse cervical kyphosis.  Fixed 3 mm anterolisthesis C2-C3, likely degenerative in nature. Incomplete segmentation C5-6 with rudimentary disc space.   DISCS AND DEGENERATIVE CHANGES: Disc space narrowing and endplate remodeling of C4-5 and C6-7 in keeping with changes of advanced degenerative disc disease. Moderate degenerative changes seen at C3-4. Asymmetric left facet arthrosis noted at C3-C4.   SOFT TISSUES: No prevertebral soft tissue swelling.   IMPRESSION: 1. Fixed 3 mm anterolisthesis C2-3, likely degenerative in nature. No abnormal motion with flexion and extension to suggest ligamentous instability. 2. Incomplete segmentation C5-6 with rudimentary disc space. 3. Disc space narrowing and endplate remodeling of C4-5 and C6-7, consistent with advanced degenerative disc disease. 4. Asymmetric left facet arthrosis at C3-C4, not well visualized. 5. Mild congenital narrowing of the spinal canal.  Shoulder x ray: FINDINGS: There is no evidence of fracture or dislocation. Mild acromioclavicular spurring. No erosive change or evidence of focal bone abnormality. Soft tissues are unremarkable.   IMPRESSION: Mild acromioclavicular spurring.  PATIENT SURVEYS:  NDI:  NECK DISABILITY INDEX  Date: 05/21/24 Score  Pain intensity 2 = The pain is moderate at the moment  2. Personal care (washing, dressing, etc.) 1 =  I can look after myself normally but it causes extra pain  3. Lifting 4 =  I can only lift very light weights  4. Reading 0 = I can read as much as I want to with no pain in my neck  5. Headaches 4 = I have severe headaches, which come frequently   6. Concentration 2 = I have a fair degree of difficulty in concentrating when I want to  7. Work 3 =  I cannot do my usual work  8. Driving 2 =  I can drive my car as long as I want with moderate pain in my neck  9. Sleeping 1 = My sleep is slightly disturbed (less than 1 hr sleepless)  10. Recreation 1 =  I am able to engage in all my recreation  activities, with some pain in my neck  Total 20/50   Minimum Detectable Change (90% confidence):  5 points or 10% points  COGNITION: Overall cognitive status: Within functional limits for tasks assessed   POSTURE: forward head  PALPATION: Pt felts spasms in R UE with palpation of trigger point in R UT.  Pt had tenderness with palpation of R UT > L UT.  He has soft tissue tightness in R > L UT.  Pt had tenderness in R sided cervical paraspinals.    CERVICAL ROM:   Active ROM A/PROM (deg) eval  Flexion 28 with pain  Extension   Right lateral flexion 30 with a little pain  Left lateral flexion 27  Right rotation 54 with a little pain  Left rotation 53   (Blank rows = not tested)  UPPER EXTREMITY ROM:  Active ROM Right eval Left eval  Shoulder flexion [facility] WFL and greater ROM on L  Shoulder extension    Shoulder abduction [facility] WFL and greater ROM on L  Shoulder adduction    Shoulder extension    Shoulder internal rotation    Shoulder external rotation    Elbow flexion    Elbow extension    Wrist flexion    Wrist extension    Wrist ulnar deviation    Wrist radial deviation    Wrist pronation    Wrist supination     (Blank rows = not tested)  UPPER EXTREMITY MMT:  MMT Right eval Left eval  Shoulder flexion 5/5 5/5  Shoulder extension    Shoulder abduction 5/5 5/5  Shoulder adduction    Shoulder extension    Shoulder internal rotation    Shoulder external rotation    Middle trapezius    Lower trapezius    Elbow flexion 5/5 5/5  Elbow extension 5/5 4+/5  Wrist flexion intact   Wrist extension intact   Wrist ulnar deviation    Wrist radial deviation    Wrist pronation    Wrist supination    Grip strength     (Blank rows = not tested)    TREATMENT:                                                                                                                               11/13 Manual Therapy: Reviewed pt presentation, respones to prior treatment, HEP  compliance, and pain level.  Pt received STM and TPR to R UT and medial scap mm seated and to bilat suboccipitals and cervical paraspinals in supine with LE's elevated on wedge.  Supine rhythmic stab's at 90 deg flexion 3x30 sec bilat Supine shoulder ABC 2# x 1 rep bilat   11/11 Manual Therapy: Reviewed pt presentation, respones to prior treatment, HEP compliance, and pain level.  Pt received STM and TPR to R UT and medial scap mm seated and to bilat suboccipitals and cervical paraspinals in supine with LE's elevated on wedge.  Neuro Re-ed: Supine rhythmic stab's at 90 deg flexion 2x30 sec bilat Supine shoulder ABC 2# x 1 rep bilat Standing  rows with RTB x12, GTB x10 Standing bilat shoulder ER with YTB 2x10  Standing shoulder horizontal abduction with YTB x10  11/6 Manual Therapy: Reviewed pt presentation, respones to prior treatment, HEP compliance, and pain level.  Pt received STM and TPR to bilat UT R > L seated and to bilat suboccipitals, cervical paraspinals and UT R > L in supine with LE's elevated on wedge.  Neuro Re-ed: Supine rhythmic stab's at 90 deg flexion 2x30 sec bilat Supine shoulder ABC 2# x 1 rep bilat Standing rows with RTB 2x12  Standing shoulder extension with RTB 2x10  See below for pt education.    10/31 Sensation:  2+ to LT t/o bilat UE dermatomes  Pt received STM and TPR to bilat UT R > L seated and to bilat cervical paraspinals, R > L in supine with LE's elevated on wedge.  Supine shoulder ABC x 1 rep bilat with 1# Standing rows with RTB 2x10-12  Standing shoulder extension with RTB 2x10  See below for pt education.   10/28 Manual:  Trigger point release to upper trap and cervical paraspinals  Reviewed self soft tissue mobilization areas Reviewed anatomy of muscle we are working and the reasoning behind the soft tissue mobilization   Neuro-re-ed: : wand flexion stretch 2 lbs 2x6 ( had numbness) x10 without numbness   Scap retraction 2x12  red  Shoulder extension 2x12 red   Attempted to advance to green band but patient was unable 2nd to numbness  Supine ABC no weight and 1 lb     PATIENT EDUCATION:  Education details: dx, HEP, rationale of interventions, relevant anatomy, appropriate response, sx response, importance of posture, POC, and prognosis.  PT answered pt's questions.  Person educated: Patient Education method: Explanation, Demonstration, Tactile cues, Verbal cues, and Handouts Education comprehension: verbalized understanding, returned demonstration, verbal cues required, tactile cues required, and needs further education  HOME EXERCISE PROGRAM: Access Code: [email or web address] URL: [email or web address] Date: 05/21/2024 Prepared by: Mose Minerva  Exercises - Standing Backward Shoulder Rolls  - 2-3 x daily - 7 x weekly - 2 sets - 10 reps - Standing Scapular Retraction  - 2 x daily - 7 x weekly - 2 sets - 10 reps - 3 second  hold  ASSESSMENT:  CLINICAL IMPRESSION: Pt recently had a lumbar injection and reports he is feeling better in his neck also.  He presents to treatment denying any N/T.  PT performed soft tissue work to cervical paraspinals, suboccipitals, and UT.  PT decreased exercises today due to recent lumbar injection.  Pt tolerated treatment well.  Pt states he could feel it a little in his R UT though denied any N/T after treatment.  Pt should benefit from cont skilled PT to address ongoing goals and impairments and improve overall function.      OBJECTIVE IMPAIRMENTS: decreased ROM, decreased strength, hypomobility, impaired flexibility, and pain.   ACTIVITY LIMITATIONS: sleeping  PARTICIPATION LIMITATIONS:   PERSONAL FACTORS: Time since onset of injury/illness/exacerbation and 1 comorbidity: chronic lumbar pain are also affecting patient's functional outcome.   REHAB POTENTIAL: Good  CLINICAL DECISION MAKING: Stable/uncomplicated  EVALUATION COMPLEXITY: Low   GOALS:  SHORT  TERM GOALS: Target date:  06/11/2024   Pt will be independent and compliant with HEP for improved pain, sx's, ROM, postural strength, and function. Baseline:  Goal status:  complaint. Progressing HEP 10/23  2.  Pt will report at least a 25% improvement in pain and sx's overall.  Baseline:  Goal status: no improvement  yet 10/23  3.  Pt will demo improved cervical rotation AROM to be at least 60 deg bilat and Sb'ing to at least 35 deg bilat for improved stiffness and mobility.   Baseline:  Goal status: INITIAL    LONG TERM GOALS: Target date: 07/02/2024  Pt will report at least a 70% improvement in pain and sx's overall.  Baseline:  Goal status: No change at this time 10/28  2.  Pt's NDI will improve by at 5 points for a clinically significant improvement in self perceived disability and function.   Baseline:  Goal status: INITIAL  3.  Pt will sleep at least 5/7 nights per week without disturbed sleep from pain and/or radicular sx's.  Baseline:  Goal status: INITIAL  4.  Pt will demo improved soft tissue tightness in cervical paraspinals and UT including reduced trigger points in UT for reduced muscle tension and decreased pain. Baseline:  Goal status: INITIAL     PLAN:  PT FREQUENCY: 1-2x/week  PT DURATION: 6 weeks  PLANNED INTERVENTIONS: [id number]- PT Re-evaluation, 97750- Physical Performance Testing, 97110-Therapeutic exercises, 97530- Therapeutic activity, [id number]- Neuromuscular re-education, 97535- Self Care, [id number]- Manual therapy, [id number]- Aquatic Therapy, [id number]- Electrical stimulation (unattended), [id number]- Electrical stimulation (manual), [id number]- Ultrasound, [id number] (1-2 muscles), [id number] (3+ muscles)- Dry Needling, Patient/Family education, Taping, Joint mobilization, Cryotherapy, and Moist heat  PLAN FOR NEXT SESSION:  Postural stabilization and strengthening.  Cervical rotation AROM w/n pt tolerance.  STM and TPR to UT and cervical paraspinals.  Pt returns to MD on  12/10.   Leigh Minerva III PT, DPT 06/22/24 11:35 AM

## 2024-06-26 ENCOUNTER — Encounter (HOSPITAL_BASED_OUTPATIENT_CLINIC_OR_DEPARTMENT_OTHER): Payer: Self-pay | Admitting: Physical Therapy

## 2024-06-26 ENCOUNTER — Ambulatory Visit (HOSPITAL_BASED_OUTPATIENT_CLINIC_OR_DEPARTMENT_OTHER): Payer: Self-pay | Admitting: Physical Therapy

## 2024-06-26 DIAGNOSIS — M5412 Radiculopathy, cervical region: Secondary | ICD-10-CM

## 2024-06-26 DIAGNOSIS — R29898 Other symptoms and signs involving the musculoskeletal system: Secondary | ICD-10-CM

## 2024-06-26 DIAGNOSIS — M62838 Other muscle spasm: Secondary | ICD-10-CM

## 2024-06-26 NOTE — Therapy (Signed)
 OUTPATIENT PHYSICAL THERAPY CERVICAL TREATMENT   Patient Name: Dustin Rice MRN: [id number] DOB:21-Dec-1955, 68 y.o., male Today's Date: 06/27/2024  END OF SESSION:  PT End of Session - 06/26/24 1625     Visit Number 9    Number of Visits 12    Date for Recertification  07/02/24    Authorization Type MCR A&B    PT Start Time 1622    PT Stop Time 1706    PT Time Calculation (min) 44 min    Activity Tolerance Patient tolerated treatment well    Behavior During Therapy [facility] for tasks assessed/performed              Past Medical History:  Diagnosis Date   Allergic rhinitis    Cervical spine disease    Esophageal reflux    GERD (gastroesophageal reflux disease)    Gilbert's syndrome    Hypercholesterolemia    Hyperlipidemia    Inguinal hernia    RIGHT   Prostatitis    RBBB    Shingles 03/2014   Past Surgical History:  Procedure Laterality Date   HERNIA REPAIR  2004   Patient Active Problem List   Diagnosis Date Noted   Benign localized prostatic hyperplasia with lower urinary tract symptoms (LUTS) 04/05/2024   Chronic pain 04/05/2024   Epigastric pain 04/05/2024   Gastroesophageal reflux disease 04/05/2024   Hematochezia 04/05/2024   Hypercholesterolemia 04/05/2024   Internal hemorrhoids 04/05/2024   Intestinal malabsorption 04/05/2024   Lumbago with sciatica, left side 04/05/2024   Screen for colon cancer 04/05/2024   RBBB 07/14/2023   Dyslipidemia 07/14/2023   Elevated coronary artery calcium  score 07/14/2023   Spinal stenosis of lumbar region 05/13/2023   Spondylolisthesis at L4-L5 level 05/13/2023   Palpitations 05/12/2016     REFERRING PROVIDER: Emiliano Leonce CROME, PA-C  REFERRING DIAG: M54.12 (ICD-10-CM) - Radiculopathy, cervical region  THERAPY DIAG:  Radiculopathy, cervical region  Other muscle spasm  Other symptoms and signs involving the musculoskeletal system  Other signs and symptoms involving the musculoskeletal  system  Rationale for Evaluation and Treatment: Rehabilitation  ONSET DATE: PT order 05/14/24  SUBJECTIVE:                                                                                                                                                                                                         SUBJECTIVE STATEMENT: Pt states he is not having the pain in his R shoulder and UE that he was having.  He still feels the area in his UT proximal medial scap which feels  like catching.  Pt is only able to sleep in one position.  He has N/T if he tries to sleep in other positions.  His sx's are worse in the AM and get better t/o the day.   Pt denies any N/T currently.      Hand dominance:  R hand dominant, though writes and eats with L hand  PERTINENT HISTORY:  Cervical anterolisthesis  Hx of chronic lumbar pain--spinal stenosis and Grade 1 anterolisthesis L3-L4 and L4-5.  He receives injections.   PAIN:  NPRS:  1/10 current, 9-10/10 worst, 1/10 best Location:  R sided cervical/UT and proximal medial scap Easing Factors: massaging/putting pressure on R UT, alternating tylenol and Ibuprofen, hot shower,  reaching Aggravating Factors:  at rest, tilting head down to look at phone, looking up  PRECAUTIONS: Other: cervical and lumbar anterolisthesis     WEIGHT BEARING RESTRICTIONS: No  FALLS:  Has patient fallen in last 6 months? 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   PLOF: Independent  PATIENT GOALS: decrease pain, return to PLOF, avoid surgery, improve posture   OBJECTIVE:  Note: Objective measures were completed at Evaluation unless otherwise noted.  DIAGNOSTIC FINDINGS:  Cervical X rays: BONES: Diffuse cervical kyphosis. Fixed 3 mm  anterolisthesis C2-C3, likely degenerative in nature. Incomplete segmentation C5-6 with rudimentary disc space.   DISCS AND DEGENERATIVE CHANGES: Disc space narrowing and endplate remodeling of C4-5 and C6-7 in keeping with changes of advanced degenerative disc disease. Moderate degenerative changes seen at C3-4. Asymmetric left facet arthrosis noted at C3-C4.   SOFT TISSUES: No prevertebral soft tissue swelling.   IMPRESSION: 1. Fixed 3 mm anterolisthesis C2-3, likely degenerative in nature. No abnormal motion with flexion and extension to suggest ligamentous instability. 2. Incomplete segmentation C5-6 with rudimentary disc space. 3. Disc space narrowing and endplate remodeling of C4-5 and C6-7, consistent with advanced degenerative disc disease. 4. Asymmetric left facet arthrosis at C3-C4, not well visualized. 5. Mild congenital narrowing of the spinal canal.  Shoulder x ray: FINDINGS: There is no evidence of fracture or dislocation. Mild acromioclavicular spurring. No erosive change or evidence of focal bone abnormality. Soft tissues are unremarkable.   IMPRESSION: Mild acromioclavicular spurring.  PATIENT SURVEYS:  NDI:  NECK DISABILITY INDEX  Date: 05/21/24 Score  Pain intensity 2 = The pain is moderate at the moment  2. Personal care (washing, dressing, etc.) 1 =  I can look after myself normally but it causes extra pain  3. Lifting 4 =  I can only lift very light weights  4. Reading 0 = I can read as much as I want to with no pain in my neck  5. Headaches 4 = I have severe headaches, which come frequently   6. Concentration 2 = I have a fair degree of difficulty in concentrating when I want to  7. Work 3 =  I cannot do my usual work  8. Driving 2 =  I can drive my car as long as I want with moderate pain in my neck  9. Sleeping 1 = My sleep is slightly disturbed (less than 1 hr sleepless)  10. Recreation 1 =  I am able to engage in all my recreation activities, with  some pain in my neck  Total 20/50  Minimum Detectable Change (90% confidence): 5 points or 10% points  COGNITION: Overall cognitive status: Within functional limits for tasks assessed   POSTURE: forward head  PALPATION: Pt felts spasms in R UE with palpation of trigger point in R UT.  Pt had tenderness with palpation of R UT > L UT.  He has soft tissue tightness in R > L UT.  Pt had tenderness in R sided cervical paraspinals.    CERVICAL ROM:   Active ROM A/PROM (deg) eval  Flexion 28 with pain  Extension   Right lateral flexion 30 with a little pain  Left lateral flexion 27  Right rotation 54 with a little pain  Left rotation 53   (Blank rows = not tested)  UPPER EXTREMITY ROM:  Active ROM Right eval Left eval  Shoulder flexion [facility] WFL and greater ROM on L  Shoulder extension    Shoulder abduction [facility] WFL and greater ROM on L  Shoulder adduction    Shoulder extension    Shoulder internal rotation    Shoulder external rotation    Elbow flexion    Elbow extension    Wrist flexion    Wrist extension    Wrist ulnar deviation    Wrist radial deviation    Wrist pronation    Wrist supination     (Blank rows = not tested)  UPPER EXTREMITY MMT:  MMT Right eval Left eval  Shoulder flexion 5/5 5/5  Shoulder extension    Shoulder abduction 5/5 5/5  Shoulder adduction    Shoulder extension    Shoulder internal rotation    Shoulder external rotation    Middle trapezius    Lower trapezius    Elbow flexion 5/5 5/5  Elbow extension 5/5 4+/5  Wrist flexion intact   Wrist extension intact   Wrist ulnar deviation    Wrist radial deviation    Wrist pronation    Wrist supination    Grip strength     (Blank rows = not tested)    TREATMENT:                                                                                                                               11/18 Manual Therapy: Reviewed pt presentation, respones to prior treatment, HEP compliance, and  pain level.  Pt received STM and TPR to bilat UT and R medial scap mm seated and to bilat suboccipitals and cervical paraspinals and R UT in supine with LE's elevated on wedge.  Cervical AROM 2x10 bilat Standing rows with retraction with GTB 2x10 Standing shoulder extension with retraction 2x10 Bilat ER with retraction with YTB 2x10 Supine rhythmic stab's at 90 deg flexion 3x45 sec bilat   11/13 Manual Therapy: Reviewed pt presentation, respones to prior treatment, HEP compliance, and pain level.  Pt received STM and TPR to R UT and medial scap mm seated and to bilat suboccipitals and cervical paraspinals in supine with LE's  elevated on wedge.  Supine rhythmic stab's at 90 deg flexion 3x30 sec bilat Supine shoulder ABC 2# x 1 rep bilat   11/11 Manual Therapy: Reviewed pt presentation, respones to prior treatment, HEP compliance, and pain level.  Pt received STM and TPR to R UT and medial scap mm seated and to bilat suboccipitals and cervical paraspinals in supine with LE's elevated on wedge.  Neuro Re-ed: Supine rhythmic stab's at 90 deg flexion 2x30 sec bilat Supine shoulder ABC 2# x 1 rep bilat Standing rows with RTB x12, GTB x10 Standing bilat shoulder ER with YTB 2x10  Standing shoulder horizontal abduction with YTB x10  11/6 Manual Therapy: Reviewed pt presentation, respones to prior treatment, HEP compliance, and pain level.  Pt received STM and TPR to bilat UT R > L seated and to bilat suboccipitals, cervical paraspinals and UT R > L in supine with LE's elevated on wedge.  Neuro Re-ed: Supine rhythmic stab's at 90 deg flexion 2x30 sec bilat Supine shoulder ABC 2# x 1 rep bilat Standing rows with RTB 2x12  Standing shoulder extension with RTB 2x10  See below for pt education.     PATIENT EDUCATION:  Education details: dx, HEP, rationale of interventions, relevant anatomy, appropriate response, sx response, importance of posture, POC, and prognosis.  PT answered  pt's questions.  Person educated: Patient Education method: Explanation, Demonstration, Tactile cues, Verbal cues, and Handouts Education comprehension: verbalized understanding, returned demonstration, verbal cues required, tactile cues required, and needs further education  HOME EXERCISE PROGRAM: Access Code: [email or web address] URL: [email or web address] Date: 05/21/2024 Prepared by: Dustin Minerva  Exercises - Standing Backward Shoulder Rolls  - 2-3 x daily - 7 x weekly - 2 sets - 10 reps - Standing Scapular Retraction  - 2 x daily - 7 x weekly - 2 sets - 10 reps - 3 second  hold  ASSESSMENT:  CLINICAL IMPRESSION: Pt continues to have pain and sx's which feels like catching in his UT and proximal medial scap mm though states he is not having the pain in his R shoulder and UE that he was having.  Pt presents to treatment without N/T.  Pt states he can sleep without N/T in one position though has N/T if he tries to sleep in other positions.   PT performed soft tissue work to cervical paraspinals, suboccipitals, medial scap mm, and UT to improve soft tissue tightness and mobility and pain and to reduce myofascial restrictions and trigger points.  Pt tolerated exercises and manual therapy well.  Pt does have some N/T with bed mobility when getting out of the supine position.  He had no c/o's after treatment and continued to deny any N/T after treatment.  PT to assess cervical AROM and goals next visit.     OBJECTIVE IMPAIRMENTS: decreased ROM, decreased strength, hypomobility, impaired flexibility, and pain.   ACTIVITY LIMITATIONS: sleeping  PARTICIPATION LIMITATIONS:   PERSONAL FACTORS: Time since onset of injury/illness/exacerbation and 1 comorbidity: chronic lumbar pain are also affecting patient's functional outcome.   REHAB POTENTIAL: Good  CLINICAL DECISION MAKING: Stable/uncomplicated  EVALUATION COMPLEXITY: Low   GOALS:  SHORT TERM GOALS: Target date:   06/11/2024   Pt will be independent and compliant with HEP for improved pain, sx's, ROM, postural strength, and function. Baseline:  Goal status:  complaint. Progressing HEP 10/23  2.  Pt will report at least a 25% improvement in pain and sx's overall.  Baseline:  Goal status: no improvement yet 10/23  3.  Pt will  demo improved cervical rotation AROM to be at least 60 deg bilat and Sb'ing to at least 35 deg bilat for improved stiffness and mobility.   Baseline:  Goal status: INITIAL    LONG TERM GOALS: Target date: 07/02/2024  Pt will report at least a 70% improvement in pain and sx's overall.  Baseline:  Goal status: No change at this time 10/28  2.  Pt's NDI will improve by at 5 points for a clinically significant improvement in self perceived disability and function.   Baseline:  Goal status: INITIAL  3.  Pt will sleep at least 5/7 nights per week without disturbed sleep from pain and/or radicular sx's.  Baseline:  Goal status: INITIAL  4.  Pt will demo improved soft tissue tightness in cervical paraspinals and UT including reduced trigger points in UT for reduced muscle tension and decreased pain. Baseline:  Goal status: INITIAL     PLAN:  PT FREQUENCY: 1-2x/week  PT DURATION: 6 weeks  PLANNED INTERVENTIONS: [id number]- PT Re-evaluation, 97750- Physical Performance Testing, 97110-Therapeutic exercises, 97530- Therapeutic activity, [id number]- Neuromuscular re-education, 97535- Self Care, [id number]- Manual therapy, [id number]- Aquatic Therapy, [id number]- Electrical stimulation (unattended), [id number]- Electrical stimulation (manual), [id number]- Ultrasound, [id number] (1-2 muscles), [id number] (3+ muscles)- Dry Needling, Patient/Family education, Taping, Joint mobilization, Cryotherapy, and Moist heat  PLAN FOR NEXT SESSION:  Postural stabilization and strengthening.  Cervical rotation AROM w/n pt tolerance.  STM and TPR to UT and cervical paraspinals.  Pt returns to MD on 12/10.  PN next visit.    Leigh Minerva Rice PT, DPT 06/27/24 10:36 PM

## 2024-06-28 ENCOUNTER — Ambulatory Visit: Admitting: Podiatry

## 2024-06-28 ENCOUNTER — Ambulatory Visit (HOSPITAL_BASED_OUTPATIENT_CLINIC_OR_DEPARTMENT_OTHER): Admitting: Physical Therapy

## 2024-06-28 ENCOUNTER — Encounter: Payer: Self-pay | Admitting: Podiatry

## 2024-06-28 ENCOUNTER — Encounter (HOSPITAL_BASED_OUTPATIENT_CLINIC_OR_DEPARTMENT_OTHER): Payer: Self-pay | Admitting: Physical Therapy

## 2024-06-28 DIAGNOSIS — B353 Tinea pedis: Secondary | ICD-10-CM

## 2024-06-28 DIAGNOSIS — M62838 Other muscle spasm: Secondary | ICD-10-CM

## 2024-06-28 DIAGNOSIS — B351 Tinea unguium: Secondary | ICD-10-CM | POA: Diagnosis not present

## 2024-06-28 DIAGNOSIS — M5412 Radiculopathy, cervical region: Secondary | ICD-10-CM | POA: Diagnosis not present

## 2024-06-28 DIAGNOSIS — R29898 Other symptoms and signs involving the musculoskeletal system: Secondary | ICD-10-CM

## 2024-06-28 MED ORDER — TRIAMCINOLONE ACETONIDE 0.1 % EX CREA: 1.0000 | TOPICAL_CREAM | Freq: Two times a day (BID) | CUTANEOUS | 3 refills | Status: AC

## 2024-06-28 NOTE — Therapy (Signed)
 OUTPATIENT PHYSICAL THERAPY CERVICAL TREATMENT   Patient Name: Dustin Rice MRN: [id number] DOB:May 19, 1956, 68 y.o., male Today's Date: 06/29/2024  END OF SESSION:  PT End of Session - 06/28/24 0941     Visit Number 10    Number of Visits 19    Date for Recertification  08/02/24    Authorization Type MCR A&B    PT Start Time 0939    PT Stop Time 1025    PT Time Calculation (min) 46 min    Activity Tolerance Patient tolerated treatment well    Behavior During Therapy [facility] for tasks assessed/performed              Past Medical History:  Diagnosis Date   Allergic rhinitis    Cervical spine disease    Esophageal reflux    GERD (gastroesophageal reflux disease)    Gilbert's syndrome    Hypercholesterolemia    Hyperlipidemia    Inguinal hernia    RIGHT   Prostatitis    RBBB    Shingles 03/2014   Past Surgical History:  Procedure Laterality Date   HERNIA REPAIR  2004   Patient Active Problem List   Diagnosis Date Noted   Benign localized prostatic hyperplasia with lower urinary tract symptoms (LUTS) 04/05/2024   Chronic pain 04/05/2024   Epigastric pain 04/05/2024   Gastroesophageal reflux disease 04/05/2024   Hematochezia 04/05/2024   Hypercholesterolemia 04/05/2024   Internal hemorrhoids 04/05/2024   Intestinal malabsorption 04/05/2024   Lumbago with sciatica, left side 04/05/2024   Screen for colon cancer 04/05/2024   RBBB 07/14/2023   Dyslipidemia 07/14/2023   Elevated coronary artery calcium  score 07/14/2023   Spinal stenosis of lumbar region 05/13/2023   Spondylolisthesis at L4-L5 level 05/13/2023   Palpitations 05/12/2016     REFERRING PROVIDER: Emiliano Leonce CROME, PA-C  REFERRING DIAG: M54.12 (ICD-10-CM) - Radiculopathy, cervical region  THERAPY DIAG:  Radiculopathy, cervical region  Other muscle spasm  Other symptoms and signs involving the musculoskeletal system  Other signs and symptoms involving the musculoskeletal  system  Rationale for Evaluation and Treatment: Rehabilitation  ONSET DATE: PT order 05/14/24  SUBJECTIVE:                                                                                                                                                                                                         SUBJECTIVE STATEMENT: Pt states he felt great when he got this AM.  He went back to sleep and had significant sx's (6-7/10 pain) when he got up the 2nd time.  He states  he must have been in a different position.  Overall I'm doing better.  Pt reports his improvement varies.  Pt reports 50-60% improvement in pain and sx's overall.  His sx's are worse in the AM and get better t/o the day.  Pt is not having the pain in his R shoulder and UE that he was having.  He still feels the area in his UT proximal medial scap which feels like catching.  Pt is only able to sleep on his R side; he has N/T in any other position.    Pt denies any N/T currently.      Hand dominance:  R hand dominant, though writes and eats with L hand  PERTINENT HISTORY:  Cervical anterolisthesis  Hx of chronic lumbar pain--spinal stenosis and Grade 1 anterolisthesis L3-L4 and L4-5.  He receives injections.   PAIN:  NPRS:  3/10 current, 9-10/10 worst, 1/10 best Location:  R sided cervical/UT and proximal medial scap, R shoulder  Pt denies any N/T though did have some earlier.  Easing Factors: massaging/putting pressure on R UT, alternating tylenol and Ibuprofen, hot shower,  reaching Aggravating Factors:  at rest, tilting head down to look at phone, looking up  PRECAUTIONS: Other: cervical and lumbar anterolisthesis     WEIGHT BEARING RESTRICTIONS: No  FALLS:  Has patient fallen in last 6 months? 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   PLOF: Independent  PATIENT GOALS: decrease pain, return to PLOF, avoid surgery, improve posture   OBJECTIVE:  Note: Objective measures were completed at Evaluation unless otherwise noted.  DIAGNOSTIC FINDINGS:  Cervical X rays: BONES: Diffuse cervical kyphosis. Fixed 3 mm anterolisthesis C2-C3, likely degenerative in nature. Incomplete segmentation C5-6 with rudimentary disc space.   DISCS AND DEGENERATIVE CHANGES: Disc space narrowing and endplate remodeling of C4-5 and C6-7 in keeping with changes of advanced degenerative disc disease. Moderate degenerative changes seen at C3-4. Asymmetric left facet arthrosis noted at C3-C4.   SOFT TISSUES: No prevertebral soft tissue swelling.   IMPRESSION: 1. Fixed 3 mm anterolisthesis C2-3, likely degenerative in nature. No abnormal motion with flexion and extension to suggest ligamentous instability. 2. Incomplete segmentation C5-6 with rudimentary disc space. 3. Disc space narrowing and endplate remodeling of C4-5 and C6-7, consistent with advanced degenerative disc disease. 4. Asymmetric left facet arthrosis at C3-C4, not well visualized. 5. Mild congenital narrowing of the spinal canal.  Shoulder x ray: FINDINGS: There is no evidence of fracture or dislocation. Mild acromioclavicular spurring. No erosive change or evidence of focal bone abnormality. Soft tissues are unremarkable.   IMPRESSION: Mild acromioclavicular spurring.  PATIENT SURVEYS:  NDI:  NECK DISABILITY INDEX   Date: 05/21/24 Score 06/28/24  Pain intensity 2 = The pain is moderate at the moment 1  2. Personal care (washing, dressing, etc.) 1 =  I can look after myself normally but it causes extra pain 1  3. Lifting 4 =  I can only lift very light weights 5  4. Reading 0 = I can read as much as I want to with no pain in my neck 1  5. Headaches 4 = I have severe headaches, which come frequently  0  6. Concentration  2 = I have a fair degree of difficulty in concentrating when I want  to 1  7. Work 3 =  I cannot do my usual work 1  8. Driving 2 =  I can drive my car as long as I want with moderate pain in my neck 1  9. Sleeping 1 = My sleep is slightly disturbed (less than 1 hr sleepless) 2  10. Recreation 1 =  I am able to engage in all my recreation activities, with some pain in my neck 3  Total 20/50 16/50   Minimum Detectable Change (90% confidence): 5 points or 10% points  COGNITION: Overall cognitive status: Within functional limits for tasks assessed   POSTURE: forward head  PALPATION: Pt felts spasms in R UE with palpation of trigger point in R UT.  Pt had tenderness with palpation of R UT > L UT.  He has soft tissue tightness in R > L UT.  Pt had tenderness in R sided cervical paraspinals.    CERVICAL ROM:   Active ROM A/PROM (deg) eval AROM 11/20  Flexion 28 with pain 30 deg with N/T  Extension    Right lateral flexion 30 with a little pain 33 with a little pain  Left lateral flexion 27 26  Right rotation 54 with a little pain 51  Left rotation 53 60   (Blank rows = not tested)  UPPER EXTREMITY ROM:  Active ROM Right eval Left eval  Shoulder flexion [facility] WFL and greater ROM on L  Shoulder extension    Shoulder abduction [facility] WFL and greater ROM on L  Shoulder adduction    Shoulder extension    Shoulder internal rotation    Shoulder external rotation    Elbow flexion    Elbow extension    Wrist flexion    Wrist extension    Wrist ulnar deviation    Wrist radial deviation    Wrist pronation    Wrist supination     (Blank rows = not tested)  UPPER EXTREMITY MMT:  MMT Right eval Left eval  Shoulder flexion 5/5 5/5  Shoulder extension    Shoulder abduction 5/5 5/5  Shoulder adduction    Shoulder extension    Shoulder internal rotation    Shoulder external rotation    Middle trapezius    Lower trapezius    Elbow flexion 5/5 5/5  Elbow extension 5/5 4+/5  Wrist  flexion intact   Wrist extension intact   Wrist ulnar deviation    Wrist radial deviation    Wrist pronation    Wrist supination    Grip strength     (Blank rows = not tested)    TREATMENT:                                                                                                                               11/20 UBE x 3 mins PT assessed cervical AROM.  See above. Cervical AROM 2x10 bilat Standing rows with retraction with GTB 2x10 Standing shoulder extension  with retraction with RTB 2x10 Bilat ER with retraction with YTB 2x10 Standing horizontal abd with YTB 2x10   Pt received STM and TPR to bilat UT and R medial scap mm seated   11/18 Manual Therapy: Reviewed pt presentation, respones to prior treatment, HEP compliance, and pain level.  Pt received STM and TPR to bilat UT and R medial scap mm seated and to bilat suboccipitals and cervical paraspinals and R UT in supine with LE's elevated on wedge.  Cervical AROM 2x10 bilat Standing rows with retraction with GTB 2x10 Standing shoulder extension with retraction 2x10 Bilat ER with retraction with YTB 2x10 Supine rhythmic stab's at 90 deg flexion 3x45 sec bilat   11/13 Manual Therapy: Reviewed pt presentation, respones to prior treatment, HEP compliance, and pain level.  Pt received STM and TPR to R UT and medial scap mm seated and to bilat suboccipitals and cervical paraspinals in supine with LE's elevated on wedge.  Supine rhythmic stab's at 90 deg flexion 3x30 sec bilat Supine shoulder ABC 2# x 1 rep bilat   11/11 Manual Therapy: Reviewed pt presentation, respones to prior treatment, HEP compliance, and pain level.  Pt received STM and TPR to R UT and medial scap mm seated and to bilat suboccipitals and cervical paraspinals in supine with LE's elevated on wedge.  Neuro Re-ed: Supine rhythmic stab's at 90 deg flexion 2x30 sec bilat Supine shoulder ABC 2# x 1 rep bilat Standing rows with RTB x12, GTB  x10 Standing bilat shoulder ER with YTB 2x10  Standing shoulder horizontal abduction with YTB x10  11/6 Manual Therapy: Reviewed pt presentation, respones to prior treatment, HEP compliance, and pain level.  Pt received STM and TPR to bilat UT R > L seated and to bilat suboccipitals, cervical paraspinals and UT R > L in supine with LE's elevated on wedge.  Neuro Re-ed: Supine rhythmic stab's at 90 deg flexion 2x30 sec bilat Supine shoulder ABC 2# x 1 rep bilat Standing rows with RTB 2x12  Standing shoulder extension with RTB 2x10  See below for pt education.     PATIENT EDUCATION:  Education details: dx, HEP, rationale of interventions, relevant anatomy, appropriate response, sx response, importance of posture, POC, and prognosis.  PT answered pt's questions.  Person educated: Patient Education method: Explanation, Demonstration, Tactile cues, Verbal cues, and Handouts Education comprehension: verbalized understanding, returned demonstration, verbal cues required, tactile cues required, and needs further education  HOME EXERCISE PROGRAM: Access Code: [email or web address] URL: [email or web address] Date: 05/21/2024 Prepared by: Mose Minerva  Exercises - Standing Backward Shoulder Rolls  - 2-3 x daily - 7 x weekly - 2 sets - 10 reps - Standing Scapular Retraction  - 2 x daily - 7 x weekly - 2 sets - 10 reps - 3 second  hold  ASSESSMENT:  CLINICAL IMPRESSION: Pt reports 50-60% improvement in pain and sx's overall.  Pt states he is not having the pain in his R shoulder and UE that he was having, but still feels the area in his UT proximal medial scap.  He has disturbed sleep and can only sleep in one position.  His sx's are worst in the AM.  Pt had minimal changes in cervical AROM, some improved and some worse.  Pt demonstrates improved L cervical rotation AROM by 7 deg.  Pt continues to have soft tissue tightness in UT, medial scap mm, and cervical paraspinals though is  improving.  Pt demonstrates improved self perceived disability with NDI improving from 20 initially  to 16 currently.  Pt met STG's #1,2 and partially met STG #3 and LTG #1.  He should benefit from continues skilled PT to address ongoing goals and impairments and assist in restoring desired level of function.    OBJECTIVE IMPAIRMENTS: decreased ROM, decreased strength, hypomobility, impaired flexibility, and pain.   ACTIVITY LIMITATIONS: sleeping  PARTICIPATION LIMITATIONS:   PERSONAL FACTORS: Time since onset of injury/illness/exacerbation and 1 comorbidity: chronic lumbar pain are also affecting patient's functional outcome.   REHAB POTENTIAL: Good  CLINICAL DECISION MAKING: Stable/uncomplicated  EVALUATION COMPLEXITY: Low   GOALS:  SHORT TERM GOALS: Target date:  06/11/2024   Pt will be independent and compliant with HEP for improved pain, sx's, ROM, postural strength, and function. Baseline:  Goal status:  GOAL MET  11/20  2.  Pt will report at least a 25% improvement in pain and sx's overall.  Baseline:  Goal status: GOAL MET   11/20  3.  Pt will demo improved cervical rotation AROM to be at least 60 deg bilat and Sb'ing to at least 35 deg bilat for improved stiffness and mobility.   Baseline:  Goal status: 25% MET    LONG TERM GOALS: Target date: 08/02/2024  Pt will report at least a 70% improvement in pain and sx's overall.  Baseline:  Goal status:  PARTIALLY MET  11/20  2.  Pt's NDI will improve by at 5 points for a clinically significant improvement in self perceived disability and function.   Baseline:  Goal status: INITIAL  3.  Pt will sleep at least 5/7 nights per week without disturbed sleep from pain and/or radicular sx's.  Baseline:  Goal status: ONGOING  4.  Pt will demo improved soft tissue tightness in cervical paraspinals and UT including reduced trigger points in UT for reduced muscle tension and decreased pain. Baseline:  Goal status:  INITIAL     PLAN:  PT FREQUENCY: 1-2x/week  PT DURATION: 4-5 weeks  PLANNED INTERVENTIONS: [id number]- PT Re-evaluation, 97750- Physical Performance Testing, 97110-Therapeutic exercises, 97530- Therapeutic activity, [id number]- Neuromuscular re-education, 97535- Self Care, [id number]- Manual therapy, [id number]- Aquatic Therapy, [id number]- Electrical stimulation (unattended), [id number]- Electrical stimulation (manual), [id number]- Ultrasound, [id number] (1-2 muscles), [id number] (3+ muscles)- Dry Needling, Patient/Family education, Taping, Joint mobilization, Cryotherapy, and Moist heat  PLAN FOR NEXT SESSION:  Postural stabilization and strengthening.  Cervical rotation AROM w/n pt tolerance.  STM and TPR to UT and cervical paraspinals.  Pt returns to MD on 12/10.     Leigh Minerva III PT, DPT 06/29/24 11:21 AM

## 2024-06-29 ENCOUNTER — Ambulatory Visit: Payer: Self-pay | Admitting: Internal Medicine

## 2024-06-29 LAB — NMR, LIPOPROFILE
Cholesterol, Total: 101 mg/dL (ref 100–199)
HDL Particle Number: 37.9 umol/L (ref >=30.5)
HDL-C: 53 mg/dL (ref >39)
LDL Particle Number: <300 nmol/L (ref <1000)
LDL Size: 19.9 nm — ABNORMAL LOW (ref >20.5)
LDL-C (NIH Calc): 30 mg/dL (ref 0–99)
LP-IR Score: 52 — ABNORMAL HIGH (ref <=45)
Small LDL Particle Number: 158 nmol/L (ref <=527)
Triglycerides: 92 mg/dL (ref 0–149)

## 2024-07-02 ENCOUNTER — Ambulatory Visit (INDEPENDENT_AMBULATORY_CARE_PROVIDER_SITE_OTHER): Admitting: Psychiatry

## 2024-07-02 ENCOUNTER — Ambulatory Visit (HOSPITAL_BASED_OUTPATIENT_CLINIC_OR_DEPARTMENT_OTHER): Payer: Self-pay | Admitting: Physical Therapy

## 2024-07-02 DIAGNOSIS — F411 Generalized anxiety disorder: Secondary | ICD-10-CM | POA: Diagnosis not present

## 2024-07-02 NOTE — Progress Notes (Signed)
 He presents today for follow-up of his fungus and tinea also his matrixectomy.  He states that is finally getting better.  Objective: Vital signs are stable alert oriented x 3.  Pulses are palpable.  There is mild paronychia where the nail is growing out.  I removed that the little shard of nail today no purulence no malodor feet are doing much better at this time.  Assessment: Well-healing tinea well-healing onychomycosis.  As well as well-healing paronychia.  Plan: Follow-up with me as needed continue current therapies.

## 2024-07-02 NOTE — Progress Notes (Signed)
 [facility] Counselor/Therapist Progress Note  Patient ID: Dustin Rice, MRN: [id number],    Date: 07/02/2024  Time Spent: 55 minutes   Treatment Type: Individual Therapy  Reported Symptoms: anxious, family stressors, frustrations  Mental Status Exam:  Appearance:   Casual and Neat     Behavior:  Appropriate, Sharing, and Motivated  Motor:  Normal  Speech/Language:   Clear and Coherent  Affect:  anxious  Mood:  anxious  Thought process:  goal directed  Thought content:    WNL  Sensory/Perceptual disturbances:    WNL  Orientation:  oriented to person, place, time/date, situation, day of week, month of year, year, and stated date of Nov. 24, 2025  Attention:  Good  Concentration:  Good  Memory:  WNL  Fund of knowledge:   Good  Insight:    Good  Judgment:   Good  Impulse Control:  Good   Risk Assessment: Danger to Self:  No Self-injurious Behavior: No Danger to Others: No Duty to Warn:no Physical Aggression / Violence:No  Access to Firearms a concern: No Gang Involvement:No   Subjective:   Patient in session today and reporting symptoms of anxiety, stress, some worrying, and supporting his elderly mother with her physical/emotional challenges. Has encountered a lot of stress recently, including within his family/marriage. Patient today sharing more about recently more stressful and some hurtful interactions within the family. (Not all details included in this note due to patient privacy needs.) Concerns about health issues with elderly mother and his wife. Wife's diabetes still problematic. Worrying more recently especially re: family interactions. Working today on better managing his anger and frustration especially within the family. Overseeing the decisions for his elderly mother and her caretakers, and that seems to be going some better. Trying to manage lots of family stressors discussed in session today.   Interventions: Cognitive Behavioral Therapy,  Solution-Oriented/Positive Psychology, and Ego-Supportive Long term goal: Reduce overall level, frequency, and intensity of anxiety and depression so that daily functioning is not impaired. Short term goal: Increase understanding of beliefs and messages that produce worry and anxiety. Strategies: Identify and use specific coping strategies for anxiety and depression reduction.  Also explore alternative ways of communicating better with spouse and being more sensitive to needs within the family, including both wife and son.    Diagnosis:   ICD-10-CM   1. Generalized anxiety disorder  F41.1      Plan:  Patient working further today on better managing his stress, frustrations, and concerns mostly related to himself personally and family/extended family.  Worked really diligently in session today confronting difficult issues.  Aging mother continues to have health concerns.  Looking forward to Thanksgiving with his family.  Continues to have some concerns for his son into the future based on certain choices.  (Not all details included in this note due to patient privacy needs.  Regardless of various concerns, patient does remain very supportive of all of his family and continues to try and have frequent contact with them.  Looking at what he can change versus what he cannot change and is out of his control, particularly with other people and loved ones. Encouraged and supported patient to keep working on his self affirming behaviors as noted in sessions including inside and outside of sessions: Stay in touch with supportive people, being more committed to ongoing work with his treatment goals, increase self-awareness and commitment to personal health goals, take breaks as needed, positive self-talk and self-care, healthy nutrition  and exercise, stay in the present focused on the things he can control versus cannot, allow his faith to be an emotional support as well as spiritual, continue making his family  a high priority including himself, and realize the strength he shows when working with goal-directed behaviors that help move him in a positive direction and supports his overall emotional health and outlook into the future.  Dustin Rice has recognize some of the progress he has made and needs to continue working with goal-directed behaviors enabling him to keep moving in a more hopeful and healthier direction going forward.  Goal review and progress/challenges noted with patient.  Next appointment within 3 weeks.   Barnie Bunde, LCSW

## 2024-07-03 ENCOUNTER — Encounter (HOSPITAL_BASED_OUTPATIENT_CLINIC_OR_DEPARTMENT_OTHER): Payer: Self-pay | Admitting: Physical Therapy

## 2024-07-03 ENCOUNTER — Ambulatory Visit (HOSPITAL_BASED_OUTPATIENT_CLINIC_OR_DEPARTMENT_OTHER): Admitting: Physical Therapy

## 2024-07-03 DIAGNOSIS — M62838 Other muscle spasm: Secondary | ICD-10-CM

## 2024-07-03 DIAGNOSIS — R29898 Other symptoms and signs involving the musculoskeletal system: Secondary | ICD-10-CM

## 2024-07-03 DIAGNOSIS — M5412 Radiculopathy, cervical region: Secondary | ICD-10-CM

## 2024-07-03 NOTE — Therapy (Signed)
 OUTPATIENT PHYSICAL THERAPY CERVICAL TREATMENT   Patient Name: Dustin Rice MRN: [id number] DOB:[birth year], 68 y.o., male Today's Date: 07/03/2024  END OF SESSION:  PT End of Session - 07/03/24 1519     Visit Number 11    Number of Visits 19    Date for Recertification  08/02/24    Authorization Type MCR A&B    PT Start Time 1515    PT Stop Time 1555    PT Time Calculation (min) 40 min    Activity Tolerance Patient tolerated treatment well    Behavior During Therapy [facility] for tasks assessed/performed              Past Medical History:  Diagnosis Date   Allergic rhinitis    Cervical spine disease    Esophageal reflux    GERD (gastroesophageal reflux disease)    Gilbert's syndrome    Hypercholesterolemia    Hyperlipidemia    Inguinal hernia    RIGHT   Prostatitis    RBBB    Shingles 03/2014   Past Surgical History:  Procedure Laterality Date   HERNIA REPAIR  2004   Patient Active Problem List   Diagnosis Date Noted   Benign localized prostatic hyperplasia with lower urinary tract symptoms (LUTS) 04/05/2024   Chronic pain 04/05/2024   Epigastric pain 04/05/2024   Gastroesophageal reflux disease 04/05/2024   Hematochezia 04/05/2024   Hypercholesterolemia 04/05/2024   Internal hemorrhoids 04/05/2024   Intestinal malabsorption 04/05/2024   Lumbago with sciatica, left side 04/05/2024   Screen for colon cancer 04/05/2024   RBBB 07/14/2023   Dyslipidemia 07/14/2023   Elevated coronary artery calcium  score 07/14/2023   Spinal stenosis of lumbar region 05/13/2023   Spondylolisthesis at L4-L5 level 05/13/2023   Palpitations 05/12/2016     REFERRING PROVIDER: Emiliano Leonce CROME, PA-C  REFERRING DIAG: M54.12 (ICD-10-CM) - Radiculopathy, cervical region  THERAPY DIAG:  No diagnosis found.  Other signs and symptoms involving the musculoskeletal system  Rationale for Evaluation and Treatment: Rehabilitation  ONSET DATE: PT order 05/14/24  SUBJECTIVE:                                                                                                                                                                                                          SUBJECTIVE STATEMENT: The patient reports overall he is doing a little better. He is still having significant pain at night. .      Hand dominance:  R hand dominant, though writes and eats with L hand  PERTINENT HISTORY:  Cervical anterolisthesis  Hx  of chronic lumbar pain--spinal stenosis and Grade 1 anterolisthesis L3-L4 and L4-5.  He receives injections.   PAIN:  NPRS:  3/10 current, 9-10/10 worst, 1/10 best Location:  R sided cervical/UT and proximal medial scap, R shoulder  Pt denies any N/T though did have some earlier.  Easing Factors: massaging/putting pressure on R UT, alternating tylenol and Ibuprofen, hot shower,  reaching Aggravating Factors:  at rest, tilting head down to look at phone, looking up  PRECAUTIONS: Other: cervical and lumbar anterolisthesis     WEIGHT BEARING RESTRICTIONS: No  FALLS:  Has patient fallen in last 6 months? 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   PLOF: Independent  PATIENT GOALS: decrease pain, return to PLOF, avoid surgery, improve posture   OBJECTIVE:  Note: Objective measures were completed at Evaluation unless otherwise noted.  DIAGNOSTIC FINDINGS:  Cervical X rays: BONES: Diffuse cervical kyphosis. Fixed 3 mm anterolisthesis C2-C3, likely degenerative in nature. Incomplete segmentation C5-6 with rudimentary disc space.   DISCS AND DEGENERATIVE CHANGES: Disc space narrowing and endplate remodeling of C4-5 and C6-7 in keeping with changes of advanced degenerative disc disease. Moderate degenerative  changes seen at C3-4. Asymmetric left facet arthrosis noted at C3-C4.   SOFT TISSUES: No prevertebral soft tissue swelling.   IMPRESSION: 1. Fixed 3 mm anterolisthesis C2-3, likely degenerative in nature. No abnormal motion with flexion and extension to suggest ligamentous instability. 2. Incomplete segmentation C5-6 with rudimentary disc space. 3. Disc space narrowing and endplate remodeling of C4-5 and C6-7, consistent with advanced degenerative disc disease. 4. Asymmetric left facet arthrosis at C3-C4, not well visualized. 5. Mild congenital narrowing of the spinal canal.  Shoulder x ray: FINDINGS: There is no evidence of fracture or dislocation. Mild acromioclavicular spurring. No erosive change or evidence of focal bone abnormality. Soft tissues are unremarkable.   IMPRESSION: Mild acromioclavicular spurring.  PATIENT SURVEYS:  NDI:  NECK DISABILITY INDEX   Date: 05/21/24 Score 06/28/24  Pain intensity 2 = The pain is moderate at the moment 1  2. Personal care (washing, dressing, etc.) 1 =  I can look after myself normally but it causes extra pain 1  3. Lifting 4 =  I can only lift very light weights 5  4. Reading 0 = I can read as much as I want to with no pain in my neck 1  5. Headaches 4 = I have severe headaches, which come frequently  0  6. Concentration 2 = I have a fair degree of difficulty in concentrating when I want to 1  7. Work 3 =  I cannot do my usual work 1  8. Driving 2 =  I can drive my car as long as I want with moderate pain in my neck 1  9. Sleeping 1 = My sleep is slightly disturbed (less than 1 hr sleepless) 2  10. Recreation 1 =  I am able to engage in all my recreation activities, with some pain in my neck 3  Total 20/50 16/50   Minimum Detectable Change (90% confidence): 5 points or 10% points  COGNITION: Overall cognitive status: Within functional limits for tasks assessed   POSTURE: forward head  PALPATION: Pt felts spasms in R UE with  palpation of trigger point in R UT.  Pt had  tenderness with palpation of R UT > L UT.  He has soft tissue tightness in R > L UT.  Pt had tenderness in R sided cervical paraspinals.    CERVICAL ROM:   Active ROM A/PROM (deg) eval AROM 11/20  Flexion 28 with pain 30 deg with N/T  Extension    Right lateral flexion 30 with a little pain 33 with a little pain  Left lateral flexion 27 26  Right rotation 54 with a little pain 51  Left rotation 53 60   (Blank rows = not tested)  UPPER EXTREMITY ROM:  Active ROM Right eval Left eval  Shoulder flexion [facility] WFL and greater ROM on L  Shoulder extension    Shoulder abduction [facility] WFL and greater ROM on L  Shoulder adduction    Shoulder extension    Shoulder internal rotation    Shoulder external rotation    Elbow flexion    Elbow extension    Wrist flexion    Wrist extension    Wrist ulnar deviation    Wrist radial deviation    Wrist pronation    Wrist supination     (Blank rows = not tested)  UPPER EXTREMITY MMT:  MMT Right eval Left eval  Shoulder flexion 5/5 5/5  Shoulder extension    Shoulder abduction 5/5 5/5  Shoulder adduction    Shoulder extension    Shoulder internal rotation    Shoulder external rotation    Middle trapezius    Lower trapezius    Elbow flexion 5/5 5/5  Elbow extension 5/5 4+/5  Wrist flexion intact   Wrist extension intact   Wrist ulnar deviation    Wrist radial deviation    Wrist pronation    Wrist supination    Grip strength     (Blank rows = not tested)    TREATMENT:                                                                                                                               11/25 Manual:  Seated: trigger point release to upper traps using IASTYM bilateral  Trigger point release to cervical spine   Neuro re-ed:  Bilateral ER 3x10 Horizontal abduction yellow 3x10   Row 3x10 red  Shoulder extension 3x10 red  Cervical rotation 2x10  All with cuing for range and  posture   11/20 UBE x 3 mins PT assessed cervical AROM.  See above. Cervical AROM 2x10 bilat Standing rows with retraction with GTB 2x10 Standing shoulder extension with retraction with RTB 2x10 Bilat ER with retraction with YTB 2x10 Standing horizontal abd with YTB 2x10   Pt received STM and TPR to bilat UT and R medial scap mm seated   11/18 Manual Therapy: Reviewed pt presentation, respones to prior treatment, HEP compliance, and pain level.  Pt received STM and TPR to bilat UT and R medial scap mm seated and to bilat suboccipitals and cervical  paraspinals and R UT in supine with LE's elevated on wedge.  Cervical AROM 2x10 bilat Standing rows with retraction with GTB 2x10 Standing shoulder extension with retraction 2x10 Bilat ER with retraction with YTB 2x10 Supine rhythmic stab's at 90 deg flexion 3x45 sec bilat   11/13 Manual Therapy: Reviewed pt presentation, respones to prior treatment, HEP compliance, and pain level.  Pt received STM and TPR to R UT and medial scap mm seated and to bilat suboccipitals and cervical paraspinals in supine with LE's elevated on wedge.  Supine rhythmic stab's at 90 deg flexion 3x30 sec bilat Supine shoulder ABC 2# x 1 rep bilat   11/11 Manual Therapy: Reviewed pt presentation, respones to prior treatment, HEP compliance, and pain level.  Pt received STM and TPR to R UT and medial scap mm seated and to bilat suboccipitals and cervical paraspinals in supine with LE's elevated on wedge.  Neuro Re-ed: Supine rhythmic stab's at 90 deg flexion 2x30 sec bilat Supine shoulder ABC 2# x 1 rep bilat Standing rows with RTB x12, GTB x10 Standing bilat shoulder ER with YTB 2x10  Standing shoulder horizontal abduction with YTB x10  11/6 Manual Therapy: Reviewed pt presentation, respones to prior treatment, HEP compliance, and pain level.  Pt received STM and TPR to bilat UT R > L seated and to bilat suboccipitals, cervical paraspinals and UT R >  L in supine with LE's elevated on wedge.  Neuro Re-ed: Supine rhythmic stab's at 90 deg flexion 2x30 sec bilat Supine shoulder ABC 2# x 1 rep bilat Standing rows with RTB 2x12  Standing shoulder extension with RTB 2x10  See below for pt education.     PATIENT EDUCATION:  Education details: dx, HEP, rationale of interventions, relevant anatomy, appropriate response, sx response, importance of posture, POC, and prognosis.  PT answered pt's questions.  Person educated: Patient Education method: Explanation, Demonstration, Tactile cues, Verbal cues, and Handouts Education comprehension: verbalized understanding, returned demonstration, verbal cues required, tactile cues required, and needs further education  HOME EXERCISE PROGRAM: Access Code: [email or web address] URL: [email or web address] Date: 05/21/2024 Prepared by: Mose Minerva  Exercises - Standing Backward Shoulder Rolls  - 2-3 x daily - 7 x weekly - 2 sets - 10 reps - Standing Scapular Retraction  - 2 x daily - 7 x weekly - 2 sets - 10 reps - 3 second  hold  ASSESSMENT:  CLINICAL IMPRESSION: Patient reported that supine manual therapy can cause pain in his neck We performed manual therapy from a siting position today.  He tolerated well, although he did report some numbness. He tolerated postural correction exercises well. We will continue to progress as tolerated.   OBJECTIVE IMPAIRMENTS: decreased ROM, decreased strength, hypomobility, impaired flexibility, and pain.   ACTIVITY LIMITATIONS: sleeping  PARTICIPATION LIMITATIONS:   PERSONAL FACTORS: Time since onset of injury/illness/exacerbation and 1 comorbidity: chronic lumbar pain are also affecting patient's functional outcome.   REHAB POTENTIAL: Good  CLINICAL DECISION MAKING: Stable/uncomplicated  EVALUATION COMPLEXITY: Low   GOALS:  SHORT TERM GOALS: Target date:  06/11/2024   Pt will be independent and compliant with HEP for improved pain, sx's, ROM,  postural strength, and function. Baseline:  Goal status:  GOAL MET  11/20  2.  Pt will report at least a 25% improvement in pain and sx's overall.  Baseline:  Goal status: GOAL MET   11/20  3.  Pt will demo improved cervical rotation AROM to be at least 60 deg bilat and Sb'ing to at least  35 deg bilat for improved stiffness and mobility.   Baseline:  Goal status: 25% MET    LONG TERM GOALS: Target date: 08/02/2024  Pt will report at least a 70% improvement in pain and sx's overall.  Baseline:  Goal status:  PARTIALLY MET  11/20  2.  Pt's NDI will improve by at 5 points for a clinically significant improvement in self perceived disability and function.   Baseline:  Goal status: INITIAL  3.  Pt will sleep at least 5/7 nights per week without disturbed sleep from pain and/or radicular sx's.  Baseline:  Goal status: ONGOING  4.  Pt will demo improved soft tissue tightness in cervical paraspinals and UT including reduced trigger points in UT for reduced muscle tension and decreased pain. Baseline:  Goal status: INITIAL     PLAN:  PT FREQUENCY: 1-2x/week  PT DURATION: 4-5 weeks  PLANNED INTERVENTIONS: [id number]- PT Re-evaluation, 97750- Physical Performance Testing, 97110-Therapeutic exercises, 97530- Therapeutic activity, [id number]- Neuromuscular re-education, 97535- Self Care, [id number]- Manual therapy, [id number]- Aquatic Therapy, [id number]- Electrical stimulation (unattended), [id number]- Electrical stimulation (manual), [id number]- Ultrasound, [id number] (1-2 muscles), [id number] (3+ muscles)- Dry Needling, Patient/Family education, Taping, Joint mobilization, Cryotherapy, and Moist heat  PLAN FOR NEXT SESSION:  Postural stabilization and strengthening.  Cervical rotation AROM w/n pt tolerance.  STM and TPR to UT and cervical paraspinals.  Pt returns to MD on 12/10.     Alm Don PT DPT 07/03/24 4:34 PM

## 2024-07-04 ENCOUNTER — Ambulatory Visit (HOSPITAL_BASED_OUTPATIENT_CLINIC_OR_DEPARTMENT_OTHER): Admitting: Physical Therapy

## 2024-07-04 DIAGNOSIS — M5412 Radiculopathy, cervical region: Secondary | ICD-10-CM

## 2024-07-04 DIAGNOSIS — R29898 Other symptoms and signs involving the musculoskeletal system: Secondary | ICD-10-CM

## 2024-07-04 DIAGNOSIS — M62838 Other muscle spasm: Secondary | ICD-10-CM

## 2024-07-04 NOTE — Therapy (Signed)
 OUTPATIENT PHYSICAL THERAPY CERVICAL TREATMENT   Patient Name: Dustin Rice MRN: [id number] DOB:25-Jul-1956, 68 y.o., male Today's Date: 07/05/2024  END OF SESSION:  PT End of Session - 07/04/24 1628     Visit Number 12    Number of Visits 19    Date for Recertification  08/02/24    Authorization Type MCR A&B    PT Start Time 1544    PT Stop Time 1626    PT Time Calculation (min) 42 min    Activity Tolerance Patient tolerated treatment well    Behavior During Therapy [facility] for tasks assessed/performed               Past Medical History:  Diagnosis Date   Allergic rhinitis    Cervical spine disease    Esophageal reflux    GERD (gastroesophageal reflux disease)    Gilbert's syndrome    Hypercholesterolemia    Hyperlipidemia    Inguinal hernia    RIGHT   Prostatitis    RBBB    Shingles 03/2014   Past Surgical History:  Procedure Laterality Date   HERNIA REPAIR  2004   Patient Active Problem List   Diagnosis Date Noted   Benign localized prostatic hyperplasia with lower urinary tract symptoms (LUTS) 04/05/2024   Chronic pain 04/05/2024   Epigastric pain 04/05/2024   Gastroesophageal reflux disease 04/05/2024   Hematochezia 04/05/2024   Hypercholesterolemia 04/05/2024   Internal hemorrhoids 04/05/2024   Intestinal malabsorption 04/05/2024   Lumbago with sciatica, left side 04/05/2024   Screen for colon cancer 04/05/2024   RBBB 07/14/2023   Dyslipidemia 07/14/2023   Elevated coronary artery calcium  score 07/14/2023   Spinal stenosis of lumbar region 05/13/2023   Spondylolisthesis at L4-L5 level 05/13/2023   Palpitations 05/12/2016     REFERRING PROVIDER: Emiliano Leonce CROME, PA-C  REFERRING DIAG: M54.12 (ICD-10-CM) - Radiculopathy, cervical region  THERAPY DIAG:  Radiculopathy, cervical region  Other muscle spasm  Other symptoms and signs involving the musculoskeletal system  Other signs and symptoms involving the musculoskeletal  system  Rationale for Evaluation and Treatment: Rehabilitation  ONSET DATE: PT order 05/14/24  SUBJECTIVE:                                                                                                                                                                                                         SUBJECTIVE STATEMENT: Pt states he felt discomfort and some pain in bilat sides of lower lumbar last night and today.  He felt it worse last night.  Pt thinks it might be  from the standing shoulder extension exercise.  He did some of his prior PT exercises for his lumbar which helped.  Pt states he felt good after prior treatment.  Pt states he is doing a little better overall.  Pt is not waking up in the middle of the night as much in pain.  He is able to sleep on his back.   Pt fell onto his R side into the bushes when stepping over some ivy on Monday.  Pt denies any increased pain in his neck or arm.        Hand dominance:  R hand dominant, though writes and eats with L hand  PERTINENT HISTORY:  Cervical anterolisthesis  Hx of chronic lumbar pain--spinal stenosis and Grade 1 anterolisthesis L3-L4 and L4-5.  He receives injections.   PAIN:  NPRS:  1/10 current, 9-10/10 worst, 1/10 best Location:  R sided cervical/UT and superior proximal scap  Pt denies any N/T though did have some earlier.  Easing Factors: massaging/putting pressure on R UT, alternating tylenol and Ibuprofen, hot shower,  reaching Aggravating Factors:  at rest, tilting head down to look at phone, looking up  PRECAUTIONS: Other: cervical and lumbar anterolisthesis     WEIGHT BEARING RESTRICTIONS: No  FALLS:  Has patient fallen in last 6 months? 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    PLOF: Independent  PATIENT GOALS: decrease pain, return to PLOF, avoid surgery, improve posture   OBJECTIVE:  Note: Objective measures were completed at Evaluation unless otherwise noted.  DIAGNOSTIC FINDINGS:  Cervical X rays: BONES: Diffuse cervical kyphosis. Fixed 3 mm anterolisthesis C2-C3, likely degenerative in nature. Incomplete segmentation C5-6 with rudimentary disc space.   DISCS AND DEGENERATIVE CHANGES: Disc space narrowing and endplate remodeling of C4-5 and C6-7 in keeping with changes of advanced degenerative disc disease. Moderate degenerative changes seen at C3-4. Asymmetric left facet arthrosis noted at C3-C4.   SOFT TISSUES: No prevertebral soft tissue swelling.   IMPRESSION: 1. Fixed 3 mm anterolisthesis C2-3, likely degenerative in nature. No abnormal motion with flexion and extension to suggest ligamentous instability. 2. Incomplete segmentation C5-6 with rudimentary disc space. 3. Disc space narrowing and endplate remodeling of C4-5 and C6-7, consistent with advanced degenerative disc disease. 4. Asymmetric left facet arthrosis at C3-C4, not well visualized. 5. Mild congenital narrowing of the spinal canal.  Shoulder x ray: FINDINGS: There is no evidence of fracture or dislocation. Mild acromioclavicular spurring. No erosive change or evidence of focal bone abnormality. Soft tissues are unremarkable.   IMPRESSION: Mild acromioclavicular spurring.  PATIENT SURVEYS:  NDI:  NECK DISABILITY INDEX   Date: 05/21/24 Score 06/28/24  Pain intensity 2 = The pain is moderate at the moment 1  2. Personal care (washing, dressing, etc.) 1 =  I can look after myself normally but it causes extra pain 1  3. Lifting 4 =  I can only lift very light weights 5  4. Reading 0 = I can read as much as I want to with no pain in my neck 1  5. Headaches 4 = I have severe headaches, which come frequently  0  6. Concentration 2 = I have a fair degree of difficulty in  concentrating when I want to 1  7. Work 3 =  I  cannot do my usual work 1  8. Driving 2 =  I can drive my car as long as I want with moderate pain in my neck 1  9. Sleeping 1 = My sleep is slightly disturbed (less than 1 hr sleepless) 2  10. Recreation 1 =  I am able to engage in all my recreation activities, with some pain in my neck 3  Total 20/50 16/50   Minimum Detectable Change (90% confidence): 5 points or 10% points  COGNITION: Overall cognitive status: Within functional limits for tasks assessed   POSTURE: forward head  PALPATION: Pt felts spasms in R UE with palpation of trigger point in R UT.  Pt had tenderness with palpation of R UT > L UT.  He has soft tissue tightness in R > L UT.  Pt had tenderness in R sided cervical paraspinals.    CERVICAL ROM:   Active ROM A/PROM (deg) eval AROM 11/20  Flexion 28 with pain 30 deg with N/T  Extension    Right lateral flexion 30 with a little pain 33 with a little pain  Left lateral flexion 27 26  Right rotation 54 with a little pain 51  Left rotation 53 60   (Blank rows = not tested)  UPPER EXTREMITY ROM:  Active ROM Right eval Left eval  Shoulder flexion [facility] WFL and greater ROM on L  Shoulder extension    Shoulder abduction [facility] WFL and greater ROM on L  Shoulder adduction    Shoulder extension    Shoulder internal rotation    Shoulder external rotation    Elbow flexion    Elbow extension    Wrist flexion    Wrist extension    Wrist ulnar deviation    Wrist radial deviation    Wrist pronation    Wrist supination     (Blank rows = not tested)  UPPER EXTREMITY MMT:  MMT Right eval Left eval  Shoulder flexion 5/5 5/5  Shoulder extension    Shoulder abduction 5/5 5/5  Shoulder adduction    Shoulder extension    Shoulder internal rotation    Shoulder external rotation    Middle trapezius    Lower trapezius    Elbow flexion 5/5 5/5  Elbow extension 5/5 4+/5  Wrist flexion intact   Wrist extension intact    Wrist ulnar deviation    Wrist radial deviation    Wrist pronation    Wrist supination    Grip strength     (Blank rows = not tested)    TREATMENT:                                                                                                                               11/26 UBE lvl 1 x 4 mins  STM and TPR to bilat UT and medial scap mm seated  Cervical rotation AROM 2x10 Supine rhythmic stab's x45 sec at 90 deg bilat, 2x30 sec  at 60 deg Standing hz abd with YTB 2x10 Bilat ER with YTB 2x10-12 Standing rows with RTB  11/25 Manual:  Seated: trigger point release to upper traps using IASTYM bilateral  Trigger point release to cervical spine   Neuro re-ed:  Bilateral ER 3x10 Horizontal abduction yellow 3x10   Row 3x10 red  Shoulder extension 3x10 red  Cervical rotation 2x10  All with cuing for range and posture   11/20 UBE x 3 mins PT assessed cervical AROM.  See above. Cervical AROM 2x10 bilat Standing rows with retraction with GTB 2x10 Standing shoulder extension with retraction with RTB 2x10 Bilat ER with retraction with YTB 2x10 Standing horizontal abd with YTB 2x10   Pt received STM and TPR to bilat UT and R medial scap mm seated   11/18 Manual Therapy: Reviewed pt presentation, respones to prior treatment, HEP compliance, and pain level.  Pt received STM and TPR to bilat UT and R medial scap mm seated and to bilat suboccipitals and cervical paraspinals and R UT in supine with LE's elevated on wedge.  Cervical AROM 2x10 bilat Standing rows with retraction with GTB 2x10 Standing shoulder extension with retraction 2x10 Bilat ER with retraction with YTB 2x10 Supine rhythmic stab's at 90 deg flexion 3x45 sec bilat   11/13 Manual Therapy: Reviewed pt presentation, respones to prior treatment, HEP compliance, and pain level.  Pt received STM and TPR to R UT and medial scap mm seated and to bilat suboccipitals and cervical paraspinals in supine with LE's  elevated on wedge.  Supine rhythmic stab's at 90 deg flexion 3x30 sec bilat Supine shoulder ABC 2# x 1 rep bilat   11/11 Manual Therapy: Reviewed pt presentation, respones to prior treatment, HEP compliance, and pain level.  Pt received STM and TPR to R UT and medial scap mm seated and to bilat suboccipitals and cervical paraspinals in supine with LE's elevated on wedge.  Neuro Re-ed: Supine rhythmic stab's at 90 deg flexion 2x30 sec bilat Supine shoulder ABC 2# x 1 rep bilat Standing rows with RTB x12, GTB x10 Standing bilat shoulder ER with YTB 2x10  Standing shoulder horizontal abduction with YTB x10  11/6 Manual Therapy: Reviewed pt presentation, respones to prior treatment, HEP compliance, and pain level.  Pt received STM and TPR to bilat UT R > L seated and to bilat suboccipitals, cervical paraspinals and UT R > L in supine with LE's elevated on wedge.  Neuro Re-ed: Supine rhythmic stab's at 90 deg flexion 2x30 sec bilat Supine shoulder ABC 2# x 1 rep bilat Standing rows with RTB 2x12  Standing shoulder extension with RTB 2x10  See below for pt education.     PATIENT EDUCATION:  Education details: dx, HEP, rationale of interventions, relevant anatomy, appropriate response, sx response, importance of posture, POC, and prognosis.  PT answered pt's questions.  Person educated: Patient Education method: Explanation, Demonstration, Tactile cues, Verbal cues, and Handouts Education comprehension: verbalized understanding, returned demonstration, verbal cues required, tactile cues required, and needs further education  HOME EXERCISE PROGRAM: Access Code: [email or web address] URL: [email or web address] Date: 05/21/2024 Prepared by: Mose Minerva  Exercises - Standing Backward Shoulder Rolls  - 2-3 x daily - 7 x weekly - 2 sets - 10 reps - Standing Scapular Retraction  - 2 x daily - 7 x weekly - 2 sets - 10 reps - 3 second  hold  ASSESSMENT:  CLINICAL IMPRESSION: Pt  presents to treatment stating he is doing a little better overall.  He  has improved sleeping and is able to sleep on his back now.  Patient reported that supine manual therapy can cause pain in his neck; we performed manual therapy from a siting position today.  Pt thinks the standing shoulder extension exercise can irritate his back some.  PT did not perform that exercise today.  Pt did perform supine rhythmic stab's in supine and he notes some N/T after getting up from the supine position which quickly resolved.  He responded well to treatment having no increased pain or sx's after treatment.  Pt denies any N/T when arriving to treatment and also when leaving treatment.    OBJECTIVE IMPAIRMENTS: decreased ROM, decreased strength, hypomobility, impaired flexibility, and pain.   ACTIVITY LIMITATIONS: sleeping  PARTICIPATION LIMITATIONS:   PERSONAL FACTORS: Time since onset of injury/illness/exacerbation and 1 comorbidity: chronic lumbar pain are also affecting patient's functional outcome.   REHAB POTENTIAL: Good  CLINICAL DECISION MAKING: Stable/uncomplicated  EVALUATION COMPLEXITY: Low   GOALS:  SHORT TERM GOALS: Target date:  06/11/2024   Pt will be independent and compliant with HEP for improved pain, sx's, ROM, postural strength, and function. Baseline:  Goal status:  GOAL MET  11/20  2.  Pt will report at least a 25% improvement in pain and sx's overall.  Baseline:  Goal status: GOAL MET   11/20  3.  Pt will demo improved cervical rotation AROM to be at least 60 deg bilat and Sb'ing to at least 35 deg bilat for improved stiffness and mobility.   Baseline:  Goal status: 25% MET    LONG TERM GOALS: Target date: 08/02/2024  Pt will report at least a 70% improvement in pain and sx's overall.  Baseline:  Goal status:  PARTIALLY MET  11/20  2.  Pt's NDI will improve by at 5 points for a clinically significant improvement in self perceived disability and function.   Baseline:   Goal status: INITIAL  3.  Pt will sleep at least 5/7 nights per week without disturbed sleep from pain and/or radicular sx's.  Baseline:  Goal status: ONGOING  4.  Pt will demo improved soft tissue tightness in cervical paraspinals and UT including reduced trigger points in UT for reduced muscle tension and decreased pain. Baseline:  Goal status: INITIAL     PLAN:  PT FREQUENCY: 1-2x/week  PT DURATION: 4-5 weeks  PLANNED INTERVENTIONS: [id number]- PT Re-evaluation, 97750- Physical Performance Testing, 97110-Therapeutic exercises, 97530- Therapeutic activity, [id number]- Neuromuscular re-education, 97535- Self Care, [id number]- Manual therapy, [id number]- Aquatic Therapy, [id number]- Electrical stimulation (unattended), [id number]- Electrical stimulation (manual), [id number]- Ultrasound, [id number] (1-2 muscles), [id number] (3+ muscles)- Dry Needling, Patient/Family education, Taping, Joint mobilization, Cryotherapy, and Moist heat  PLAN FOR NEXT SESSION:  Postural stabilization and strengthening.  Cervical rotation AROM w/n pt tolerance.  STM and TPR to UT and cervical paraspinals.  Pt returns to MD on 12/10.     Leigh Minerva III PT, DPT 07/05/24 8:47 AM

## 2024-07-05 ENCOUNTER — Encounter (HOSPITAL_BASED_OUTPATIENT_CLINIC_OR_DEPARTMENT_OTHER): Payer: Self-pay | Admitting: Physical Therapy

## 2024-07-11 ENCOUNTER — Ambulatory Visit (HOSPITAL_BASED_OUTPATIENT_CLINIC_OR_DEPARTMENT_OTHER): Attending: Student | Admitting: Physical Therapy

## 2024-07-11 ENCOUNTER — Encounter (HOSPITAL_BASED_OUTPATIENT_CLINIC_OR_DEPARTMENT_OTHER): Payer: Self-pay | Admitting: Physical Therapy

## 2024-07-11 DIAGNOSIS — R29898 Other symptoms and signs involving the musculoskeletal system: Secondary | ICD-10-CM | POA: Diagnosis present

## 2024-07-11 DIAGNOSIS — M5412 Radiculopathy, cervical region: Secondary | ICD-10-CM | POA: Insufficient documentation

## 2024-07-11 DIAGNOSIS — M6281 Muscle weakness (generalized): Secondary | ICD-10-CM | POA: Insufficient documentation

## 2024-07-11 DIAGNOSIS — M62838 Other muscle spasm: Secondary | ICD-10-CM | POA: Diagnosis present

## 2024-07-11 DIAGNOSIS — M5459 Other low back pain: Secondary | ICD-10-CM | POA: Insufficient documentation

## 2024-07-11 NOTE — Therapy (Signed)
 OUTPATIENT PHYSICAL THERAPY CERVICAL TREATMENT   Patient Name: Dustin Rice MRN: [id number] DOB:1956/07/28, 68 y.o., male Today's Date: 07/12/2024  END OF SESSION:  PT End of Session - 07/11/24 1559     Visit Number 13    Number of Visits 19    Date for Recertification  08/02/24    Authorization Type MCR A&B    PT Start Time 1543    PT Stop Time 1621    PT Time Calculation (min) 38 min    Activity Tolerance Patient tolerated treatment well    Behavior During Therapy [facility] for tasks assessed/performed                Past Medical History:  Diagnosis Date   Allergic rhinitis    Cervical spine disease    Esophageal reflux    GERD (gastroesophageal reflux disease)    Gilbert's syndrome    Hypercholesterolemia    Hyperlipidemia    Inguinal hernia    RIGHT   Prostatitis    RBBB    Shingles 03/2014   Past Surgical History:  Procedure Laterality Date   HERNIA REPAIR  2004   Patient Active Problem List   Diagnosis Date Noted   Benign localized prostatic hyperplasia with lower urinary tract symptoms (LUTS) 04/05/2024   Chronic pain 04/05/2024   Epigastric pain 04/05/2024   Gastroesophageal reflux disease 04/05/2024   Hematochezia 04/05/2024   Hypercholesterolemia 04/05/2024   Internal hemorrhoids 04/05/2024   Intestinal malabsorption 04/05/2024   Lumbago with sciatica, left side 04/05/2024   Screen for colon cancer 04/05/2024   RBBB 07/14/2023   Dyslipidemia 07/14/2023   Elevated coronary artery calcium  score 07/14/2023   Spinal stenosis of lumbar region 05/13/2023   Spondylolisthesis at L4-L5 level 05/13/2023   Palpitations 05/12/2016     REFERRING PROVIDER: Emiliano Leonce CROME, PA-C  REFERRING DIAG: M54.12 (ICD-10-CM) - Radiculopathy, cervical region  THERAPY DIAG:  Radiculopathy, cervical region  Other muscle spasm  Other symptoms and signs involving the musculoskeletal system  Other signs and symptoms involving the musculoskeletal  system  Rationale for Evaluation and Treatment: Rehabilitation  ONSET DATE: PT order 05/14/24  SUBJECTIVE:                                                                                                                                                                                                         SUBJECTIVE STATEMENT: Pt reports having N/T in sitting and lying some though feels it more when he gets up from a seated position.  He states it quickly goes away as he  moves around. Pt denies any adverse effects after prior treatment.       Hand dominance:  R hand dominant, though writes and eats with L hand  PERTINENT HISTORY:  Cervical anterolisthesis  Hx of chronic lumbar pain--spinal stenosis and Grade 1 anterolisthesis L3-L4 and L4-5.  He receives injections.   PAIN:  NPRS:  1/10 current, 9-10/10 worst, 1/10 best Location:  R sided cervical/UT and superior proximal scap  Pt denies any N/T though did have some earlier.  Easing Factors: massaging/putting pressure on R UT, alternating tylenol and Ibuprofen, hot shower,  reaching Aggravating Factors:  at rest, tilting head down to look at phone, looking up  PRECAUTIONS: Other: cervical and lumbar anterolisthesis     WEIGHT BEARING RESTRICTIONS: No  FALLS:  Has patient fallen in last 6 months? 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   PLOF: Independent  PATIENT GOALS: decrease pain, return to PLOF, avoid surgery, improve posture   OBJECTIVE:  Note: Objective measures were completed at Evaluation unless otherwise noted.  DIAGNOSTIC FINDINGS:  Cervical X rays: BONES: Diffuse cervical kyphosis. Fixed 3 mm anterolisthesis C2-C3, likely degenerative in nature. Incomplete segmentation C5-6 with  rudimentary disc space.   DISCS AND DEGENERATIVE CHANGES: Disc space narrowing and endplate remodeling of C4-5 and C6-7 in keeping with changes of advanced degenerative disc disease. Moderate degenerative changes seen at C3-4. Asymmetric left facet arthrosis noted at C3-C4.   SOFT TISSUES: No prevertebral soft tissue swelling.   IMPRESSION: 1. Fixed 3 mm anterolisthesis C2-3, likely degenerative in nature. No abnormal motion with flexion and extension to suggest ligamentous instability. 2. Incomplete segmentation C5-6 with rudimentary disc space. 3. Disc space narrowing and endplate remodeling of C4-5 and C6-7, consistent with advanced degenerative disc disease. 4. Asymmetric left facet arthrosis at C3-C4, not well visualized. 5. Mild congenital narrowing of the spinal canal.  Shoulder x ray: FINDINGS: There is no evidence of fracture or dislocation. Mild acromioclavicular spurring. No erosive change or evidence of focal bone abnormality. Soft tissues are unremarkable.   IMPRESSION: Mild acromioclavicular spurring.  PATIENT SURVEYS:  NDI:  NECK DISABILITY INDEX   Date: 05/21/24 Score 06/28/24  Pain intensity 2 = The pain is moderate at the moment 1  2. Personal care (washing, dressing, etc.) 1 =  I can look after myself normally but it causes extra pain 1  3. Lifting 4 =  I can only lift very light weights 5  4. Reading 0 = I can read as much as I want to with no pain in my neck 1  5. Headaches 4 = I have severe headaches, which come frequently  0  6. Concentration 2 = I have a fair degree of difficulty in concentrating when I want to 1  7. Work 3 =  I cannot do my usual work 1  8. Driving 2 =  I can drive my car as long as I want with moderate pain in my neck 1  9. Sleeping 1 = My sleep is slightly disturbed (less than 1 hr sleepless) 2  10. Recreation 1 =  I am able to engage in all my recreation activities, with some pain in my neck 3  Total 20/50 16/50   Minimum  Detectable Change (90% confidence): 5 points or 10% points  COGNITION: Overall cognitive status: Within functional limits for tasks assessed   POSTURE: forward head  PALPATION: Pt felts spasms in R UE with palpation of trigger point in R UT.  Pt had tenderness with palpation of R UT > L UT.  He has soft tissue tightness in R > L UT.  Pt had tenderness in R sided cervical paraspinals.    CERVICAL ROM:   Active ROM A/PROM (deg) eval AROM 11/20  Flexion 28 with pain 30 deg with N/T  Extension    Right lateral flexion 30 with a little pain 33 with a little pain  Left lateral flexion 27 26  Right rotation 54 with a little pain 51  Left rotation 53 60   (Blank rows = not tested)  UPPER EXTREMITY ROM:  Active ROM Right eval Left eval  Shoulder flexion [facility] WFL and greater ROM on L  Shoulder extension    Shoulder abduction [facility] WFL and greater ROM on L  Shoulder adduction    Shoulder extension    Shoulder internal rotation    Shoulder external rotation    Elbow flexion    Elbow extension    Wrist flexion    Wrist extension    Wrist ulnar deviation    Wrist radial deviation    Wrist pronation    Wrist supination     (Blank rows = not tested)  UPPER EXTREMITY MMT:  MMT Right eval Left eval  Shoulder flexion 5/5 5/5  Shoulder extension    Shoulder abduction 5/5 5/5  Shoulder adduction    Shoulder extension    Shoulder internal rotation    Shoulder external rotation    Middle trapezius    Lower trapezius    Elbow flexion 5/5 5/5  Elbow extension 5/5 4+/5  Wrist flexion intact   Wrist extension intact   Wrist ulnar deviation    Wrist radial deviation    Wrist pronation    Wrist supination    Grip strength     (Blank rows = not tested)    TREATMENT:                                                                                                                               12/3 Reviewed sx's, pain level, and response to prior treatment.  UBE lvl 1 x 4 mins  (2 mins each fwd/bwd) Standing rows with GTB 2x15 Standing hz abd with YTB 2x10 Bilat ER with YTB 2x12 Cervical rotation AROM 2x10 Supine rhythmic stab's 3x30 sec while holding 1# at 90 deg bilat, 2x30 sec at 60 deg without holding   STM and TPR to bilat UT and medial scap mm seated   11/26 UBE lvl 1 x 4 mins  STM and TPR to bilat UT and medial scap mm seated  Cervical rotation AROM 2x10 Supine rhythmic stab's x45 sec at 90 deg bilat, 2x30 sec at 60 deg Standing hz abd with YTB 2x10 Bilat  ER with YTB 2x10-12 Standing rows with RTB  11/25 Manual:  Seated: trigger point release to upper traps using IASTYM bilateral  Trigger point release to cervical spine   Neuro re-ed:  Bilateral ER 3x10 Horizontal abduction yellow 3x10   Row 3x10 red  Shoulder extension 3x10 red  Cervical rotation 2x10  All with cuing for range and posture   11/20 UBE x 3 mins PT assessed cervical AROM.  See above. Cervical AROM 2x10 bilat Standing rows with retraction with GTB 2x10 Standing shoulder extension with retraction with RTB 2x10 Bilat ER with retraction with YTB 2x10 Standing horizontal abd with YTB 2x10   Pt received STM and TPR to bilat UT and R medial scap mm seated   11/18 Manual Therapy: Reviewed pt presentation, respones to prior treatment, HEP compliance, and pain level.  Pt received STM and TPR to bilat UT and R medial scap mm seated and to bilat suboccipitals and cervical paraspinals and R UT in supine with LE's elevated on wedge.  Cervical AROM 2x10 bilat Standing rows with retraction with GTB 2x10 Standing shoulder extension with retraction 2x10 Bilat ER with retraction with YTB 2x10 Supine rhythmic stab's at 90 deg flexion 3x45 sec bilat   11/13 Manual Therapy: Reviewed pt presentation, respones to prior treatment, HEP compliance, and pain level.  Pt received STM and TPR to R UT and medial scap mm seated and to bilat suboccipitals and cervical paraspinals in supine  with LE's elevated on wedge.  Supine rhythmic stab's at 90 deg flexion 3x30 sec bilat Supine shoulder ABC 2# x 1 rep bilat   11/11 Manual Therapy: Reviewed pt presentation, respones to prior treatment, HEP compliance, and pain level.  Pt received STM and TPR to R UT and medial scap mm seated and to bilat suboccipitals and cervical paraspinals in supine with LE's elevated on wedge.  Neuro Re-ed: Supine rhythmic stab's at 90 deg flexion 2x30 sec bilat Supine shoulder ABC 2# x 1 rep bilat Standing rows with RTB x12, GTB x10 Standing bilat shoulder ER with YTB 2x10  Standing shoulder horizontal abduction with YTB x10   PATIENT EDUCATION:  Education details: dx, HEP, rationale of interventions, relevant anatomy, appropriate response, sx response, importance of posture, POC, and prognosis.  PT answered pt's questions.  Person educated: Patient Education method: Explanation, Demonstration, Tactile cues, Verbal cues, and Handouts Education comprehension: verbalized understanding, returned demonstration, verbal cues required, tactile cues required, and needs further education  HOME EXERCISE PROGRAM: Access Code: [email or web address] URL: [email or web address] Date: 05/21/2024 Prepared by: Mose Minerva  Exercises - Standing Backward Shoulder Rolls  - 2-3 x daily - 7 x weekly - 2 sets - 10 reps - Standing Scapular Retraction  - 2 x daily - 7 x weekly - 2 sets - 10 reps - 3 second  hold  ASSESSMENT:  CLINICAL IMPRESSION: Pt is improving with sx's.  He reports having N/T in sitting and lying some though feels it more when he gets up from a seated position.  The N/T quickly resolves as he moves around.  PT did not perform MT in the supine position due to reports of N/T in that position though did perform rhythmic stab's in supine.  PT did not perform standing shoulder extension today due his reports of that exercise irritating his back.  Pt performed exercises well without c/o's.  He does  have some soft tissue tightness in UT and medial scap mm though has decreased soft tissue tightness in UT.  Pt responded well  to treatment having no increased pain or sx's after treatment.    OBJECTIVE IMPAIRMENTS: decreased ROM, decreased strength, hypomobility, impaired flexibility, and pain.   ACTIVITY LIMITATIONS: sleeping  PARTICIPATION LIMITATIONS:   PERSONAL FACTORS: Time since onset of injury/illness/exacerbation and 1 comorbidity: chronic lumbar pain are also affecting patient's functional outcome.   REHAB POTENTIAL: Good  CLINICAL DECISION MAKING: Stable/uncomplicated  EVALUATION COMPLEXITY: Low   GOALS:  SHORT TERM GOALS: Target date:  06/11/2024   Pt will be independent and compliant with HEP for improved pain, sx's, ROM, postural strength, and function. Baseline:  Goal status:  GOAL MET  11/20  2.  Pt will report at least a 25% improvement in pain and sx's overall.  Baseline:  Goal status: GOAL MET   11/20  3.  Pt will demo improved cervical rotation AROM to be at least 60 deg bilat and Sb'ing to at least 35 deg bilat for improved stiffness and mobility.   Baseline:  Goal status: 25% MET    LONG TERM GOALS: Target date: 08/02/2024  Pt will report at least a 70% improvement in pain and sx's overall.  Baseline:  Goal status:  PARTIALLY MET  11/20  2.  Pt's NDI will improve by at 5 points for a clinically significant improvement in self perceived disability and function.   Baseline:  Goal status: INITIAL  3.  Pt will sleep at least 5/7 nights per week without disturbed sleep from pain and/or radicular sx's.  Baseline:  Goal status: ONGOING  4.  Pt will demo improved soft tissue tightness in cervical paraspinals and UT including reduced trigger points in UT for reduced muscle tension and decreased pain. Baseline:  Goal status: INITIAL     PLAN:  PT FREQUENCY: 1-2x/week  PT DURATION: 4-5 weeks  PLANNED INTERVENTIONS: [id number]- PT Re-evaluation,  97750- Physical Performance Testing, 97110-Therapeutic exercises, 97530- Therapeutic activity, [id number]- Neuromuscular re-education, 97535- Self Care, [id number]- Manual therapy, [id number]- Aquatic Therapy, [id number]- Electrical stimulation (unattended), [id number]- Electrical stimulation (manual), [id number]- Ultrasound, [id number] (1-2 muscles), [id number] (3+ muscles)- Dry Needling, Patient/Family education, Taping, Joint mobilization, Cryotherapy, and Moist heat  PLAN FOR NEXT SESSION:  Cont with manual therapy including STM/TPR and postural stabilization and strengthening.  Cervical rotation AROM w/n pt tolerance.  Pt returns to MD on 12/10.     Leigh Minerva III PT, DPT 07/13/24 6:49 AM

## 2024-07-17 ENCOUNTER — Ambulatory Visit (HOSPITAL_BASED_OUTPATIENT_CLINIC_OR_DEPARTMENT_OTHER): Payer: Self-pay | Admitting: Physical Therapy

## 2024-07-17 ENCOUNTER — Other Ambulatory Visit (HOSPITAL_BASED_OUTPATIENT_CLINIC_OR_DEPARTMENT_OTHER): Payer: Self-pay

## 2024-07-17 DIAGNOSIS — M5412 Radiculopathy, cervical region: Secondary | ICD-10-CM | POA: Diagnosis not present

## 2024-07-17 NOTE — Therapy (Incomplete)
 OUTPATIENT PHYSICAL THERAPY CERVICAL TREATMENT   Patient Name: Dustin Rice MRN: [id number] DOB:1956-08-04, 68 y.o., male Today's Date: 07/17/2024  END OF SESSION:          Past Medical History:  Diagnosis Date   Allergic rhinitis    Cervical spine disease    Esophageal reflux    GERD (gastroesophageal reflux disease)    Gilbert's syndrome    Hypercholesterolemia    Hyperlipidemia    Inguinal hernia    RIGHT   Prostatitis    RBBB    Shingles 03/2014   Past Surgical History:  Procedure Laterality Date   HERNIA REPAIR  2004   Patient Active Problem List   Diagnosis Date Noted   Benign localized prostatic hyperplasia with lower urinary tract symptoms (LUTS) 04/05/2024   Chronic pain 04/05/2024   Epigastric pain 04/05/2024   Gastroesophageal reflux disease 04/05/2024   Hematochezia 04/05/2024   Hypercholesterolemia 04/05/2024   Internal hemorrhoids 04/05/2024   Intestinal malabsorption 04/05/2024   Lumbago with sciatica, left side 04/05/2024   Screen for colon cancer 04/05/2024   RBBB 07/14/2023   Dyslipidemia 07/14/2023   Elevated coronary artery calcium  score 07/14/2023   Spinal stenosis of lumbar region 05/13/2023   Spondylolisthesis at L4-L5 level 05/13/2023   Palpitations 05/12/2016     REFERRING PROVIDER: Emiliano Leonce CROME, PA-C  REFERRING DIAG: M54.12 (ICD-10-CM) - Radiculopathy, cervical region  THERAPY DIAG:  No diagnosis found.  Other signs and symptoms involving the musculoskeletal system  Rationale for Evaluation and Treatment: Rehabilitation  ONSET DATE: PT order 05/14/24  SUBJECTIVE:                                                                                                                                                                                                         SUBJECTIVE STATEMENT: Pt reports having N/T in sitting and lying some though feels it more when he gets up from a seated position.  He states it quickly  goes away as he moves around. Pt denies any adverse effects after prior treatment.       Hand dominance:  R hand dominant, though writes and eats with L hand  PERTINENT HISTORY:  Cervical anterolisthesis  Hx of chronic lumbar pain--spinal stenosis and Grade 1 anterolisthesis L3-L4 and L4-5.  He receives injections.   PAIN:  NPRS:  1/10 current, 9-10/10 worst, 1/10 best Location:  R sided cervical/UT and superior proximal scap  Pt denies any N/T though did have some earlier.  Easing Factors: massaging/putting pressure on R UT, alternating tylenol and Ibuprofen, hot shower,  reaching Aggravating Factors:  at rest, tilting head down to look at phone, looking up  PRECAUTIONS: Other: cervical and lumbar anterolisthesis     WEIGHT BEARING RESTRICTIONS: No  FALLS:  Has patient fallen in last 6 months? 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   PLOF: Independent  PATIENT GOALS: decrease pain, return to PLOF, avoid surgery, improve posture   OBJECTIVE:  Note: Objective measures were completed at Evaluation unless otherwise noted.  DIAGNOSTIC FINDINGS:  Cervical X rays: BONES: Diffuse cervical kyphosis. Fixed 3 mm anterolisthesis C2-C3, likely degenerative in nature. Incomplete segmentation C5-6 with rudimentary disc space.   DISCS AND DEGENERATIVE CHANGES: Disc space narrowing and endplate remodeling of C4-5 and C6-7 in keeping with changes of advanced degenerative disc disease. Moderate degenerative changes seen at C3-4. Asymmetric left facet arthrosis noted at C3-C4.   SOFT TISSUES: No prevertebral soft tissue swelling.   IMPRESSION: 1. Fixed 3 mm anterolisthesis C2-3, likely degenerative in nature. No abnormal motion with flexion and extension to  suggest ligamentous instability. 2. Incomplete segmentation C5-6 with rudimentary disc space. 3. Disc space narrowing and endplate remodeling of C4-5 and C6-7, consistent with advanced degenerative disc disease. 4. Asymmetric left facet arthrosis at C3-C4, not well visualized. 5. Mild congenital narrowing of the spinal canal.  Shoulder x ray: FINDINGS: There is no evidence of fracture or dislocation. Mild acromioclavicular spurring. No erosive change or evidence of focal bone abnormality. Soft tissues are unremarkable.   IMPRESSION: Mild acromioclavicular spurring.  PATIENT SURVEYS:  NDI:  NECK DISABILITY INDEX   Date: 05/21/24 Score 06/28/24  Pain intensity 2 = The pain is moderate at the moment 1  2. Personal care (washing, dressing, etc.) 1 =  I can look after myself normally but it causes extra pain 1  3. Lifting 4 =  I can only lift very light weights 5  4. Reading 0 = I can read as much as I want to with no pain in my neck 1  5. Headaches 4 = I have severe headaches, which come frequently  0  6. Concentration 2 = I have a fair degree of difficulty in concentrating when I want to 1  7. Work 3 =  I cannot do my usual work 1  8. Driving 2 =  I can drive my car as long as I want with moderate pain in my neck 1  9. Sleeping 1 = My sleep is slightly disturbed (less than 1 hr sleepless) 2  10. Recreation 1 =  I am able to engage in all my recreation activities, with some pain in my neck 3  Total 20/50 16/50   Minimum Detectable Change (90% confidence): 5 points or 10% points  COGNITION: Overall cognitive status: Within functional limits for tasks assessed   POSTURE: forward head  PALPATION: Pt felts spasms in R UE with palpation of trigger point in R UT.  Pt had tenderness with palpation of R UT > L UT.  He has soft tissue tightness in R > L UT.  Pt had tenderness in R sided cervical paraspinals.    CERVICAL ROM:   Active ROM A/PROM (deg) eval AROM 11/20  Flexion 28  with pain 30 deg with N/T  Extension    Right lateral flexion 30 with a little  pain 33 with a little pain  Left lateral flexion 27 26  Right rotation 54 with a little pain 51  Left rotation 53 60   (Blank rows = not tested)  UPPER EXTREMITY ROM:  Active ROM Right eval Left eval  Shoulder flexion [facility] WFL and greater ROM on L  Shoulder extension    Shoulder abduction [facility] WFL and greater ROM on L  Shoulder adduction    Shoulder extension    Shoulder internal rotation    Shoulder external rotation    Elbow flexion    Elbow extension    Wrist flexion    Wrist extension    Wrist ulnar deviation    Wrist radial deviation    Wrist pronation    Wrist supination     (Blank rows = not tested)  UPPER EXTREMITY MMT:  MMT Right eval Left eval  Shoulder flexion 5/5 5/5  Shoulder extension    Shoulder abduction 5/5 5/5  Shoulder adduction    Shoulder extension    Shoulder internal rotation    Shoulder external rotation    Middle trapezius    Lower trapezius    Elbow flexion 5/5 5/5  Elbow extension 5/5 4+/5  Wrist flexion intact   Wrist extension intact   Wrist ulnar deviation    Wrist radial deviation    Wrist pronation    Wrist supination    Grip strength     (Blank rows = not tested)    TREATMENT:                                                                                                                               12/3 Reviewed sx's, pain level, and response to prior treatment.  UBE lvl 1 x 4 mins (2 mins each fwd/bwd) Standing rows with GTB 2x15 Standing hz abd with YTB 2x10 Bilat ER with YTB 2x12 Cervical rotation AROM 2x10 Supine rhythmic stab's 3x30 sec while holding 1# at 90 deg bilat, 2x30 sec at 60 deg without holding   STM and TPR to bilat UT and medial scap mm seated   11/26 UBE lvl 1 x 4 mins  STM and TPR to bilat UT and medial scap mm seated  Cervical rotation AROM 2x10 Supine rhythmic stab's x45 sec at 90 deg bilat, 2x30 sec at 60  deg Standing hz abd with YTB 2x10 Bilat ER with YTB 2x10-12 Standing rows with RTB  11/25 Manual:  Seated: trigger point release to upper traps using IASTYM bilateral  Trigger point release to cervical spine   Neuro re-ed:  Bilateral ER 3x10 Horizontal abduction yellow 3x10   Row 3x10 red  Shoulder extension 3x10 red  Cervical rotation 2x10  All with cuing for range and posture   11/20 UBE x 3 mins PT assessed cervical AROM.  See above. Cervical AROM 2x10 bilat Standing rows with retraction with GTB 2x10 Standing shoulder extension with retraction with RTB 2x10 Bilat ER  with retraction with YTB 2x10 Standing horizontal abd with YTB 2x10   Pt received STM and TPR to bilat UT and R medial scap mm seated   11/18 Manual Therapy: Reviewed pt presentation, respones to prior treatment, HEP compliance, and pain level.  Pt received STM and TPR to bilat UT and R medial scap mm seated and to bilat suboccipitals and cervical paraspinals and R UT in supine with LE's elevated on wedge.  Cervical AROM 2x10 bilat Standing rows with retraction with GTB 2x10 Standing shoulder extension with retraction 2x10 Bilat ER with retraction with YTB 2x10 Supine rhythmic stab's at 90 deg flexion 3x45 sec bilat   11/13 Manual Therapy: Reviewed pt presentation, respones to prior treatment, HEP compliance, and pain level.  Pt received STM and TPR to R UT and medial scap mm seated and to bilat suboccipitals and cervical paraspinals in supine with LE's elevated on wedge.  Supine rhythmic stab's at 90 deg flexion 3x30 sec bilat Supine shoulder ABC 2# x 1 rep bilat   11/11 Manual Therapy: Reviewed pt presentation, respones to prior treatment, HEP compliance, and pain level.  Pt received STM and TPR to R UT and medial scap mm seated and to bilat suboccipitals and cervical paraspinals in supine with LE's elevated on wedge.  Neuro Re-ed: Supine rhythmic stab's at 90 deg flexion 2x30 sec  bilat Supine shoulder ABC 2# x 1 rep bilat Standing rows with RTB x12, GTB x10 Standing bilat shoulder ER with YTB 2x10  Standing shoulder horizontal abduction with YTB x10   PATIENT EDUCATION:  Education details: dx, HEP, rationale of interventions, relevant anatomy, appropriate response, sx response, importance of posture, POC, and prognosis.  PT answered pt's questions.  Person educated: Patient Education method: Explanation, Demonstration, Tactile cues, Verbal cues, and Handouts Education comprehension: verbalized understanding, returned demonstration, verbal cues required, tactile cues required, and needs further education  HOME EXERCISE PROGRAM: Access Code: [email or web address] URL: [email or web address] Date: 05/21/2024 Prepared by: Mose Minerva  Exercises - Standing Backward Shoulder Rolls  - 2-3 x daily - 7 x weekly - 2 sets - 10 reps - Standing Scapular Retraction  - 2 x daily - 7 x weekly - 2 sets - 10 reps - 3 second  hold  ASSESSMENT:  CLINICAL IMPRESSION: Pt is improving with sx's.  He reports having N/T in sitting and lying some though feels it more when he gets up from a seated position.  The N/T quickly resolves as he moves around.  PT did not perform MT in the supine position due to reports of N/T in that position though did perform rhythmic stab's in supine.  PT did not perform standing shoulder extension today due his reports of that exercise irritating his back.  Pt performed exercises well without c/o's.  He does have some soft tissue tightness in UT and medial scap mm though has decreased soft tissue tightness in UT.  Pt responded well to treatment having no increased pain or sx's after treatment.    OBJECTIVE IMPAIRMENTS: decreased ROM, decreased strength, hypomobility, impaired flexibility, and pain.   ACTIVITY LIMITATIONS: sleeping  PARTICIPATION LIMITATIONS:   PERSONAL FACTORS: Time since onset of injury/illness/exacerbation and 1 comorbidity: chronic  lumbar pain are also affecting patient's functional outcome.   REHAB POTENTIAL: Good  CLINICAL DECISION MAKING: Stable/uncomplicated  EVALUATION COMPLEXITY: Low   GOALS:  SHORT TERM GOALS: Target date:  06/11/2024   Pt will be independent and compliant with HEP for improved pain, sx's, ROM, postural strength, and function. Baseline:  Goal status:  GOAL MET  11/20  2.  Pt will report at least a 25% improvement in pain and sx's overall.  Baseline:  Goal status: GOAL MET   11/20  3.  Pt will demo improved cervical rotation AROM to be at least 60 deg bilat and Sb'ing to at least 35 deg bilat for improved stiffness and mobility.   Baseline:  Goal status: 25% MET    LONG TERM GOALS: Target date: 08/02/2024  Pt will report at least a 70% improvement in pain and sx's overall.  Baseline:  Goal status:  PARTIALLY MET  11/20  2.  Pt's NDI will improve by at 5 points for a clinically significant improvement in self perceived disability and function.   Baseline:  Goal status: INITIAL  3.  Pt will sleep at least 5/7 nights per week without disturbed sleep from pain and/or radicular sx's.  Baseline:  Goal status: ONGOING  4.  Pt will demo improved soft tissue tightness in cervical paraspinals and UT including reduced trigger points in UT for reduced muscle tension and decreased pain. Baseline:  Goal status: INITIAL     PLAN:  PT FREQUENCY: 1-2x/week  PT DURATION: 4-5 weeks  PLANNED INTERVENTIONS: [id number]- PT Re-evaluation, 97750- Physical Performance Testing, 97110-Therapeutic exercises, 97530- Therapeutic activity, [id number]- Neuromuscular re-education, 97535- Self Care, [id number]- Manual therapy, [id number]- Aquatic Therapy, [id number]- Electrical stimulation (unattended), [id number]- Electrical stimulation (manual), [id number]- Ultrasound, [id number] (1-2 muscles), [id number] (3+ muscles)- Dry Needling, Patient/Family education, Taping, Joint mobilization, Cryotherapy, and Moist heat  PLAN FOR NEXT SESSION:   Cont with manual therapy including STM/TPR and postural stabilization and strengthening.  Cervical rotation AROM w/n pt tolerance.  Pt returns to MD on 12/10.     Leigh Minerva III PT, DPT 07/17/24 11:49 AM

## 2024-07-17 NOTE — Therapy (Unsigned)
 OUTPATIENT PHYSICAL THERAPY CERVICAL TREATMENT   Patient Name: Dustin Rice MRN: [id number] DOB:08/08/1956, 68 y.o., male Today's Date: 07/17/2024  END OF SESSION:          Past Medical History:  Diagnosis Date   Allergic rhinitis    Cervical spine disease    Esophageal reflux    GERD (gastroesophageal reflux disease)    Gilbert's syndrome    Hypercholesterolemia    Hyperlipidemia    Inguinal hernia    RIGHT   Prostatitis    RBBB    Shingles 03/2014   Past Surgical History:  Procedure Laterality Date   HERNIA REPAIR  2004   Patient Active Problem List   Diagnosis Date Noted   Benign localized prostatic hyperplasia with lower urinary tract symptoms (LUTS) 04/05/2024   Chronic pain 04/05/2024   Epigastric pain 04/05/2024   Gastroesophageal reflux disease 04/05/2024   Hematochezia 04/05/2024   Hypercholesterolemia 04/05/2024   Internal hemorrhoids 04/05/2024   Intestinal malabsorption 04/05/2024   Lumbago with sciatica, left side 04/05/2024   Screen for colon cancer 04/05/2024   RBBB 07/14/2023   Dyslipidemia 07/14/2023   Elevated coronary artery calcium  score 07/14/2023   Spinal stenosis of lumbar region 05/13/2023   Spondylolisthesis at L4-L5 level 05/13/2023   Palpitations 05/12/2016     REFERRING PROVIDER: Emiliano Leonce CROME, PA-C  REFERRING DIAG: M54.12 (ICD-10-CM) - Radiculopathy, cervical region  THERAPY DIAG:  No diagnosis found.  Other signs and symptoms involving the musculoskeletal system  Rationale for Evaluation and Treatment: Rehabilitation  ONSET DATE: PT order 05/14/24  SUBJECTIVE:                                                                                                                                                                                                         SUBJECTIVE STATEMENT: Pt reports having N/T in sitting and lying some though feels it more when he gets up from a seated position.  He states it quickly  goes away as he moves around. Pt denies any adverse effects after prior treatment.       Hand dominance:  R hand dominant, though writes and eats with L hand  PERTINENT HISTORY:  Cervical anterolisthesis  Hx of chronic lumbar pain--spinal stenosis and Grade 1 anterolisthesis L3-L4 and L4-5.  He receives injections.   PAIN:  NPRS:  1/10 current, 9-10/10 worst, 1/10 best Location:  R sided cervical/UT and superior proximal scap  Pt denies any N/T though did have some earlier.  Easing Factors: massaging/putting pressure on R UT, alternating tylenol and Ibuprofen, hot shower,  reaching Aggravating Factors:  at rest, tilting head down to look at phone, looking up  PRECAUTIONS: Other: cervical and lumbar anterolisthesis     WEIGHT BEARING RESTRICTIONS: No  FALLS:  Has patient fallen in last 6 months? 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   PLOF: Independent  PATIENT GOALS: decrease pain, return to PLOF, avoid surgery, improve posture   OBJECTIVE:  Note: Objective measures were completed at Evaluation unless otherwise noted.  DIAGNOSTIC FINDINGS:  Cervical X rays: BONES: Diffuse cervical kyphosis. Fixed 3 mm anterolisthesis C2-C3, likely degenerative in nature. Incomplete segmentation C5-6 with rudimentary disc space.   DISCS AND DEGENERATIVE CHANGES: Disc space narrowing and endplate remodeling of C4-5 and C6-7 in keeping with changes of advanced degenerative disc disease. Moderate degenerative changes seen at C3-4. Asymmetric left facet arthrosis noted at C3-C4.   SOFT TISSUES: No prevertebral soft tissue swelling.   IMPRESSION: 1. Fixed 3 mm anterolisthesis C2-3, likely degenerative in nature. No abnormal motion with flexion and extension to  suggest ligamentous instability. 2. Incomplete segmentation C5-6 with rudimentary disc space. 3. Disc space narrowing and endplate remodeling of C4-5 and C6-7, consistent with advanced degenerative disc disease. 4. Asymmetric left facet arthrosis at C3-C4, not well visualized. 5. Mild congenital narrowing of the spinal canal.  Shoulder x ray: FINDINGS: There is no evidence of fracture or dislocation. Mild acromioclavicular spurring. No erosive change or evidence of focal bone abnormality. Soft tissues are unremarkable.   IMPRESSION: Mild acromioclavicular spurring.  PATIENT SURVEYS:  NDI:  NECK DISABILITY INDEX   Date: 05/21/24 Score 06/28/24  Pain intensity 2 = The pain is moderate at the moment 1  2. Personal care (washing, dressing, etc.) 1 =  I can look after myself normally but it causes extra pain 1  3. Lifting 4 =  I can only lift very light weights 5  4. Reading 0 = I can read as much as I want to with no pain in my neck 1  5. Headaches 4 = I have severe headaches, which come frequently  0  6. Concentration 2 = I have a fair degree of difficulty in concentrating when I want to 1  7. Work 3 =  I cannot do my usual work 1  8. Driving 2 =  I can drive my car as long as I want with moderate pain in my neck 1  9. Sleeping 1 = My sleep is slightly disturbed (less than 1 hr sleepless) 2  10. Recreation 1 =  I am able to engage in all my recreation activities, with some pain in my neck 3  Total 20/50 16/50   Minimum Detectable Change (90% confidence): 5 points or 10% points  COGNITION: Overall cognitive status: Within functional limits for tasks assessed   POSTURE: forward head  PALPATION: Pt felts spasms in R UE with palpation of trigger point in R UT.  Pt had tenderness with palpation of R UT > L UT.  He has soft tissue tightness in R > L UT.  Pt had tenderness in R sided cervical paraspinals.    CERVICAL ROM:   Active ROM A/PROM (deg) eval AROM 11/20  Flexion 28  with pain 30 deg with N/T  Extension    Right lateral flexion 30 with a little  pain 33 with a little pain  Left lateral flexion 27 26  Right rotation 54 with a little pain 51  Left rotation 53 60   (Blank rows = not tested)  UPPER EXTREMITY ROM:  Active ROM Right eval Left eval  Shoulder flexion [facility] WFL and greater ROM on L  Shoulder extension    Shoulder abduction [facility] WFL and greater ROM on L  Shoulder adduction    Shoulder extension    Shoulder internal rotation    Shoulder external rotation    Elbow flexion    Elbow extension    Wrist flexion    Wrist extension    Wrist ulnar deviation    Wrist radial deviation    Wrist pronation    Wrist supination     (Blank rows = not tested)  UPPER EXTREMITY MMT:  MMT Right eval Left eval  Shoulder flexion 5/5 5/5  Shoulder extension    Shoulder abduction 5/5 5/5  Shoulder adduction    Shoulder extension    Shoulder internal rotation    Shoulder external rotation    Middle trapezius    Lower trapezius    Elbow flexion 5/5 5/5  Elbow extension 5/5 4+/5  Wrist flexion intact   Wrist extension intact   Wrist ulnar deviation    Wrist radial deviation    Wrist pronation    Wrist supination    Grip strength     (Blank rows = not tested)    TREATMENT:                                                                                                                               12/8 Manual:  Seated: trigger point release to upper traps using IASTYM bilateral  Trigger point release to cervical spine   Neuro-muscular re-ed: Bilateral ER  3x10 red  Biceps curls 3lbs 3x12   Shoulder flexion  3x12 3 lbs    12/3 Reviewed sx's, pain level, and response to prior treatment.  UBE lvl 1 x 4 mins (2 mins each fwd/bwd) Standing rows with GTB 2x15 Standing hz abd with YTB 2x10 Bilat ER with YTB 2x12 Cervical rotation AROM 2x10 Supine rhythmic stab's 3x30 sec while holding 1# at 90 deg bilat, 2x30 sec at 60 deg without  holding   STM and TPR to bilat UT and medial scap mm seated   11/26 UBE lvl 1 x 4 mins  STM and TPR to bilat UT and medial scap mm seated  Cervical rotation AROM 2x10 Supine rhythmic stab's x45 sec at 90 deg bilat, 2x30 sec at 60 deg Standing hz abd with YTB 2x10 Bilat ER with YTB 2x10-12 Standing rows with RTB  11/25 Manual:  Seated: trigger point release to upper traps using IASTYM bilateral  Trigger point release to cervical spine   Neuro re-ed:  Bilateral ER 3x10 Horizontal abduction yellow 3x10   Row 3x10 red  Shoulder extension 3x10 red  Cervical  rotation 2x10  All with cuing for range and posture   11/20 UBE x 3 mins PT assessed cervical AROM.  See above. Cervical AROM 2x10 bilat Standing rows with retraction with GTB 2x10 Standing shoulder extension with retraction with RTB 2x10 Bilat ER with retraction with YTB 2x10 Standing horizontal abd with YTB 2x10   Pt received STM and TPR to bilat UT and R medial scap mm seated   11/18 Manual Therapy: Reviewed pt presentation, respones to prior treatment, HEP compliance, and pain level.  Pt received STM and TPR to bilat UT and R medial scap mm seated and to bilat suboccipitals and cervical paraspinals and R UT in supine with LE's elevated on wedge.  Cervical AROM 2x10 bilat Standing rows with retraction with GTB 2x10 Standing shoulder extension with retraction 2x10 Bilat ER with retraction with YTB 2x10 Supine rhythmic stab's at 90 deg flexion 3x45 sec bilat   11/13 Manual Therapy: Reviewed pt presentation, respones to prior treatment, HEP compliance, and pain level.  Pt received STM and TPR to R UT and medial scap mm seated and to bilat suboccipitals and cervical paraspinals in supine with LE's elevated on wedge.  Supine rhythmic stab's at 90 deg flexion 3x30 sec bilat Supine shoulder ABC 2# x 1 rep bilat   11/11 Manual Therapy: Reviewed pt presentation, respones to prior treatment, HEP compliance, and  pain level.  Pt received STM and TPR to R UT and medial scap mm seated and to bilat suboccipitals and cervical paraspinals in supine with LE's elevated on wedge.  Neuro Re-ed: Supine rhythmic stab's at 90 deg flexion 2x30 sec bilat Supine shoulder ABC 2# x 1 rep bilat Standing rows with RTB x12, GTB x10 Standing bilat shoulder ER with YTB 2x10  Standing shoulder horizontal abduction with YTB x10   PATIENT EDUCATION:  Education details: dx, HEP, rationale of interventions, relevant anatomy, appropriate response, sx response, importance of posture, POC, and prognosis.  PT answered pt's questions.  Person educated: Patient Education method: Explanation, Demonstration, Tactile cues, Verbal cues, and Handouts Education comprehension: verbalized understanding, returned demonstration, verbal cues required, tactile cues required, and needs further education  HOME EXERCISE PROGRAM: Access Code: [email or web address] URL: [email or web address] Date: 05/21/2024 Prepared by: Mose Minerva  Exercises - Standing Backward Shoulder Rolls  - 2-3 x daily - 7 x weekly - 2 sets - 10 reps - Standing Scapular Retraction  - 2 x daily - 7 x weekly - 2 sets - 10 reps - 3 second  hold  ASSESSMENT:  CLINICAL IMPRESSION: Pt is improving with sx's.  He reports having N/T in sitting and lying some though feels it more when he gets up from a seated position.  The N/T quickly resolves as he moves around.  PT did not perform MT in the supine position due to reports of N/T in that position though did perform rhythmic stab's in supine.  PT did not perform standing shoulder extension today due his reports of that exercise irritating his back.  Pt performed exercises well without c/o's.  He does have some soft tissue tightness in UT and medial scap mm though has decreased soft tissue tightness in UT.  Pt responded well to treatment having no increased pain or sx's after treatment.    OBJECTIVE IMPAIRMENTS: decreased  ROM, decreased strength, hypomobility, impaired flexibility, and pain.   ACTIVITY LIMITATIONS: sleeping  PARTICIPATION LIMITATIONS:   PERSONAL FACTORS: Time since onset of injury/illness/exacerbation and 1 comorbidity: chronic lumbar pain are also affecting patient's functional outcome.  REHAB POTENTIAL: Good  CLINICAL DECISION MAKING: Stable/uncomplicated  EVALUATION COMPLEXITY: Low   GOALS:  SHORT TERM GOALS: Target date:  06/11/2024   Pt will be independent and compliant with HEP for improved pain, sx's, ROM, postural strength, and function. Baseline:  Goal status:  GOAL MET  11/20  2.  Pt will report at least a 25% improvement in pain and sx's overall.  Baseline:  Goal status: GOAL MET   11/20  3.  Pt will demo improved cervical rotation AROM to be at least 60 deg bilat and Sb'ing to at least 35 deg bilat for improved stiffness and mobility.   Baseline:  Goal status: 25% MET    LONG TERM GOALS: Target date: 08/02/2024  Pt will report at least a 70% improvement in pain and sx's overall.  Baseline:  Goal status:  PARTIALLY MET  11/20  2.  Pt's NDI will improve by at 5 points for a clinically significant improvement in self perceived disability and function.   Baseline:  Goal status: INITIAL  3.  Pt will sleep at least 5/7 nights per week without disturbed sleep from pain and/or radicular sx's.  Baseline:  Goal status: ONGOING  4.  Pt will demo improved soft tissue tightness in cervical paraspinals and UT including reduced trigger points in UT for reduced muscle tension and decreased pain. Baseline:  Goal status: INITIAL     PLAN:  PT FREQUENCY: 1-2x/week  PT DURATION: 4-5 weeks  PLANNED INTERVENTIONS: [id number]- PT Re-evaluation, 97750- Physical Performance Testing, 97110-Therapeutic exercises, 97530- Therapeutic activity, [id number]- Neuromuscular re-education, 97535- Self Care, [id number]- Manual therapy, [id number]- Aquatic Therapy, [id number]- Electrical stimulation  (unattended), [id number]- Electrical stimulation (manual), [id number]- Ultrasound, [id number] (1-2 muscles), [id number] (3+ muscles)- Dry Needling, Patient/Family education, Taping, Joint mobilization, Cryotherapy, and Moist heat  PLAN FOR NEXT SESSION:  Cont with manual therapy including STM/TPR and postural stabilization and strengthening.  Cervical rotation AROM w/n pt tolerance.  Pt returns to MD on 12/10.     Leigh Minerva III PT, DPT 07/17/24 12:18 PM

## 2024-07-18 ENCOUNTER — Other Ambulatory Visit (HOSPITAL_BASED_OUTPATIENT_CLINIC_OR_DEPARTMENT_OTHER): Payer: Self-pay

## 2024-07-18 ENCOUNTER — Encounter (HOSPITAL_BASED_OUTPATIENT_CLINIC_OR_DEPARTMENT_OTHER): Payer: Self-pay | Admitting: Physical Therapy

## 2024-07-18 ENCOUNTER — Encounter (HOSPITAL_BASED_OUTPATIENT_CLINIC_OR_DEPARTMENT_OTHER): Admitting: Physical Therapy

## 2024-07-18 MED ORDER — DIAZEPAM 10 MG PO TABS
ORAL_TABLET | ORAL | 0 refills | Status: DC
  Filled 2024-07-18: qty 1, 1d supply, fill #0

## 2024-07-19 ENCOUNTER — Other Ambulatory Visit: Payer: Self-pay | Admitting: Neurological Surgery

## 2024-07-19 ENCOUNTER — Ambulatory Visit (HOSPITAL_BASED_OUTPATIENT_CLINIC_OR_DEPARTMENT_OTHER): Admitting: Physical Therapy

## 2024-07-19 DIAGNOSIS — R29898 Other symptoms and signs involving the musculoskeletal system: Secondary | ICD-10-CM

## 2024-07-19 DIAGNOSIS — M5412 Radiculopathy, cervical region: Secondary | ICD-10-CM

## 2024-07-19 DIAGNOSIS — M62838 Other muscle spasm: Secondary | ICD-10-CM

## 2024-07-19 NOTE — Therapy (Signed)
 OUTPATIENT PHYSICAL THERAPY CERVICAL TREATMENT   Patient Name: Dustin Rice MRN: [id number] DOB:[birth year], 68 y.o., male Today's Date: 07/20/2024  END OF SESSION:  PT End of Session - 07/19/24 0858     Visit Number 15    Number of Visits 19    Date for Recertification  08/02/24    Authorization Type MCR A&B    PT Start Time 0854    PT Stop Time 0939    PT Time Calculation (min) 45 min    Activity Tolerance Patient tolerated treatment well    Behavior During Therapy [facility] for tasks assessed/performed                  Past Medical History:  Diagnosis Date   Allergic rhinitis    Cervical spine disease    Esophageal reflux    GERD (gastroesophageal reflux disease)    Gilbert's syndrome    Hypercholesterolemia    Hyperlipidemia    Inguinal hernia    RIGHT   Prostatitis    RBBB    Shingles 03/2014   Past Surgical History:  Procedure Laterality Date   HERNIA REPAIR  2004   Patient Active Problem List   Diagnosis Date Noted   Benign localized prostatic hyperplasia with lower urinary tract symptoms (LUTS) 04/05/2024   Chronic pain 04/05/2024   Epigastric pain 04/05/2024   Gastroesophageal reflux disease 04/05/2024   Hematochezia 04/05/2024   Hypercholesterolemia 04/05/2024   Internal hemorrhoids 04/05/2024   Intestinal malabsorption 04/05/2024   Lumbago with sciatica, left side 04/05/2024   Screen for colon cancer 04/05/2024   RBBB 07/14/2023   Dyslipidemia 07/14/2023   Elevated coronary artery calcium  score 07/14/2023   Spinal stenosis of lumbar region 05/13/2023   Spondylolisthesis at L4-L5 level 05/13/2023   Palpitations 05/12/2016     REFERRING PROVIDER: Emiliano Leonce CROME, PA-C  REFERRING DIAG: M54.12 (ICD-10-CM) - Radiculopathy, cervical region  THERAPY DIAG:  Radiculopathy, cervical region  Other muscle spasm  Other symptoms and signs involving the musculoskeletal system  Other signs and symptoms involving the musculoskeletal  system  Rationale for Evaluation and Treatment: Rehabilitation  ONSET DATE: PT order 05/14/24  SUBJECTIVE:                                                                                                                                                                                                         SUBJECTIVE STATEMENT: Pt states he responds better/feels better when the soft tissue work is done at the end of treatment compared to the beginning of treatment.  Pt saw Dr.  Elsner yesterday and he wants to do a MRI.  Pt states MD looked at the x rays and informed him that he had a vertebrae that had fused on its own.  Pt reports the tingling is 40% better.  Pt states he feels a catch below his neck.   Hand dominance:  R hand dominant, though writes and eats with L hand  PERTINENT HISTORY:  Cervical anterolisthesis  Hx of chronic lumbar pain--spinal stenosis and Grade 1 anterolisthesis L3-L4 and L4-5.  He receives injections.   PAIN:  NPRS:  3-4/10 current, 9-10/10 worst, 1/10 best Location:  R sided cervical/UT and superior proximal scap  Pt denies any N/T though did have some earlier.  Easing Factors: massaging/putting pressure on R UT, alternating tylenol and Ibuprofen, hot shower,  reaching Aggravating Factors:  at rest, tilting head down to look at phone, looking up  PRECAUTIONS: Other: cervical and lumbar anterolisthesis     WEIGHT BEARING RESTRICTIONS: No  FALLS:  Has patient fallen in last 6 months? 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   PLOF: Independent  PATIENT GOALS: decrease pain, return to PLOF, avoid surgery, improve posture   OBJECTIVE:  Note: Objective measures were completed at Evaluation unless otherwise noted.  DIAGNOSTIC  FINDINGS:  Cervical X rays: BONES: Diffuse cervical kyphosis. Fixed 3 mm anterolisthesis C2-C3, likely degenerative in nature. Incomplete segmentation C5-6 with rudimentary disc space.   DISCS AND DEGENERATIVE CHANGES: Disc space narrowing and endplate remodeling of C4-5 and C6-7 in keeping with changes of advanced degenerative disc disease. Moderate degenerative changes seen at C3-4. Asymmetric left facet arthrosis noted at C3-C4.   SOFT TISSUES: No prevertebral soft tissue swelling.   IMPRESSION: 1. Fixed 3 mm anterolisthesis C2-3, likely degenerative in nature. No abnormal motion with flexion and extension to suggest ligamentous instability. 2. Incomplete segmentation C5-6 with rudimentary disc space. 3. Disc space narrowing and endplate remodeling of C4-5 and C6-7, consistent with advanced degenerative disc disease. 4. Asymmetric left facet arthrosis at C3-C4, not well visualized. 5. Mild congenital narrowing of the spinal canal.  Shoulder x ray: FINDINGS: There is no evidence of fracture or dislocation. Mild acromioclavicular spurring. No erosive change or evidence of focal bone abnormality. Soft tissues are unremarkable.   IMPRESSION: Mild acromioclavicular spurring.  PATIENT SURVEYS:  NDI:  NECK DISABILITY INDEX   Date: 05/21/24 Score 06/28/24  Pain intensity 2 = The pain is moderate at the moment 1  2. Personal care (washing, dressing, etc.) 1 =  I can look after myself normally but it causes extra pain 1  3. Lifting 4 =  I can only lift very light weights 5  4. Reading 0 = I can read as much as I want to with no pain in my neck 1  5. Headaches 4 = I have severe headaches, which come frequently  0  6. Concentration 2 = I have a fair degree of difficulty in concentrating when I want to 1  7. Work 3 =  I cannot do my usual work 1  8. Driving 2 =  I can drive my car as long as I want with moderate pain in my neck 1  9. Sleeping 1 = My sleep is slightly disturbed  (less than 1 hr sleepless) 2  10. Recreation  1 =  I am able to engage in all my recreation activities, with some pain in my neck 3  Total 20/50 16/50   Minimum Detectable Change (90% confidence): 5 points or 10% points  COGNITION: Overall cognitive status: Within functional limits for tasks assessed   POSTURE: forward head  PALPATION: Pt felts spasms in R UE with palpation of trigger point in R UT.  Pt had tenderness with palpation of R UT > L UT.  He has soft tissue tightness in R > L UT.  Pt had tenderness in R sided cervical paraspinals.    CERVICAL ROM:   Active ROM A/PROM (deg) eval AROM 11/20  Flexion 28 with pain 30 deg with N/T  Extension    Right lateral flexion 30 with a little pain 33 with a little pain  Left lateral flexion 27 26  Right rotation 54 with a little pain 51  Left rotation 53 60   (Blank rows = not tested)  UPPER EXTREMITY ROM:  Active ROM Right eval Left eval  Shoulder flexion [facility] WFL and greater ROM on L  Shoulder extension    Shoulder abduction [facility] WFL and greater ROM on L  Shoulder adduction    Shoulder extension    Shoulder internal rotation    Shoulder external rotation    Elbow flexion    Elbow extension    Wrist flexion    Wrist extension    Wrist ulnar deviation    Wrist radial deviation    Wrist pronation    Wrist supination     (Blank rows = not tested)  UPPER EXTREMITY MMT:  MMT Right eval Left eval  Shoulder flexion 5/5 5/5  Shoulder extension    Shoulder abduction 5/5 5/5  Shoulder adduction    Shoulder extension    Shoulder internal rotation    Shoulder external rotation    Middle trapezius    Lower trapezius    Elbow flexion 5/5 5/5  Elbow extension 5/5 4+/5  Wrist flexion intact   Wrist extension intact   Wrist ulnar deviation    Wrist radial deviation    Wrist pronation    Wrist supination    Grip strength     (Blank rows = not tested)    TREATMENT:                                                                                                                                12/11 UBE x 4 mins lvl 1 (2 min fwd and bwd) Standing rows with scap retraction with GTB 2x15 Standing hz abd with YTB 2x10 Bilateral ER 3x10 red  Biceps curls 3lbs 3x12  Supine rhythmic stab's at 90 deg flexion while holding 1# 3x30 sec bilat  STM and TPR to bilat UT and medial scap mm seated   12/8 Manual:  Seated: trigger point release to upper traps using IASTYM bilateral  Trigger point release to cervical spine  Neuro-muscular re-ed: Bilateral ER  3x10 red  Biceps curls 3lbs 3x12   Shoulder flexion  3x12 3 lbs    12/3 Reviewed sx's, pain level, and response to prior treatment.  UBE lvl 1 x 4 mins (2 mins each fwd/bwd) Standing rows with GTB 2x15 Standing hz abd with YTB 2x10 Bilat ER with YTB 2x12 Cervical rotation AROM 2x10 Supine rhythmic stab's 3x30 sec while holding 1# at 90 deg bilat, 2x30 sec at 60 deg without holding   STM and TPR to bilat UT and medial scap mm seated   11/26 UBE lvl 1 x 4 mins  STM and TPR to bilat UT and medial scap mm seated  Cervical rotation AROM 2x10 Supine rhythmic stab's x45 sec at 90 deg bilat, 2x30 sec at 60 deg Standing hz abd with YTB 2x10 Bilat ER with YTB 2x10-12 Standing rows with RTB  11/25 Manual:  Seated: trigger point release to upper traps using IASTYM bilateral  Trigger point release to cervical spine   Neuro re-ed:  Bilateral ER 3x10 Horizontal abduction yellow 3x10   Row 3x10 red  Shoulder extension 3x10 red  Cervical rotation 2x10  All with cuing for range and posture   11/20 UBE x 3 mins PT assessed cervical AROM.  See above. Cervical AROM 2x10 bilat Standing rows with retraction with GTB 2x10 Standing shoulder extension with retraction with RTB 2x10 Bilat ER with retraction with YTB 2x10 Standing horizontal abd with YTB 2x10   Pt received STM and TPR to bilat UT and R medial scap mm seated   11/18 Manual  Therapy: Reviewed pt presentation, respones to prior treatment, HEP compliance, and pain level.  Pt received STM and TPR to bilat UT and R medial scap mm seated and to bilat suboccipitals and cervical paraspinals and R UT in supine with LE's elevated on wedge.  Cervical AROM 2x10 bilat Standing rows with retraction with GTB 2x10 Standing shoulder extension with retraction 2x10 Bilat ER with retraction with YTB 2x10 Supine rhythmic stab's at 90 deg flexion 3x45 sec bilat   11/13 Manual Therapy: Reviewed pt presentation, respones to prior treatment, HEP compliance, and pain level.  Pt received STM and TPR to R UT and medial scap mm seated and to bilat suboccipitals and cervical paraspinals in supine with LE's elevated on wedge.  Supine rhythmic stab's at 90 deg flexion 3x30 sec bilat Supine shoulder ABC 2# x 1 rep bilat   11/11 Manual Therapy: Reviewed pt presentation, respones to prior treatment, HEP compliance, and pain level.  Pt received STM and TPR to R UT and medial scap mm seated and to bilat suboccipitals and cervical paraspinals in supine with LE's elevated on wedge.  Neuro Re-ed: Supine rhythmic stab's at 90 deg flexion 2x30 sec bilat Supine shoulder ABC 2# x 1 rep bilat Standing rows with RTB x12, GTB x10 Standing bilat shoulder ER with YTB 2x10  Standing shoulder horizontal abduction with YTB x10   PATIENT EDUCATION:  Education details: dx, HEP, rationale of interventions, relevant anatomy, appropriate response, sx response, importance of posture, POC, and prognosis.  PT answered pt's questions.  Person educated: Patient Education method: Explanation, Demonstration, Tactile cues, Verbal cues, and Handouts Education comprehension: verbalized understanding, returned demonstration, verbal cues required, tactile cues required, and needs further education  HOME EXERCISE PROGRAM: Access Code: [email or web address] URL: [email or web address] Date: 05/21/2024 Prepared  by: Mose Minerva  Exercises - Standing Backward Shoulder Rolls  - 2-3 x daily - 7 x weekly - 2 sets -  10 reps - Standing Scapular Retraction  - 2 x daily - 7 x weekly - 2 sets - 10 reps - 3 second  hold  ASSESSMENT:  CLINICAL IMPRESSION: Pt saw MD and he ordered a MRI.  Pt states he was informed that he had vertebrae which naturally fused per MD after viewing the x rays.  Pt reports the tingling is 40% better.  Pt performed and tolerated the exercises well.  PT performed manual therapy at the end of treatment per pt request.  Pt continues to have soft tissue tightness though is improving overall.  He responded well to treatment stating he felt better after treatment than when he came in today.  Pain improved from 3-4/10 at the beginning of treatment to 1-2/10 at the end of treatment.   OBJECTIVE IMPAIRMENTS: decreased ROM, decreased strength, hypomobility, impaired flexibility, and pain.   ACTIVITY LIMITATIONS: sleeping  PARTICIPATION LIMITATIONS:   PERSONAL FACTORS: Time since onset of injury/illness/exacerbation and 1 comorbidity: chronic lumbar pain are also affecting patient's functional outcome.   REHAB POTENTIAL: Good  CLINICAL DECISION MAKING: Stable/uncomplicated  EVALUATION COMPLEXITY: Low   GOALS:  SHORT TERM GOALS: Target date:  06/11/2024   Pt will be independent and compliant with HEP for improved pain, sx's, ROM, postural strength, and function. Baseline:  Goal status:  GOAL MET  11/20  2.  Pt will report at least a 25% improvement in pain and sx's overall.  Baseline:  Goal status: GOAL MET   11/20  3.  Pt will demo improved cervical rotation AROM to be at least 60 deg bilat and Sb'ing to at least 35 deg bilat for improved stiffness and mobility.   Baseline:  Goal status: 25% MET    LONG TERM GOALS: Target date: 08/02/2024  Pt will report at least a 70% improvement in pain and sx's overall.  Baseline:  Goal status:  PARTIALLY MET  11/20  2.  Pt's NDI  will improve by at 5 points for a clinically significant improvement in self perceived disability and function.   Baseline:  Goal status: INITIAL  3.  Pt will sleep at least 5/7 nights per week without disturbed sleep from pain and/or radicular sx's.  Baseline:  Goal status: ONGOING  4.  Pt will demo improved soft tissue tightness in cervical paraspinals and UT including reduced trigger points in UT for reduced muscle tension and decreased pain. Baseline:  Goal status: INITIAL     PLAN:  PT FREQUENCY: 1-2x/week  PT DURATION: 4-5 weeks  PLANNED INTERVENTIONS: [id number]- PT Re-evaluation, 97750- Physical Performance Testing, 97110-Therapeutic exercises, 97530- Therapeutic activity, [id number]- Neuromuscular re-education, 97535- Self Care, [id number]- Manual therapy, [id number]- Aquatic Therapy, [id number]- Electrical stimulation (unattended), [id number]- Electrical stimulation (manual), [id number]- Ultrasound, 79439 (1-2 muscles), 20561 (3+ muscles)- Dry Needling, Patient/Family education, Taping, Joint mobilization, Cryotherapy, and Moist heat  PLAN FOR NEXT SESSION:  Cont with manual therapy including STM/TPR and postural stabilization and strengthening.  Cervical rotation AROM w/n pt tolerance.     Leigh Minerva III PT, DPT 07/20/2024 1:36 PM

## 2024-07-20 ENCOUNTER — Encounter (HOSPITAL_BASED_OUTPATIENT_CLINIC_OR_DEPARTMENT_OTHER): Payer: Self-pay | Admitting: Physical Therapy

## 2024-07-24 ENCOUNTER — Ambulatory Visit: Admitting: Psychiatry

## 2024-07-24 DIAGNOSIS — F411 Generalized anxiety disorder: Secondary | ICD-10-CM

## 2024-07-24 NOTE — Progress Notes (Signed)
 [facility] Counselor/Therapist Progress Note  Patient ID: Dustin Rice, MRN: [id number],    Date: 07/24/2024  Time Spent: 55 minutes   Treatment Type: Individual Therapy  Reported Symptoms:   anxious, family stressors, frustrations   Mental Status Exam:  Appearance:   Casual     Behavior:  Appropriate, Sharing, and Motivated  Motor:  Normal  Speech/Language:   Clear and Coherent  Affect:  anxious  Mood:  anxious  Thought process:  goal directed  Thought content:    WNL  Sensory/Perceptual disturbances:    WNL  Orientation:  oriented to person, place, time/date, situation, day of week, month of year, year, and stated date of Dec. 16, 2025  Attention:  Good  Concentration:  Good  Memory:  WNL  Fund of knowledge:   Good  Insight:    Good  Judgment:   Good  Impulse Control:  Good   Risk Assessment: Danger to Self:  No Self-injurious Behavior: No Danger to Others: No Duty to Warn:no Physical Aggression / Violence:No  Access to Firearms a concern: No  Gang Involvement:No   Subjective:   Patient today in session reporting anxiety, some worrying, stress, and issues with other family members including supporting Dustin Rice with her physical/emotional challenges. Dustin Rice fell past 1 1/2 weeks and was hospitalized. Out of hospital now. Patient stressed and worrying, anxious re: elderly Rice and Dustin Rice who is having physical issues (back) also which patient shared. Processing some challenges within marriage and both he and Rice are working together on their issues currently. Continues working also on Dustin own personal goals regarding better management of anxiety and frustrations in addition to working on improving Dustin communication with Dustin Rice and being more sensitive to needs within their marriage.  Definitely noticing some progress in their relationship despite some challenges as well.   Interventions: Cognitive Behavioral Therapy, Solution-Oriented/Positive  Psychology, and Ego-Supportive Long term goal: Reduce overall level, frequency, and intensity of anxiety and depression so that daily functioning is not impaired. Short term goal: Increase understanding of beliefs and messages that produce worry and anxiety. Strategies: Identify and use specific coping strategies for anxiety and depression reduction.  Also explore alternative ways of communicating better with spouse and being more sensitive to needs within the family, including both Rice and Rice.    Diagnosis:   ICD-10-CM   1. Generalized anxiety disorder  F41.1      Plan:    Patient working well in session today on Dustin frustration, better managing Dustin stress, and concerns that are related to Dustin family/extended family and himself.  Does feel like he and Rice are making some progress as they both work on the marriage although it may seem slow at times but patient reports they are both motivated and trying to work together in both their communication and in their interactions with each other.  Also processed some issues with Dustin Rice (A) who they have been concerned about regarding certain issues but seem to be feeling more optimistic that he is making good decisions at this point. (Not all details included in this note due to patient privacy needs).  Continues to confront some challenging issues within her family and extended family including working with Dustin siblings to make decisions for the care of their elderly Rice.  Continues to look more for what he can change versus what he cannot change.  Seems to be taking a lead role in helping family make decisions for their  Rice that are in her best interest. Therapist continues to encourage and support patient and working on Dustin self affirming behaviors as noted in sessions: Staying in touch with supportive people, being more committed to ongoing work with Dustin treatment goals, increase self-awareness and commitment to personal health goals, take  breaks as needed, positive self-talk and self-care, healthy nutrition and exercise, stay in the present focused on the things he can control versus cannot, allow Dustin faith to be an emotional support as well as spiritual, continue making Dustin family a high priority including himself, and recognize the strength he shows working with goal-directed behaviors that help to move him in a more positive direction and support Dustin overall emotional health and outlook as he moves forward.  Dustin Rice does recognize progress he has made and needs to continue working with goal-directed behaviors as he moves in a more hopeful and healthier direction into the future.  Goal review and progress/challenges noted with patient.  Next appointment within 3 weeks.   Dustin Bunde, LCSW

## 2024-07-26 ENCOUNTER — Ambulatory Visit (HOSPITAL_BASED_OUTPATIENT_CLINIC_OR_DEPARTMENT_OTHER): Admitting: Physical Therapy

## 2024-07-26 DIAGNOSIS — R29898 Other symptoms and signs involving the musculoskeletal system: Secondary | ICD-10-CM

## 2024-07-26 DIAGNOSIS — M62838 Other muscle spasm: Secondary | ICD-10-CM

## 2024-07-26 DIAGNOSIS — M5412 Radiculopathy, cervical region: Secondary | ICD-10-CM | POA: Diagnosis not present

## 2024-07-26 NOTE — Therapy (Signed)
 OUTPATIENT PHYSICAL THERAPY CERVICAL TREATMENT   Patient Name: Dustin Rice MRN: [id number] DOB:October 30, 1955, 68 y.o., male Today's Date: 07/26/2024  END OF SESSION:            Past Medical History:  Diagnosis Date   Allergic rhinitis    Cervical spine disease    Esophageal reflux    GERD (gastroesophageal reflux disease)    Gilbert's syndrome    Hypercholesterolemia    Hyperlipidemia    Inguinal hernia    RIGHT   Prostatitis    RBBB    Shingles 03/2014   Past Surgical History:  Procedure Laterality Date   HERNIA REPAIR  2004   Patient Active Problem List   Diagnosis Date Noted   Benign localized prostatic hyperplasia with lower urinary tract symptoms (LUTS) 04/05/2024   Chronic pain 04/05/2024   Epigastric pain 04/05/2024   Gastroesophageal reflux disease 04/05/2024   Hematochezia 04/05/2024   Hypercholesterolemia 04/05/2024   Internal hemorrhoids 04/05/2024   Intestinal malabsorption 04/05/2024   Lumbago with sciatica, left side 04/05/2024   Screen for colon cancer 04/05/2024   RBBB 07/14/2023   Dyslipidemia 07/14/2023   Elevated coronary artery calcium  score 07/14/2023   Spinal stenosis of lumbar region 05/13/2023   Spondylolisthesis at L4-L5 level 05/13/2023   Palpitations 05/12/2016     REFERRING PROVIDER: Emiliano Leonce CROME, PA-C  REFERRING DIAG: M54.12 (ICD-10-CM) - Radiculopathy, cervical region  THERAPY DIAG:  No diagnosis found.  Other signs and symptoms involving the musculoskeletal system  Rationale for Evaluation and Treatment: Rehabilitation  ONSET DATE: PT order 05/14/24  SUBJECTIVE:                                                                                                                                                                                                         SUBJECTIVE STATEMENT: Pt has a PT script for lumbar from Dr. Colon.  Pt states his back has been getting worse.  He felt really good after prior  treatment.  Pt continues to have tingling which he notices it more with using his phone and driving.  Pt has numbness when he first gets up in the AM.  Pt states his cervical pain is being manageable.  He is scheduled for a MRI on 12/30.     states he responds better/feels better when the soft tissue work is done at the end of treatment compared to the beginning of treatment.  Pt saw Dr. Colon yesterday and he wants to do a MRI.  Pt states MD looked at the x rays and informed him  that he had a vertebrae that had fused on its own.  Pt reports the tingling is 40% better.  Pt states he feels a catch below his neck.   Hand dominance:  R hand dominant, though writes and eats with L hand  PERTINENT HISTORY:  Cervical anterolisthesis  Hx of chronic lumbar pain--spinal stenosis and Grade 1 anterolisthesis L3-L4 and L4-5.  He receives injections.   PAIN:  NPRS:  1/10 current, 9-10/10 worst, 1/10 best Location:  R sided cervical/UT and superior proximal scap  Pt denies any N/T though did have some earlier.  Easing Factors: massaging/putting pressure on R UT, alternating tylenol and Ibuprofen, hot shower,  reaching Aggravating Factors:  at rest, tilting head down to look at phone, looking up  PRECAUTIONS: Other: cervical and lumbar anterolisthesis     WEIGHT BEARING RESTRICTIONS: No  FALLS:  Has patient fallen in last 6 months? 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   PLOF: Independent  PATIENT GOALS: decrease pain, return to PLOF, avoid surgery, improve posture   OBJECTIVE:  Note: Objective measures were completed at Evaluation unless otherwise noted.  DIAGNOSTIC FINDINGS:  Cervical X rays: BONES: Diffuse cervical kyphosis. Fixed 3 mm anterolisthesis C2-C3,  likely degenerative in nature. Incomplete segmentation C5-6 with rudimentary disc space.   DISCS AND DEGENERATIVE CHANGES: Disc space narrowing and endplate remodeling of C4-5 and C6-7 in keeping with changes of advanced degenerative disc disease. Moderate degenerative changes seen at C3-4. Asymmetric left facet arthrosis noted at C3-C4.   SOFT TISSUES: No prevertebral soft tissue swelling.   IMPRESSION: 1. Fixed 3 mm anterolisthesis C2-3, likely degenerative in nature. No abnormal motion with flexion and extension to suggest ligamentous instability. 2. Incomplete segmentation C5-6 with rudimentary disc space. 3. Disc space narrowing and endplate remodeling of C4-5 and C6-7, consistent with advanced degenerative disc disease. 4. Asymmetric left facet arthrosis at C3-C4, not well visualized. 5. Mild congenital narrowing of the spinal canal.  Shoulder x ray: FINDINGS: There is no evidence of fracture or dislocation. Mild acromioclavicular spurring. No erosive change or evidence of focal bone abnormality. Soft tissues are unremarkable.   IMPRESSION: Mild acromioclavicular spurring.  PATIENT SURVEYS:  NDI:  NECK DISABILITY INDEX   Date: 05/21/24 Score 06/28/24  Pain intensity 2 = The pain is moderate at the moment 1  2. Personal care (washing, dressing, etc.) 1 =  I can look after myself normally but it causes extra pain 1  3. Lifting 4 =  I can only lift very light weights 5  4. Reading 0 = I can read as much as I want to with no pain in my neck 1  5. Headaches 4 = I have severe headaches, which come frequently  0  6. Concentration 2 = I have a fair degree of difficulty in concentrating when I want to 1  7. Work 3 =  I cannot do my usual work 1  8. Driving 2 =  I can drive my car as long as I want with moderate pain in my neck 1  9. Sleeping 1 = My sleep is slightly disturbed (less than 1 hr sleepless) 2  10. Recreation 1 =  I am able to engage in all my recreation activities,  with some pain in my neck 3  Total 20/50 16/50   Minimum Detectable Change (90% confidence): 5 points or 10% points  COGNITION: Overall cognitive status: Within functional limits for tasks assessed   POSTURE: forward head  PALPATION: Pt felts spasms in R UE with palpation of trigger point in R UT.  Pt had tenderness with palpation of R UT > L UT.  He has soft tissue tightness in R > L UT.  Pt had tenderness in R sided cervical paraspinals.    CERVICAL ROM:   Active ROM A/PROM (deg) eval AROM 11/20  Flexion 28 with pain 30 deg with N/T  Extension    Right lateral flexion 30 with a little pain 33 with a little pain  Left lateral flexion 27 26  Right rotation 54 with a little pain 51  Left rotation 53 60   (Blank rows = not tested)  UPPER EXTREMITY ROM:  Active ROM Right eval Left eval  Shoulder flexion [facility] WFL and greater ROM on L  Shoulder extension    Shoulder abduction [facility] WFL and greater ROM on L  Shoulder adduction    Shoulder extension    Shoulder internal rotation    Shoulder external rotation    Elbow flexion    Elbow extension    Wrist flexion    Wrist extension    Wrist ulnar deviation    Wrist radial deviation    Wrist pronation    Wrist supination     (Blank rows = not tested)  UPPER EXTREMITY MMT:  MMT Right eval Left eval  Shoulder flexion 5/5 5/5  Shoulder extension    Shoulder abduction 5/5 5/5  Shoulder adduction    Shoulder extension    Shoulder internal rotation    Shoulder external rotation    Middle trapezius    Lower trapezius    Elbow flexion 5/5 5/5  Elbow extension 5/5 4+/5  Wrist flexion intact   Wrist extension intact   Wrist ulnar deviation    Wrist radial deviation    Wrist pronation    Wrist supination    Grip strength     (Blank rows = not tested)    TREATMENT:                                                                                                                               12/18 PT answered pt's  questions educated pt concerning POC and  UBE x 4 mins lvl 1 (2 min fwd and bwd) Seated  hz abd with YTB 2x10 Seated bilateral ER 3x10 red  Biceps curls 3lbs 3x12  ABC's on wall x 1 rep bilat Supine rhythmic stab's at 90 deg flexion while holding #2  2x30 sec bilat, 0# at 60 deg x 30 sec each  12/11 UBE x 4 mins lvl 1 (2 min fwd and bwd) Standing rows with scap retraction with GTB 2x15 Standing hz abd with YTB 2x10 Bilateral ER 3x10 red  Biceps curls 3lbs  3x12  Supine rhythmic stab's at 90 deg flexion while holding 1# 3x30 sec bilat  STM and TPR to bilat UT and medial scap mm seated   12/8 Manual:  Seated: trigger point release to upper traps using IASTYM bilateral  Trigger point release to cervical spine   Neuro-muscular re-ed: Bilateral ER  3x10 red  Biceps curls 3lbs 3x12   Shoulder flexion  3x12 3 lbs    12/3 Reviewed sx's, pain level, and response to prior treatment.  UBE lvl 1 x 4 mins (2 mins each fwd/bwd) Standing rows with GTB 2x15 Standing hz abd with YTB 2x10 Bilat ER with YTB 2x12 Cervical rotation AROM 2x10 Supine rhythmic stab's 3x30 sec while holding 1# at 90 deg bilat, 2x30 sec at 60 deg without holding   STM and TPR to bilat UT and medial scap mm seated   11/26 UBE lvl 1 x 4 mins  STM and TPR to bilat UT and medial scap mm seated  Cervical rotation AROM 2x10 Supine rhythmic stab's x45 sec at 90 deg bilat, 2x30 sec at 60 deg Standing hz abd with YTB 2x10 Bilat ER with YTB 2x10-12 Standing rows with RTB  11/25 Manual:  Seated: trigger point release to upper traps using IASTYM bilateral  Trigger point release to cervical spine   Neuro re-ed:  Bilateral ER 3x10 Horizontal abduction yellow 3x10   Row 3x10 red  Shoulder extension 3x10 red  Cervical rotation 2x10  All with cuing for range and posture   11/20 UBE x 3 mins PT assessed cervical AROM.  See above. Cervical AROM 2x10 bilat Standing rows with retraction with GTB  2x10 Standing shoulder extension with retraction with RTB 2x10 Bilat ER with retraction with YTB 2x10 Standing horizontal abd with YTB 2x10   Pt received STM and TPR to bilat UT and R medial scap mm seated   11/18 Manual Therapy: Reviewed pt presentation, respones to prior treatment, HEP compliance, and pain level.  Pt received STM and TPR to bilat UT and R medial scap mm seated and to bilat suboccipitals and cervical paraspinals and R UT in supine with LE's elevated on wedge.  Cervical AROM 2x10 bilat Standing rows with retraction with GTB 2x10 Standing shoulder extension with retraction 2x10 Bilat ER with retraction with YTB 2x10 Supine rhythmic stab's at 90 deg flexion 3x45 sec bilat   11/13 Manual Therapy: Reviewed pt presentation, respones to prior treatment, HEP compliance, and pain level.  Pt received STM and TPR to R UT and medial scap mm seated and to bilat suboccipitals and cervical paraspinals in supine with LE's elevated on wedge.  Supine rhythmic stab's at 90 deg flexion 3x30 sec bilat Supine shoulder ABC 2# x 1 rep bilat   11/11 Manual Therapy: Reviewed pt presentation, respones to prior treatment, HEP compliance, and pain level.  Pt received STM and TPR to R UT and medial scap mm seated and to bilat suboccipitals and cervical paraspinals in supine with LE's elevated on wedge.  Neuro Re-ed: Supine rhythmic stab's at 90 deg flexion 2x30 sec bilat Supine shoulder ABC 2# x 1 rep bilat Standing rows with RTB x12, GTB x10 Standing bilat shoulder ER with YTB 2x10  Standing shoulder horizontal abduction with YTB x10   PATIENT EDUCATION:  Education details: dx, HEP, rationale of interventions, relevant anatomy, appropriate response, sx response, importance of posture, POC, and prognosis.  PT answered pt's questions.  Person educated: Patient Education method: Explanation, Demonstration, Tactile cues, Verbal cues, and Handouts Education  comprehension: verbalized  understanding, returned demonstration, verbal cues required, tactile cues required, and needs further education  HOME EXERCISE PROGRAM: Access Code: [email or web address] URL: [email or web address] Date: 05/21/2024 Prepared by: Mose Minerva  Exercises - Standing Backward Shoulder Rolls  - 2-3 x daily - 7 x weekly - 2 sets - 10 reps - Standing Scapular Retraction  - 2 x daily - 7 x weekly - 2 sets - 10 reps - 3 second  hold  ASSESSMENT:  CLINICAL IMPRESSION: Pt saw MD and he ordered a MRI.  Pt states he was informed that he had vertebrae which naturally fused per MD after viewing the x rays.  Pt reports the tingling is 40% better.  Pt performed and tolerated the exercises well.  PT performed manual therapy at the end of treatment per pt request.  Pt continues to have soft tissue tightness though is improving overall.  He responded well to treatment stating he felt better after treatment than when he came in today.  Pain improved from 3-4/10 at the beginning of treatment to 1-2/10 at the end of treatment.   Pt sat up from supine and felt tingling in R UE primarily from elbow to thumb.  Pt stood up and the tingling went away.  Feels better after treatment.  OBJECTIVE IMPAIRMENTS: decreased ROM, decreased strength, hypomobility, impaired flexibility, and pain.   ACTIVITY LIMITATIONS: sleeping  PARTICIPATION LIMITATIONS:   PERSONAL FACTORS: Time since onset of injury/illness/exacerbation and 1 comorbidity: chronic lumbar pain are also affecting patient's functional outcome.   REHAB POTENTIAL: Good  CLINICAL DECISION MAKING: Stable/uncomplicated  EVALUATION COMPLEXITY: Low   GOALS:  SHORT TERM GOALS: Target date:  06/11/2024   Pt will be independent and compliant with HEP for improved pain, sx's, ROM, postural strength, and function. Baseline:  Goal status:  GOAL MET  11/20  2.  Pt will report at least a 25% improvement in pain and sx's overall.  Baseline:  Goal status: GOAL  MET   11/20  3.  Pt will demo improved cervical rotation AROM to be at least 60 deg bilat and Sb'ing to at least 35 deg bilat for improved stiffness and mobility.   Baseline:  Goal status: 25% MET    LONG TERM GOALS: Target date: 08/02/2024  Pt will report at least a 70% improvement in pain and sx's overall.  Baseline:  Goal status:  PARTIALLY MET  11/20  2.  Pt's NDI will improve by at 5 points for a clinically significant improvement in self perceived disability and function.   Baseline:  Goal status: INITIAL  3.  Pt will sleep at least 5/7 nights per week without disturbed sleep from pain and/or radicular sx's.  Baseline:  Goal status: ONGOING  4.  Pt will demo improved soft tissue tightness in cervical paraspinals and UT including reduced trigger points in UT for reduced muscle tension and decreased pain. Baseline:  Goal status: INITIAL     PLAN:  PT FREQUENCY: 1-2x/week  PT DURATION: 4-5 weeks  PLANNED INTERVENTIONS: [id number]- PT Re-evaluation, 97750- Physical Performance Testing, 97110-Therapeutic exercises, 97530- Therapeutic activity, [id number]- Neuromuscular re-education, 97535- Self Care, [id number]- Manual therapy, [id number]- Aquatic Therapy, [id number]- Electrical stimulation (unattended), [id number]- Electrical stimulation (manual), [id number]- Ultrasound, [id number] (1-2 muscles), [id number] (3+ muscles)- Dry Needling, Patient/Family education, Taping, Joint mobilization, Cryotherapy, and Moist heat  PLAN FOR NEXT SESSION:  Evaluate lumbar next visit.  Pt is getting a cervical MRI on 12/30.      Leigh Minerva III PT, DPT 07/26/2024 4:28 PM

## 2024-07-27 ENCOUNTER — Encounter (HOSPITAL_BASED_OUTPATIENT_CLINIC_OR_DEPARTMENT_OTHER): Payer: Self-pay | Admitting: Physical Therapy

## 2024-07-31 ENCOUNTER — Encounter (HOSPITAL_BASED_OUTPATIENT_CLINIC_OR_DEPARTMENT_OTHER): Admitting: Physical Therapy

## 2024-08-07 ENCOUNTER — Ambulatory Visit (HOSPITAL_BASED_OUTPATIENT_CLINIC_OR_DEPARTMENT_OTHER): Admitting: Physical Therapy

## 2024-08-07 DIAGNOSIS — M6281 Muscle weakness (generalized): Secondary | ICD-10-CM

## 2024-08-07 DIAGNOSIS — M5412 Radiculopathy, cervical region: Secondary | ICD-10-CM | POA: Diagnosis not present

## 2024-08-07 DIAGNOSIS — R29898 Other symptoms and signs involving the musculoskeletal system: Secondary | ICD-10-CM

## 2024-08-07 DIAGNOSIS — M5459 Other low back pain: Secondary | ICD-10-CM

## 2024-08-07 NOTE — Therapy (Signed)
 " OUTPATIENT PHYSICAL THERAPY THORACOLUMBAR EVALUATION   Patient Name: Dustin Rice MRN: [id number] DOB:12/26/1955, 68 y.o., male Today's Date: 08/08/2024  END OF SESSION:  PT End of Session - 08/07/24 1414     Visit Number 17   Lumbar Eval   Number of Visits 29    Date for Recertification  09/18/24    Authorization Type MCR A&B    PT Start Time 1318    PT Stop Time 1410    PT Time Calculation (min) 52 min    Activity Tolerance Patient tolerated treatment well    Behavior During Therapy WFL for tasks assessed/performed          Past Medical History:  Diagnosis Date   Allergic rhinitis    Cervical spine disease    Esophageal reflux    GERD (gastroesophageal reflux disease)    Gilbert's syndrome    Hypercholesterolemia    Hyperlipidemia    Inguinal hernia    RIGHT   Prostatitis    RBBB    Shingles 03/2014   Past Surgical History:  Procedure Laterality Date   HERNIA REPAIR  2004   Patient Active Problem List   Diagnosis Date Noted   Benign localized prostatic hyperplasia with lower urinary tract symptoms (LUTS) 04/05/2024   Chronic pain 04/05/2024   Epigastric pain 04/05/2024   Gastroesophageal reflux disease 04/05/2024   Hematochezia 04/05/2024   Hypercholesterolemia 04/05/2024   Internal hemorrhoids 04/05/2024   Intestinal malabsorption 04/05/2024   Lumbago with sciatica, left side 04/05/2024   Screen for colon cancer 04/05/2024   RBBB 07/14/2023   Dyslipidemia 07/14/2023   Elevated coronary artery calcium  score 07/14/2023   Spinal stenosis of lumbar region 05/13/2023   Spondylolisthesis at L4-L5 level 05/13/2023   Palpitations 05/12/2016     REFERRING PROVIDER: Colon Shove, MD   REFERRING DIAG: M43.16 Spondylolisthesis at L4-5  Rationale for Evaluation and Treatment: Rehabilitation  THERAPY DIAG:  Other low back pain  Muscle weakness (generalized)  Other symptoms and signs involving the musculoskeletal system  ONSET DATE: Onset Nov.  2021, Exacerbation Nov. 2025  SUBJECTIVE:                                                                                                                                                                                           SUBJECTIVE STATEMENT: Pt reports his pain began in November 2021 with no specific injury. He does remember picking up some heavy tiles a few months prior though doesn't remember that causing pain.  Pt had PT in 2022 in [place] and didn't receive much relief. He went to see a PT in [place] for 1  visit which did help.  He then received PT for lumbar pain from 12/23 to 06/24.   Pt made great progress and met all goals.  Pt states he was able to go longer before receiving injections.    Pt typically received lumbar injections every 3-5 months.  Pt had a lumbar injection on 11/11 and reports his relief didn't last as long as typical.  Pt fell onto his R side into  the bushes when stepping over some ivy on 07/02/24.  His back pain increased after falling.  Pt states his back was hurting a lot last night.  Yesterday was his worst day.  He wore his back brace earlier today and it helped.  Pt states he is good in sitting and lying down.  He has increased pain with standing.  He has increased pain with standing in the shower.  If he improves his posture, he has improved pain. When he is having N/T he has to stop walking.  He is limited with walking.         PERTINENT HISTORY:  Cervical anterolisthesis  Hx of chronic lumbar pain--spinal stenosis and Grade 1 anterolisthesis L3-L4 and L4-5.  He receives injections.  PAIN:   NPRS:  2/10 current, 10/10 worst, 0/10 best   Location: L > R lumbar flank.  L > R LE, all the way to the foot  Type:  pressure, pushing, N/T Easing Factors:  bending, sitting Aggravating factors:  standing, lumbar extension  PRECAUTIONS: Other: cervical and lumbar anterolisthesis     WEIGHT BEARING RESTRICTIONS: No  FALLS:  Has patient fallen in  last 6 months? Yes. 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   PLOF: Independent  PATIENT GOALS: to improve pain    OBJECTIVE:  Note: Objective measures were completed at Evaluation unless otherwise noted.  DIAGNOSTIC FINDINGS:  X rays in 2021:  Lumbar:  Well-preserved lumbar lordosis and intervertebral disc spaces.  Moderate  to severe lumbar spondylosis    Lumbar MRI in 2022: INDINGS:  #  Grade 1 anterolisthesis L3-4 and L4-5.  #  Vertebral body heights are well maintained.  #  Small meningioma within L2 and L4.  #  Conus terminates at T12-L1 without evidence of tethering.  #  Nerve roots appear normal.  #  Incidental findings: None.     #  L1-2: Normal.  #    #  L2-3: Normal.  #    #  L3-4: Mild degenerative disc disease. Grade 1 anterolisthesis. There is facet arthropathy and disc bulge causing moderate central canal stenosis with mild bilateral neural foraminal narrowing.  #    #  L4-5: Mild degenerative disc disease. Grade 1 anterolisthesis. Facet arthropathy and disc bulge causing moderate to severe central canal stenosis and mild bilateral neural foraminal narrowing.  #    #  L5-S1: Facet arthropathy and disc bulge without significant central canal stenosis or neuroforaminal narrowing.     IMPRESSION:    There is facet arthropathy and disc bulge as well as grade 1 anterolisthesis causing moderate to severe central canal stenosis at the level of L4-5 and moderate canal stenosis at L3-4.   PATIENT SURVEYS:  Modified Oswestry Disability Index:  21 = 42%  COGNITION: Overall cognitive status: Within  functional limits for tasks assessed     SENSATION: 2+ to LT b/t bilat LE dermatomes  MUSCLE LENGTH: Hamstrings:  tightness in bilat   PALPATION: Tenderness to palpation of L sided lower lumbar paraspinals.   Pt has soft tissue tightness in L sided lumbar paraspinals.  R sided mainly WFL with slight tightness in some areas.   LUMBAR ROM:   AROM eval  Flexion WFL  Extension NT  Right lateral flexion 90%  Left lateral flexion WFL  Right rotation 60%  Left rotation 75%   (Blank rows = not tested)  LOWER EXTREMITY MMT:    MMT Right eval Left eval  Hip flexion 5/5 5/5  Hip extension    Hip abduction    Hip adduction    Hip internal rotation    Hip external rotation    Knee flexion 4+/5 seated 5/5 seated  Knee extension 4+/5 5/5  Ankle dorsiflexion 5/5 5/5  Ankle plantarflexion [facility] WFL  Ankle inversion    Ankle eversion     (Blank rows = not tested)  LUMBAR SPECIAL TESTS:  SLR test: negative bilat    TREATMENT:                                                                                                                                PT reviewed HEP. PT answered pt's questions concerning exercises and HEP.  Pt performed supine lumbar rotation.   PATIENT EDUCATION:  Education details: dx, objective findings, relevant anatomy, POC, HEP, and exercise form.  PT answered pt's questions. Person educated: Patient Education method: Explanation, Demonstration, Verbal cues, and Handouts Education comprehension: verbalized understanding and returned demonstration  HOME EXERCISE PROGRAM: Pt has a prior HEP.  ASSESSMENT:  CLINICAL IMPRESSION: Patient is a 68 y.o. male with a dx of lumbar spondylolisthesis who has chronic lumbar pain.  He typically receives lumbar injections every 3-5 months.  Pt had a lumbar injection on 11/11 and reports his relief didn't last as long as typical.  He had an exacerbation of pain when he fell in the bushes onto his R side on 07/02/24.  Pt has pain in L > R lumbar flank and L > R LE all the way to the foot.  He has increased pain with standing including  standing in the shower.  Pain improves with improved posture.  He is limited with walking and has to stop due to N/T in LE.  Pt has limited lumbar AROM, core muscle weakness, and minimal R knee weakness.  He responded very well to PT in 2023 for his back.  Pt should benefit from skilled PT to address impairments and improve overall function.   OBJECTIVE IMPAIRMENTS: decreased activity tolerance, decreased mobility, difficulty walking, decreased ROM, decreased strength, increased fascial restrictions, and pain.   ACTIVITY LIMITATIONS: lifting, standing, bathing, and locomotion level  PARTICIPATION LIMITATIONS: cleaning, shopping, and community activity  PERSONAL FACTORS: Time since onset of injury/illness/exacerbation and 1 comorbidity:  cervical pain are also affecting patient's functional outcome.   REHAB POTENTIAL: Good  CLINICAL DECISION MAKING: Stable/uncomplicated  EVALUATION COMPLEXITY: Low   GOALS:  SHORT TERM GOALS: Target date: 08/28/2024   Pt will be independent and compliant with HEP for improved pain, core strength, and function.   Baseline: Goal status: INITIAL  2.  Pt will report at least a 25% improvement in pain and sx's overall.  Baseline:  Goal status: INITIAL  3.  Pt's worst pain will be no > 7/10. Baseline:  Goal status: INITIAL  4.  Pt will demo improved lumbar rotation AROM to be [facility] bilat for improved stiffness and daily mobility.  Baseline:  Goal status: INITIAL    LONG TERM GOALS: Target date: 09/18/2024   Pt will demo improved core strength as evidenced by performance of core exercises without adverse effects and improved R knee strength to 5/5 MMT for improved performance of and tolerance with functional mobility.  Baseline:  Goal status: INITIAL  2.  Pt will report he is able to ambulate extended community distance without significant lumbar pain and without increased LE pain/sx's. Baseline:  Goal status: INITIAL  3.  Pt will report at least  a 70% improvement in pain and sx's overall including with standing activities..  Baseline:  Goal status: INITIAL  4.  Pt will be able to stand to take a shower without increased pain and sx's.   Baseline:  Goal status: INITIAL     PLAN:  PT FREQUENCY: 2x/week  PT DURATION: 6 weeks  PLANNED INTERVENTIONS: 97164- PT Re-evaluation, 97750- Physical Performance Testing, 97110-Therapeutic exercises, 97530- Therapeutic activity, [id number]- Neuromuscular re-education, 97535- Self Care, [id number]- Manual therapy, [phone]- Gait training, [phone]- Aquatic Therapy, [phone]- Electrical stimulation (unattended), [phone]- Electrical stimulation (manual), [id number]- Ultrasound, Patient/Family education, Stair training, Taping, Joint mobilization, Cryotherapy, and Moist heat.  79439 (1-2 muscles), [id number] (3+ muscles)- Dry Needling   PLAN FOR NEXT SESSION:  Establish HEP.  Core strengthening.  STM to lumbar paraspinals and possibly glute.  Consider neural flossing   Leigh Minerva III PT, DPT 08/08/2024 5:10 PM   "

## 2024-08-08 ENCOUNTER — Encounter (HOSPITAL_BASED_OUTPATIENT_CLINIC_OR_DEPARTMENT_OTHER): Payer: Self-pay | Admitting: Physical Therapy

## 2024-08-10 ENCOUNTER — Other Ambulatory Visit (HOSPITAL_BASED_OUTPATIENT_CLINIC_OR_DEPARTMENT_OTHER): Payer: Self-pay

## 2024-08-10 ENCOUNTER — Encounter (HOSPITAL_BASED_OUTPATIENT_CLINIC_OR_DEPARTMENT_OTHER): Payer: Self-pay | Admitting: Physical Therapy

## 2024-08-10 ENCOUNTER — Ambulatory Visit (HOSPITAL_BASED_OUTPATIENT_CLINIC_OR_DEPARTMENT_OTHER): Attending: Student | Admitting: Physical Therapy

## 2024-08-10 DIAGNOSIS — R29898 Other symptoms and signs involving the musculoskeletal system: Secondary | ICD-10-CM | POA: Diagnosis present

## 2024-08-10 DIAGNOSIS — M5459 Other low back pain: Secondary | ICD-10-CM | POA: Diagnosis present

## 2024-08-10 DIAGNOSIS — M62838 Other muscle spasm: Secondary | ICD-10-CM | POA: Diagnosis present

## 2024-08-10 DIAGNOSIS — M5412 Radiculopathy, cervical region: Secondary | ICD-10-CM | POA: Insufficient documentation

## 2024-08-10 DIAGNOSIS — M6281 Muscle weakness (generalized): Secondary | ICD-10-CM | POA: Diagnosis present

## 2024-08-10 MED ORDER — EZETIMIBE 10 MG PO TABS
10.0000 mg | ORAL_TABLET | Freq: Every day | ORAL | 3 refills | Status: AC
  Filled 2024-08-17: qty 30, 30d supply, fill #0

## 2024-08-10 MED ORDER — TADALAFIL 5 MG PO TABS
5.0000 mg | ORAL_TABLET | Freq: Every day | ORAL | 3 refills | Status: AC
  Filled 2024-08-17: qty 30, 30d supply, fill #0

## 2024-08-10 NOTE — Therapy (Signed)
 " OUTPATIENT PHYSICAL THERAPY THORACOLUMBAR EVALUATION   Patient Name: Dustin Rice MRN: [id number] DOB:11-05-1955, 69 y.o., male Today's Date: 08/10/2024  END OF SESSION:  PT End of Session - 08/10/24 1437     Visit Number 18    Number of Visits 29    Date for Recertification  09/18/24    Authorization Type MCR A&B    PT Start Time 1430    PT Stop Time 1512    PT Time Calculation (min) 42 min    Activity Tolerance Patient tolerated treatment well    Behavior During Therapy WFL for tasks assessed/performed          Past Medical History:  Diagnosis Date   Allergic rhinitis    Cervical spine disease    Esophageal reflux    GERD (gastroesophageal reflux disease)    Gilbert's syndrome    Hypercholesterolemia    Hyperlipidemia    Inguinal hernia    RIGHT   Prostatitis    RBBB    Shingles 03/2014   Past Surgical History:  Procedure Laterality Date   HERNIA REPAIR  2004   Patient Active Problem List   Diagnosis Date Noted   Benign localized prostatic hyperplasia with lower urinary tract symptoms (LUTS) 04/05/2024   Chronic pain 04/05/2024   Epigastric pain 04/05/2024   Gastroesophageal reflux disease 04/05/2024   Hematochezia 04/05/2024   Hypercholesterolemia 04/05/2024   Internal hemorrhoids 04/05/2024   Intestinal malabsorption 04/05/2024   Lumbago with sciatica, left side 04/05/2024   Screen for colon cancer 04/05/2024   RBBB 07/14/2023   Dyslipidemia 07/14/2023   Elevated coronary artery calcium  score 07/14/2023   Spinal stenosis of lumbar region 05/13/2023   Spondylolisthesis at L4-L5 level 05/13/2023   Palpitations 05/12/2016     REFERRING PROVIDER: Colon Shove, MD   REFERRING DIAG: M43.16 Spondylolisthesis at L4-5  Rationale for Evaluation and Treatment: Rehabilitation  THERAPY DIAG:  Other low back pain  Muscle weakness (generalized)  Other symptoms and signs involving the musculoskeletal system  Radiculopathy, cervical region  Other  muscle spasm  ONSET DATE: Onset Nov. 2021, Exacerbation Nov. 2025  SUBJECTIVE:                                                                                                                                                                                           SUBJECTIVE STATEMENT: The patients back has been hurting a lot over the past few days. Hey feels like the inflammation has been increasing.   Back Eval: Pt reports his pain began in November 2021 with no specific injury. He does remember picking up some heavy tiles a few months  prior though doesn't remember that causing pain.  Pt had PT in 2022 in [place] and didn't receive much relief. He went to see a PT in [place] for 1 visit which did help.  He then received PT for lumbar pain from 12/23 to 06/24.   Pt made great progress and met all goals.  Pt states he was able to go longer before receiving injections.    Pt typically received lumbar injections every 3-5 months.  Pt had a lumbar injection on 11/11 and reports his relief didn't last as long as typical.  Pt fell onto his R side into  the bushes when stepping over some ivy on 07/02/24.  His back pain increased after falling.  Pt states his back was hurting a lot last night.  Yesterday was his worst day.  He wore his back brace earlier today and it helped.  Pt states he is good in sitting and lying down.  He has increased pain with standing.  He has increased pain with standing in the shower.  If he improves his posture, he has improved pain. When he is having N/T he has to stop walking.  He is limited with walking.         PERTINENT HISTORY:  Cervical anterolisthesis  Hx of chronic lumbar pain--spinal stenosis and Grade 1 anterolisthesis L3-L4 and L4-5.  He receives injections.  PAIN:   NPRS:  2/10 current, 10/10 worst, 0/10 best   Location: L > R lumbar flank.  L > R LE, all the way to the foot  Type:  pressure, pushing, N/T Easing Factors:  bending,  sitting Aggravating factors:  standing, lumbar extension  PRECAUTIONS: Other: cervical and lumbar anterolisthesis     WEIGHT BEARING RESTRICTIONS: No  FALLS:  Has patient fallen in last 6 months? Yes. 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   PLOF: Independent  PATIENT GOALS: to improve pain    OBJECTIVE:  Note: Objective measures were completed at Evaluation unless otherwise noted.  DIAGNOSTIC FINDINGS:  X rays in 2021:  Lumbar:  Well-preserved lumbar lordosis and intervertebral disc spaces.  Moderate  to severe lumbar spondylosis    Lumbar MRI in 2022: INDINGS:  #  Grade 1 anterolisthesis L3-4 and L4-5.  #  Vertebral body heights are well maintained.  #  Small meningioma within L2 and L4.  #  Conus terminates at T12-L1 without evidence of tethering.  #  Nerve roots appear normal.  #  Incidental findings: None.     #  L1-2: Normal.  #    #  L2-3: Normal.  #    #  L3-4: Mild degenerative disc disease. Grade 1 anterolisthesis. There is facet arthropathy and disc bulge causing moderate central canal stenosis with mild bilateral neural foraminal narrowing.  #    #  L4-5: Mild degenerative disc disease. Grade 1 anterolisthesis. Facet arthropathy and disc bulge causing moderate to severe central canal stenosis and mild bilateral neural foraminal narrowing.  #    #  L5-S1: Facet arthropathy and disc bulge without significant central canal stenosis or neuroforaminal narrowing.     IMPRESSION:    There is facet arthropathy and disc bulge as well as grade 1 anterolisthesis causing moderate to severe central canal stenosis  at the level of L4-5 and moderate canal stenosis at L3-4.   PATIENT SURVEYS:  Modified Oswestry Disability  Index:  21 = 42%  COGNITION: Overall cognitive status: Within functional limits for tasks assessed     SENSATION: 2+ to LT b/t bilat LE dermatomes  MUSCLE LENGTH: Hamstrings: tightness in bilat   PALPATION: Tenderness to palpation of L sided lower lumbar paraspinals.   Pt has soft tissue tightness in L sided lumbar paraspinals.  R sided mainly WFL with slight tightness in some areas.   LUMBAR ROM:   AROM eval  Flexion WFL  Extension NT  Right lateral flexion 90%  Left lateral flexion WFL  Right rotation 60%  Left rotation 75%   (Blank rows = not tested)  LOWER EXTREMITY MMT:    MMT Right eval Left eval  Hip flexion 5/5 5/5  Hip extension    Hip abduction    Hip adduction    Hip internal rotation    Hip external rotation    Knee flexion 4+/5 seated 5/5 seated  Knee extension 4+/5 5/5  Ankle dorsiflexion 5/5 5/5  Ankle plantarflexion [facility] WFL  Ankle inversion    Ankle eversion     (Blank rows = not tested)  LUMBAR SPECIAL TESTS:  SLR test: negative bilat    TREATMENT:                                                                                                                               1/2  Manual: trigger point release and review of self trigger point release   There-ex:  Gluteal stretch 2x20 sec hold LTR x10  Seated ball roll out x10    Neuro-re-ed:  Supine march 3x10  Hip abduction green 3x10  Bridge 3x10      PT reviewed HEP. PT answered pt's questions concerning exercises and HEP.  Pt performed supine lumbar rotation.   PATIENT EDUCATION:  Education details: dx, objective findings, relevant anatomy, POC, HEP, and exercise form.  PT answered pt's questions. Person educated: Patient Education method: Explanation, Demonstration, Verbal cues, and Handouts Education comprehension: verbalized understanding and returned demonstration  HOME EXERCISE PROGRAM: Pt has a prior HEP.  ASSESSMENT:  CLINICAL IMPRESSION: The patient tolerated  treatment well. We reviewed exercises and how to progress.    Patient is a 69 y.o. male with a dx of lumbar spondylolisthesis who has chronic lumbar pain.  He typically receives lumbar injections every 3-5 months.  Pt had a lumbar injection on 11/11 and reports his relief didn't last as long as typical.  He had an exacerbation of pain when he fell in the bushes onto his R side on 07/02/24.  Pt has pain in L > R lumbar flank and L > R LE all the way to the foot.  He has increased pain with standing including standing in the shower.  Pain improves with improved posture.  He is limited with walking and has to  stop due to N/T in LE.  Pt has limited lumbar AROM, core muscle weakness, and minimal R knee weakness.  He responded very well to PT in 2023 for his back.  Pt should benefit from skilled PT to address impairments and improve overall function.   OBJECTIVE IMPAIRMENTS: decreased activity tolerance, decreased mobility, difficulty walking, decreased ROM, decreased strength, increased fascial restrictions, and pain.   ACTIVITY LIMITATIONS: lifting, standing, bathing, and locomotion level  PARTICIPATION LIMITATIONS: cleaning, shopping, and community activity  PERSONAL FACTORS: Time since onset of injury/illness/exacerbation and 1 comorbidity: cervical pain are also affecting patient's functional outcome.   REHAB POTENTIAL: Good  CLINICAL DECISION MAKING: Stable/uncomplicated  EVALUATION COMPLEXITY: Low   GOALS:  SHORT TERM GOALS: Target date: 08/28/2024   Pt will be independent and compliant with HEP for improved pain, core strength, and function.   Baseline: Goal status: INITIAL  2.  Pt will report at least a 25% improvement in pain and sx's overall.  Baseline:  Goal status: INITIAL  3.  Pt's worst pain will be no > 7/10. Baseline:  Goal status: INITIAL  4.  Pt will demo improved lumbar rotation AROM to be [facility] bilat for improved stiffness and daily mobility.  Baseline:  Goal  status: INITIAL    LONG TERM GOALS: Target date: 09/18/2024   Pt will demo improved core strength as evidenced by performance of core exercises without adverse effects and improved R knee strength to 5/5 MMT for improved performance of and tolerance with functional mobility.  Baseline:  Goal status: INITIAL  2.  Pt will report he is able to ambulate extended community distance without significant lumbar pain and without increased LE pain/sx's. Baseline:  Goal status: INITIAL  3.  Pt will report at least a 70% improvement in pain and sx's overall including with standing activities..  Baseline:  Goal status: INITIAL  4.  Pt will be able to stand to take a shower without increased pain and sx's.   Baseline:  Goal status: INITIAL     PLAN:  PT FREQUENCY: 2x/week  PT DURATION: 6 weeks  PLANNED INTERVENTIONS: 97164- PT Re-evaluation, 97750- Physical Performance Testing, 97110-Therapeutic exercises, 97530- Therapeutic activity, [id number]- Neuromuscular re-education, 97535- Self Care, 02859- Manual therapy, [phone]- Gait training, [phone]- Aquatic Therapy, [phone]- Electrical stimulation (unattended), [phone]- Electrical stimulation (manual), [id number]- Ultrasound, Patient/Family education, Stair training, Taping, Joint mobilization, Cryotherapy, and Moist heat.  79439 (1-2 muscles), 20561 (3+ muscles)- Dry Needling   PLAN FOR NEXT SESSION:  Establish HEP.  Core strengthening.  STM to lumbar paraspinals and possibly glute.  Consider neural flossing   Leigh Minerva III PT, DPT 08/10/2024 2:39 PM   "

## 2024-08-14 ENCOUNTER — Encounter (HOSPITAL_BASED_OUTPATIENT_CLINIC_OR_DEPARTMENT_OTHER): Payer: Self-pay | Admitting: Physical Therapy

## 2024-08-14 ENCOUNTER — Ambulatory Visit (HOSPITAL_BASED_OUTPATIENT_CLINIC_OR_DEPARTMENT_OTHER): Admitting: Physical Therapy

## 2024-08-14 ENCOUNTER — Other Ambulatory Visit (HOSPITAL_BASED_OUTPATIENT_CLINIC_OR_DEPARTMENT_OTHER): Payer: Self-pay

## 2024-08-14 DIAGNOSIS — M62838 Other muscle spasm: Secondary | ICD-10-CM

## 2024-08-14 DIAGNOSIS — M5459 Other low back pain: Secondary | ICD-10-CM | POA: Diagnosis not present

## 2024-08-14 DIAGNOSIS — R29898 Other symptoms and signs involving the musculoskeletal system: Secondary | ICD-10-CM

## 2024-08-14 DIAGNOSIS — M6281 Muscle weakness (generalized): Secondary | ICD-10-CM

## 2024-08-14 DIAGNOSIS — M5412 Radiculopathy, cervical region: Secondary | ICD-10-CM

## 2024-08-14 MED ORDER — TRIAMCINOLONE ACETONIDE 0.1 % EX CREA
1.0000 | TOPICAL_CREAM | Freq: Two times a day (BID) | CUTANEOUS | 3 refills | Status: AC
  Filled 2024-08-14: qty 80, 40d supply, fill #0

## 2024-08-14 MED ORDER — DOXYCYCLINE HYCLATE 100 MG PO TABS
100.0000 mg | ORAL_TABLET | Freq: Two times a day (BID) | ORAL | 1 refills | Status: AC
  Filled 2024-08-14: qty 20, 10d supply, fill #0

## 2024-08-14 MED ORDER — SILVER SULFADIAZINE 1 % EX CREA
1.0000 | TOPICAL_CREAM | Freq: Every day | CUTANEOUS | 3 refills | Status: AC
  Filled 2024-08-14: qty 50, 50d supply, fill #0

## 2024-08-14 MED ORDER — MELOXICAM 15 MG PO TABS
15.0000 mg | ORAL_TABLET | Freq: Every day | ORAL | 6 refills | Status: AC | PRN
  Filled 2024-08-14: qty 30, 30d supply, fill #0

## 2024-08-14 NOTE — Therapy (Signed)
 " OUTPATIENT PHYSICAL THERAPY THORACOLUMBAR EVALUATION   Patient Name: DANIE DIEHL MRN: [id number] DOB:[birth year], 69 y.o., male Today's Date: 08/14/2024  END OF SESSION:  PT End of Session - 08/14/24 1721     Visit Number 19    Number of Visits 29    Date for Recertification  09/18/24    Authorization Type MCR A&B    PT Start Time 1430    PT Stop Time 1513    PT Time Calculation (min) 43 min    Activity Tolerance Patient tolerated treatment well    Behavior During Therapy WFL for tasks assessed/performed           Past Medical History:  Diagnosis Date   Allergic rhinitis    Cervical spine disease    Esophageal reflux    GERD (gastroesophageal reflux disease)    Gilbert's syndrome    Hypercholesterolemia    Hyperlipidemia    Inguinal hernia    RIGHT   Prostatitis    RBBB    Shingles 03/2014   Past Surgical History:  Procedure Laterality Date   HERNIA REPAIR  2004   Patient Active Problem List   Diagnosis Date Noted   Benign localized prostatic hyperplasia with lower urinary tract symptoms (LUTS) 04/05/2024   Chronic pain 04/05/2024   Epigastric pain 04/05/2024   Gastroesophageal reflux disease 04/05/2024   Hematochezia 04/05/2024   Hypercholesterolemia 04/05/2024   Internal hemorrhoids 04/05/2024   Intestinal malabsorption 04/05/2024   Lumbago with sciatica, left side 04/05/2024   Screen for colon cancer 04/05/2024   RBBB 07/14/2023   Dyslipidemia 07/14/2023   Elevated coronary artery calcium  score 07/14/2023   Spinal stenosis of lumbar region 05/13/2023   Spondylolisthesis at L4-L5 level 05/13/2023   Palpitations 05/12/2016     REFERRING PROVIDER: Colon Shove, MD   REFERRING DIAG: M43.16 Spondylolisthesis at L4-5  Rationale for Evaluation and Treatment: Rehabilitation  THERAPY DIAG:  Other low back pain  Muscle weakness (generalized)  Other symptoms and signs involving the musculoskeletal system  Radiculopathy, cervical region  Other  muscle spasm  ONSET DATE: Onset Nov. 2021, Exacerbation Nov. 2025  SUBJECTIVE:                                                                                                                                                                                           SUBJECTIVE STATEMENT: The patient reports his back feels pretty good today.  This pain has been intermittent.  Still feels like it is sore but improving.  Back Eval: Pt reports his pain began in November 2021 with no specific injury. He does remember picking up some heavy tiles  a few months prior though doesn't remember that causing pain.  Pt had PT in 2022 in [place] and didn't receive much relief. He went to see a PT in [place] for 1 visit which did help.  He then received PT for lumbar pain from 12/23 to 06/24.   Pt made great progress and met all goals.  Pt states he was able to go longer before receiving injections.    Pt typically received lumbar injections every 3-5 months.  Pt had a lumbar injection on 11/11 and reports his relief didn't last as long as typical.  Pt fell onto his R side into  the bushes when stepping over some ivy on 07/02/24.  His back pain increased after falling.  Pt states his back was hurting a lot last night.  Yesterday was his worst day.  He wore his back brace earlier today and it helped.  Pt states he is good in sitting and lying down.  He has increased pain with standing.  He has increased pain with standing in the shower.  If he improves his posture, he has improved pain. When he is having N/T he has to stop walking.  He is limited with walking.         PERTINENT HISTORY:  Cervical anterolisthesis  Hx of chronic lumbar pain--spinal stenosis and Grade 1 anterolisthesis L3-L4 and L4-5.  He receives injections.  PAIN:   NPRS:  2/10 current, 10/10 worst, 0/10 best   Location: L > R lumbar flank.  L > R LE, all the way to the foot  Type:  pressure, pushing, N/T Easing Factors:  bending,  sitting Aggravating factors:  standing, lumbar extension  PRECAUTIONS: Other: cervical and lumbar anterolisthesis     WEIGHT BEARING RESTRICTIONS: No  FALLS:  Has patient fallen in last 6 months? Yes. 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   PLOF: Independent  PATIENT GOALS: to improve pain    OBJECTIVE:  Note: Objective measures were completed at Evaluation unless otherwise noted.  DIAGNOSTIC FINDINGS:  X rays in 2021:  Lumbar:  Well-preserved lumbar lordosis and intervertebral disc spaces.  Moderate  to severe lumbar spondylosis    Lumbar MRI in 2022: INDINGS:  #  Grade 1 anterolisthesis L3-4 and L4-5.  #  Vertebral body heights are well maintained.  #  Small meningioma within L2 and L4.  #  Conus terminates at T12-L1 without evidence of tethering.  #  Nerve roots appear normal.  #  Incidental findings: None.     #  L1-2: Normal.  #    #  L2-3: Normal.  #    #  L3-4: Mild degenerative disc disease. Grade 1 anterolisthesis. There is facet arthropathy and disc bulge causing moderate central canal stenosis with mild bilateral neural foraminal narrowing.  #    #  L4-5: Mild degenerative disc disease. Grade 1 anterolisthesis. Facet arthropathy and disc bulge causing moderate to severe central canal stenosis and mild bilateral neural foraminal narrowing.  #    #  L5-S1: Facet arthropathy and disc bulge without significant central canal stenosis or neuroforaminal narrowing.     IMPRESSION:    There is facet arthropathy and disc bulge as well as grade 1 anterolisthesis causing moderate to severe  central canal stenosis at the level of L4-5 and moderate canal stenosis at L3-4.   PATIENT SURVEYS:  Modified Oswestry Disability  Index:  21 = 42%  COGNITION: Overall cognitive status: Within functional limits for tasks assessed     SENSATION: 2+ to LT b/t bilat LE dermatomes  MUSCLE LENGTH: Hamstrings: tightness in bilat   PALPATION: Tenderness to palpation of L sided lower lumbar paraspinals.   Pt has soft tissue tightness in L sided lumbar paraspinals.  R sided mainly WFL with slight tightness in some areas.   LUMBAR ROM:   AROM eval  Flexion WFL  Extension NT  Right lateral flexion 90%  Left lateral flexion WFL  Right rotation 60%  Left rotation 75%   (Blank rows = not tested)  LOWER EXTREMITY MMT:    MMT Right eval Left eval  Hip flexion 5/5 5/5  Hip extension    Hip abduction    Hip adduction    Hip internal rotation    Hip external rotation    Knee flexion 4+/5 seated 5/5 seated  Knee extension 4+/5 5/5  Ankle dorsiflexion 5/5 5/5  Ankle plantarflexion [facility] WFL  Ankle inversion    Ankle eversion     (Blank rows = not tested)  LUMBAR SPECIAL TESTS:  SLR test: negative bilat    TREATMENT:                                                                                                                               1/6 Manual: trigger point release and review of self trigger point release  Bilateral LAD with grade 2 and 3 oscillations  Neuromuscular reeducation: Reviewed seated series of exercises.  All exercises performed with cueing for posture Hip abduction 3x10 2 sets red then moved to green  LAQ 3x12  Ball squeeze with breathing   Updated and reviewed HEP   1/2  Manual: trigger point release and review of self trigger point release   There-ex:  Gluteal stretch 2x20 sec hold LTR x10  Seated ball roll out x10    Neuro-re-ed:  Supine march 3x10  Hip abduction green 3x10  Bridge 3x10      PT reviewed HEP. PT answered pt's questions concerning exercises and HEP.  Pt performed supine lumbar rotation.   PATIENT EDUCATION:  Education details: dx, objective  findings, relevant anatomy, POC, HEP, and exercise form.  PT answered pt's questions. Person educated: Patient Education method: Explanation, Demonstration, Verbal cues, and Handouts Education comprehension: verbalized understanding and returned demonstration  HOME EXERCISE PROGRAM: Pt has a prior HEP.  ASSESSMENT:  CLINICAL IMPRESSION: Therapy reviewed as sitting series of exercises the patient.  We discussed how to grade his exercises.  We discussed posture with the sitting exercises.  He now has a supine and seated series of exercises that he can review for home.  He is advised to make sure his exercises are challenging.  We discussed how to make his  exercises more difficulty if he does not find them challenging.  Therapy reviewed and updated his HEP.  Therapy will continue to progress as tolerated.   Patient is a 69 y.o. male with a dx of lumbar spondylolisthesis who has chronic lumbar pain.  He typically receives lumbar injections every 3-5 months.  Pt had a lumbar injection on 11/11 and reports his relief didn't last as long as typical.  He had an exacerbation of pain when he fell in the bushes onto his R side on 07/02/24.  Pt has pain in L > R lumbar flank and L > R LE all the way to the foot.  He has increased pain with standing including standing in the shower.  Pain improves with improved posture.  He is limited with walking and has to stop due to N/T in LE.  Pt has limited lumbar AROM, core muscle weakness, and minimal R knee weakness.  He responded very well to PT in 2023 for his back.  Pt should benefit from skilled PT to address impairments and improve overall function.   OBJECTIVE IMPAIRMENTS: decreased activity tolerance, decreased mobility, difficulty walking, decreased ROM, decreased strength, increased fascial restrictions, and pain.   ACTIVITY LIMITATIONS: lifting, standing, bathing, and locomotion level  PARTICIPATION LIMITATIONS: cleaning, shopping, and community  activity  PERSONAL FACTORS: Time since onset of injury/illness/exacerbation and 1 comorbidity: cervical pain are also affecting patient's functional outcome.   REHAB POTENTIAL: Good  CLINICAL DECISION MAKING: Stable/uncomplicated  EVALUATION COMPLEXITY: Low   GOALS:  SHORT TERM GOALS: Target date: 08/28/2024   Pt will be independent and compliant with HEP for improved pain, core strength, and function.   Baseline: Goal status: INITIAL  2.  Pt will report at least a 25% improvement in pain and sx's overall.  Baseline:  Goal status: INITIAL  3.  Pt's worst pain will be no > 7/10. Baseline:  Goal status: INITIAL  4.  Pt will demo improved lumbar rotation AROM to be [facility] bilat for improved stiffness and daily mobility.  Baseline:  Goal status: INITIAL    LONG TERM GOALS: Target date: 09/18/2024   Pt will demo improved core strength as evidenced by performance of core exercises without adverse effects and improved R knee strength to 5/5 MMT for improved performance of and tolerance with functional mobility.  Baseline:  Goal status: INITIAL  2.  Pt will report he is able to ambulate extended community distance without significant lumbar pain and without increased LE pain/sx's. Baseline:  Goal status: INITIAL  3.  Pt will report at least a 70% improvement in pain and sx's overall including with standing activities..  Baseline:  Goal status: INITIAL  4.  Pt will be able to stand to take a shower without increased pain and sx's.   Baseline:  Goal status: INITIAL     PLAN:  PT FREQUENCY: 2x/week  PT DURATION: 6 weeks  PLANNED INTERVENTIONS: [id number]- PT Re-evaluation, 97750- Physical Performance Testing, 97110-Therapeutic exercises, 97530- Therapeutic activity, [id number]- Neuromuscular re-education, 97535- Self Care, [id number]- Manual therapy, [phone]- Gait training, [phone]- Aquatic Therapy, [phone]- Electrical stimulation (unattended), [phone]- Electrical stimulation (manual), [id number]-  Ultrasound, Patient/Family education, Stair training, Taping, Joint mobilization, Cryotherapy, and Moist heat.  79439 (1-2 muscles), [id number] (3+ muscles)- Dry Needling   PLAN FOR NEXT SESSION:  Establish HEP.  Core strengthening.  STM to lumbar paraspinals and possibly glute.  Consider neural flossing   Leigh Minerva III PT, DPT 08/14/2024 5:22 PM   "

## 2024-08-15 ENCOUNTER — Encounter (HOSPITAL_BASED_OUTPATIENT_CLINIC_OR_DEPARTMENT_OTHER): Payer: Self-pay | Admitting: Physical Therapy

## 2024-08-17 ENCOUNTER — Other Ambulatory Visit (HOSPITAL_BASED_OUTPATIENT_CLINIC_OR_DEPARTMENT_OTHER): Payer: Self-pay

## 2024-08-17 ENCOUNTER — Telehealth: Payer: Self-pay | Admitting: Cardiology

## 2024-08-17 ENCOUNTER — Encounter (HOSPITAL_BASED_OUTPATIENT_CLINIC_OR_DEPARTMENT_OTHER): Payer: Self-pay | Admitting: Physical Therapy

## 2024-08-17 ENCOUNTER — Other Ambulatory Visit (HOSPITAL_COMMUNITY): Payer: Self-pay

## 2024-08-17 ENCOUNTER — Other Ambulatory Visit: Payer: Self-pay

## 2024-08-17 ENCOUNTER — Ambulatory Visit (HOSPITAL_BASED_OUTPATIENT_CLINIC_OR_DEPARTMENT_OTHER): Admitting: Physical Therapy

## 2024-08-17 DIAGNOSIS — M6281 Muscle weakness (generalized): Secondary | ICD-10-CM

## 2024-08-17 DIAGNOSIS — M5459 Other low back pain: Secondary | ICD-10-CM

## 2024-08-17 DIAGNOSIS — R931 Abnormal findings on diagnostic imaging of heart and coronary circulation: Secondary | ICD-10-CM

## 2024-08-17 DIAGNOSIS — M62838 Other muscle spasm: Secondary | ICD-10-CM

## 2024-08-17 DIAGNOSIS — R29898 Other symptoms and signs involving the musculoskeletal system: Secondary | ICD-10-CM

## 2024-08-17 DIAGNOSIS — E785 Hyperlipidemia, unspecified: Secondary | ICD-10-CM

## 2024-08-17 DIAGNOSIS — M5412 Radiculopathy, cervical region: Secondary | ICD-10-CM

## 2024-08-17 MED ORDER — PRALUENT 75 MG/ML ~~LOC~~ SOAJ
75.0000 mg | SUBCUTANEOUS | 1 refills | Status: AC
  Filled 2024-08-17 (×3): qty 2, 28d supply, fill #0
  Filled 2024-09-13: qty 2, 28d supply, fill #1

## 2024-08-17 NOTE — Telephone Encounter (Signed)
Pt's medication sent to pt's pharmacy as requested. Confirmation received.  °

## 2024-08-17 NOTE — Telephone Encounter (Signed)
" °*  STAT* If patient is at the pharmacy, call can be transferred to refill team.   1. Which medications need to be refilled? (please list name of each medication and dose if known)   Alirocumab  (PRALUENT ) 75 MG/ML SOAJ   2. Would you like to learn more about the convenience, safety, & potential cost savings by using the [facility]?   3. Are you open to using the [facility] (Type [facility]. ).   4. Which pharmacy/location (including street and city if local pharmacy) is medication to be sent to?  [facility]   5. Do they need a 30 day or 90 day supply?   Patient stated he is completely out of this medication and will need to take this medication today.  Patient has appointment scheduled with Dr. Lavona on 4/9. "

## 2024-08-17 NOTE — Therapy (Unsigned)
 " OUTPATIENT PHYSICAL THERAPY THORACOLUMBAR EVALUATION   Patient Name: Dustin Rice MRN: [id number] DOB:05/20/1956, 69 y.o., male Today's Date: 08/17/2024  END OF SESSION:  PT End of Session - 08/17/24 1504     Visit Number 20    Number of Visits 29    Date for Recertification  09/18/24    Authorization Type MCR A&B    PT Start Time 0231    PT Stop Time 0315    PT Time Calculation (min) 44 min    Activity Tolerance Patient tolerated treatment well    Behavior During Therapy [facility] for tasks assessed/performed            Past Medical History:  Diagnosis Date   Allergic rhinitis    Cervical spine disease    Esophageal reflux    GERD (gastroesophageal reflux disease)    Gilbert's syndrome    Hypercholesterolemia    Hyperlipidemia    Inguinal hernia    RIGHT   Prostatitis    RBBB    Shingles 03/2014   Past Surgical History:  Procedure Laterality Date   HERNIA REPAIR  2004   Patient Active Problem List   Diagnosis Date Noted   Benign localized prostatic hyperplasia with lower urinary tract symptoms (LUTS) 04/05/2024   Chronic pain 04/05/2024   Epigastric pain 04/05/2024   Gastroesophageal reflux disease 04/05/2024   Hematochezia 04/05/2024   Hypercholesterolemia 04/05/2024   Internal hemorrhoids 04/05/2024   Intestinal malabsorption 04/05/2024   Lumbago with sciatica, left side 04/05/2024   Screen for colon cancer 04/05/2024   RBBB 07/14/2023   Dyslipidemia 07/14/2023   Elevated coronary artery calcium  score 07/14/2023   Spinal stenosis of lumbar region 05/13/2023   Spondylolisthesis at L4-L5 level 05/13/2023   Palpitations 05/12/2016     REFERRING PROVIDER: Colon Shove, MD   REFERRING DIAG: M43.16 Spondylolisthesis at L4-5  Rationale for Evaluation and Treatment: Rehabilitation  THERAPY DIAG:  Muscle weakness (generalized)  Other low back pain  Other symptoms and signs involving the musculoskeletal system  Radiculopathy, cervical  region  Other muscle spasm  ONSET DATE: Onset Nov. 2021, Exacerbation Nov. 2025  SUBJECTIVE:                                                                                                                                                                                           SUBJECTIVE STATEMENT: The patient reports his back feels pretty good today.  This pain has been intermittent.  Still feels like it is sore but improving.  Back Eval: Pt reports his pain began in November 2021 with no specific injury. He does remember picking up some heavy  tiles a few months prior though doesn't remember that causing pain.  Pt had PT in 2022 in [place] and didn't receive much relief. He went to see a PT in [place] for 1 visit which did help.  He then received PT for lumbar pain from 12/23 to 06/24.   Pt made great progress and met all goals.  Pt states he was able to go longer before receiving injections.    Pt typically received lumbar injections every 3-5 months.  Pt had a lumbar injection on 11/11 and reports his relief didn't last as long as typical.  Pt fell onto his R side into  the bushes when stepping over some ivy on 07/02/24.  His back pain increased after falling.  Pt states his back was hurting a lot last night.  Yesterday was his worst day.  He wore his back brace earlier today and it helped.  Pt states he is good in sitting and lying down.  He has increased pain with standing.  He has increased pain with standing in the shower.  If he improves his posture, he has improved pain. When he is having N/T he has to stop walking.  He is limited with walking.         PERTINENT HISTORY:  Cervical anterolisthesis  Hx of chronic lumbar pain--spinal stenosis and Grade 1 anterolisthesis L3-L4 and L4-5.  He receives injections.  PAIN:   NPRS:  2/10 current, 10/10 worst, 0/10 best   Location: L > R lumbar flank.  L > R LE, all the way to the foot  Type:  pressure, pushing, N/T Easing Factors:   bending, sitting Aggravating factors:  standing, lumbar extension  PRECAUTIONS: Other: cervical and lumbar anterolisthesis     WEIGHT BEARING RESTRICTIONS: No  FALLS:  Has patient fallen in last 6 months? Yes. 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   PLOF: Independent  PATIENT GOALS: to improve pain    OBJECTIVE:  Note: Objective measures were completed at Evaluation unless otherwise noted.  DIAGNOSTIC FINDINGS:  X rays in 2021:  Lumbar:  Well-preserved lumbar lordosis and intervertebral disc spaces.  Moderate  to severe lumbar spondylosis    Lumbar MRI in 2022: INDINGS:  #  Grade 1 anterolisthesis L3-4 and L4-5.  #  Vertebral body heights are well maintained.  #  Small meningioma within L2 and L4.  #  Conus terminates at T12-L1 without evidence of tethering.  #  Nerve roots appear normal.  #  Incidental findings: None.     #  L1-2: Normal.  #    #  L2-3: Normal.  #    #  L3-4: Mild degenerative disc disease. Grade 1 anterolisthesis. There is facet arthropathy and disc bulge causing moderate central canal stenosis with mild bilateral neural foraminal narrowing.  #    #  L4-5: Mild degenerative disc disease. Grade 1 anterolisthesis. Facet arthropathy and disc bulge causing moderate to severe central canal stenosis and mild bilateral neural foraminal narrowing.  #    #  L5-S1: Facet arthropathy and disc bulge without significant central canal stenosis or neuroforaminal narrowing.     IMPRESSION:    There is facet arthropathy and disc bulge as well as grade 1 anterolisthesis causing moderate to  severe central canal stenosis at the level of L4-5 and moderate canal stenosis at L3-4.   PATIENT SURVEYS:  Modified Oswestry  Disability Index:  21 = 42%  COGNITION: Overall cognitive status: Within functional limits for tasks assessed     SENSATION: 2+ to LT b/t bilat LE dermatomes  MUSCLE LENGTH: Hamstrings: tightness in bilat   PALPATION: Tenderness to palpation of L sided lower lumbar paraspinals.   Pt has soft tissue tightness in L sided lumbar paraspinals.  R sided mainly WFL with slight tightness in some areas.   LUMBAR ROM:   AROM eval  Flexion WFL  Extension NT  Right lateral flexion 90%  Left lateral flexion WFL  Right rotation 60%  Left rotation 75%   (Blank rows = not tested)  LOWER EXTREMITY MMT:    MMT Right eval Left eval  Hip flexion 5/5 5/5  Hip extension    Hip abduction    Hip adduction    Hip internal rotation    Hip external rotation    Knee flexion 4+/5 seated 5/5 seated  Knee extension 4+/5 5/5  Ankle dorsiflexion 5/5 5/5  Ankle plantarflexion [facility] WFL  Ankle inversion    Ankle eversion     (Blank rows = not tested)  LUMBAR SPECIAL TESTS:  SLR test: negative bilat    TREATMENT:                                                                                                                               1/9 Nustep 5 minutes Manual: trigger point release and review of self trigger point release  Bilateral LAD with grade 2 and 3 oscillations  Neuromuscular reeducation: Bicep curl 3x12 1 lb Standing Row 3x10 red  Standing Ext 3x10 red  Bilateral ER 3x12 1 lb  - 3 lb caused numbness  Straight shoulder flex 3x10 All with posture and breath cueing   1/6 Manual: trigger point release and review of self trigger point release  Bilateral LAD with grade 2 and 3 oscillations  Neuromuscular reeducation: Reviewed seated series of exercises.  All exercises performed with cueing for posture Hip abduction 3x10 2 sets red then moved to green  LAQ 3x12  Ball squeeze with breathing   Updated and reviewed HEP   1/2  Manual: trigger point release and review of  self trigger point release   There-ex:  Gluteal stretch 2x20 sec hold LTR x10  Seated ball roll out x10    Neuro-re-ed:  Supine march 3x10  Hip abduction green 3x10  Bridge 3x10      PT reviewed HEP. PT answered pt's questions concerning exercises and HEP.  Pt performed supine lumbar rotation.   PATIENT EDUCATION:  Education details: dx, objective findings, relevant anatomy, POC, HEP, and exercise form.  PT answered pt's questions. Person educated: Patient Education method: Explanation, Demonstration, Verbal cues, and Handouts Education comprehension: verbalized understanding and returned demonstration  HOME EXERCISE PROGRAM: Pt has a  prior HEP.  ASSESSMENT:  CLINICAL IMPRESSION: Therapy reviewed a series of exercises for the UE with the emphasis of tightening the core to support the lumbar region. PT focused on breath work and core stabilization. 3lb aggravated the numbness in pt. Right arm and so decreased weight to 1 lb. Pt. Will benefit from skilled neuro-re-ed focused on breath work and core activation.    Patient is a 69 y.o. male with a dx of lumbar spondylolisthesis who has chronic lumbar pain.  He typically receives lumbar injections every 3-5 months.  Pt had a lumbar injection on 11/11 and reports his relief didn't last as long as typical.  He had an exacerbation of pain when he fell in the bushes onto his R side on 07/02/24.  Pt has pain in L > R lumbar flank and L > R LE all the way to the foot.  He has increased pain with standing including standing in the shower.  Pain improves with improved posture.  He is limited with walking and has to stop due to N/T in LE.  Pt has limited lumbar AROM, core muscle weakness, and minimal R knee weakness.  He responded very well to PT in 2023 for his back.  Pt should benefit from skilled PT to address impairments and improve overall function.   OBJECTIVE IMPAIRMENTS: decreased activity tolerance, decreased mobility, difficulty  walking, decreased ROM, decreased strength, increased fascial restrictions, and pain.   ACTIVITY LIMITATIONS: lifting, standing, bathing, and locomotion level  PARTICIPATION LIMITATIONS: cleaning, shopping, and community activity  PERSONAL FACTORS: Time since onset of injury/illness/exacerbation and 1 comorbidity: cervical pain are also affecting patient's functional outcome.   REHAB POTENTIAL: Good  CLINICAL DECISION MAKING: Stable/uncomplicated  EVALUATION COMPLEXITY: Low   GOALS:  SHORT TERM GOALS: Target date: 08/28/2024   Pt will be independent and compliant with HEP for improved pain, core strength, and function.   Baseline: Goal status: INITIAL  2.  Pt will report at least a 25% improvement in pain and sx's overall.  Baseline:  Goal status: INITIAL  3.  Pt's worst pain will be no > 7/10. Baseline:  Goal status: INITIAL  4.  Pt will demo improved lumbar rotation AROM to be [facility] bilat for improved stiffness and daily mobility.  Baseline:  Goal status: INITIAL    LONG TERM GOALS: Target date: 09/18/2024   Pt will demo improved core strength as evidenced by performance of core exercises without adverse effects and improved R knee strength to 5/5 MMT for improved performance of and tolerance with functional mobility.  Baseline:  Goal status: INITIAL  2.  Pt will report he is able to ambulate extended community distance without significant lumbar pain and without increased LE pain/sx's. Baseline:  Goal status: INITIAL  3.  Pt will report at least a 70% improvement in pain and sx's overall including with standing activities..  Baseline:  Goal status: INITIAL  4.  Pt will be able to stand to take a shower without increased pain and sx's.   Baseline:  Goal status: INITIAL     PLAN:  PT FREQUENCY: 2x/week  PT DURATION: 6 weeks  PLANNED INTERVENTIONS: 97164- PT Re-evaluation, 97750- Physical Performance Testing, 97110-Therapeutic exercises, 97530-  Therapeutic activity, [id number]- Neuromuscular re-education, 97535- Self Care, [id number]- Manual therapy, [phone]- Gait training, [phone]- Aquatic Therapy, [phone]- Electrical stimulation (unattended), [phone]- Electrical stimulation (manual), [id number]- Ultrasound, Patient/Family education, Stair training, Taping, Joint mobilization, Cryotherapy, and Moist heat.  79439 (1-2 muscles), 20561 (3+ muscles)- Dry Needling   PLAN  FOR NEXT SESSION:  Establish HEP.  Core strengthening.  STM to lumbar paraspinals and possibly glute.  Consider neural flossing   Leigh Minerva III PT, DPT 08/17/2024 3:12 PM   "

## 2024-08-19 ENCOUNTER — Encounter (HOSPITAL_BASED_OUTPATIENT_CLINIC_OR_DEPARTMENT_OTHER): Payer: Self-pay | Admitting: Physical Therapy

## 2024-08-20 ENCOUNTER — Ambulatory Visit: Admitting: Psychiatry

## 2024-08-20 DIAGNOSIS — F411 Generalized anxiety disorder: Secondary | ICD-10-CM | POA: Diagnosis not present

## 2024-08-20 NOTE — Progress Notes (Signed)
 "       [facility] Counselor/Therapist Progress Note  Patient ID: Dustin Rice, MRN: [id number],    Date: 08/20/2024  Time Spent: 55 minutes   Treatment Type: Individual Therapy  Reported Symptoms:  anxious, family stressors, physical issues    Mental Status Exam:  Appearance:   Casual and Neat     Behavior:  Appropriate, Sharing, and Motivated  Motor:  Normal  Speech/Language:   Clear and Coherent  Affect:  anxious  Mood:  anxious  Thought process:  goal directed  Thought content:    WNL  Sensory/Perceptual disturbances:    WNL  Orientation:  oriented to person, place, time/date, situation, day of week, month of year, year, and stated date of Jan. 12, 2026  Attention:  Good  Concentration:  Good  Memory:  WNL  Fund of knowledge:   Good  Insight:    Good  Judgment:   Good  Impulse Control:  Good   Risk Assessment: Danger to Self:  No Self-injurious Behavior: No Danger to Others: No Duty to Warn:no Physical Aggression / Violence:No  Access to Firearms a concern: No  Gang Involvement:No   Subjective:   Patient working in session today further on his stress, anxiety, worrying, and family issues involving the support and care of his elderly mother with her physical/emotional challenges that continue after a fall several weeks back. Really stressing patient due to the pain and does have some added medication that is helping the pain some. Patient concerned about elderly mom who they recently got some more help through agency for mom's care team. Mother having more difficulties physically and emotionally, and patient helping oversee mother's care and seems to be managing the extra stress. Really having to focus more on his own health and this point due to back and neck issues. Does seem that he and wife and working better together on some personal concerns, and patient focus more on the care of his mom going forward. Communication stressful with him and wife as they both have  some physical issues going on that involve pain. Are getting good medical care and trying to make good decisions. Able to note some progress and some positives physically and within their marriage.    Interventions: Cognitive Behavioral Therapy, Solution-Oriented/Positive Psychology, and Ego-Supportive Long term goal: Reduce overall level, frequency, and intensity of anxiety and depression so that daily functioning is not impaired. Short term goal: Increase understanding of beliefs and messages that produce worry and anxiety. Strategies: Identify and use specific coping strategies for anxiety and depression reduction.  Also explore alternative ways of communicating better with spouse and being more sensitive to needs within the family, including both wife and son.   Diagnosis:   ICD-10-CM   1. Generalized anxiety disorder  F41.1      Plan:   Patient today motivated and working really well in session despite having some physical pain continue and is to see his doctor again later this week.  Patient and wife seem to be working better together has a couple and have renewed faith that their relationship can go in a more positive direction and seems to be mutually supportive of each other as they both have their physical/emotional challenges.  Patient notes some specific progress as they are both working on their marriage and interpersonal relationship.  Communication improved some.  Shared some updated information regarding adult son that he has had some concerns about.  Patient committed to working on family related issues and relationships,  including some within the extended family especially in the care of their elderly mother who after having a fall is having more confusion than she was previously, requiring more close attention.  Patient does seem to feel family is pulling together more and feeling positive about it. Continue to encourage patient in working on his goals and self-affirming behaviors  as noted in sessions including: Remaining in touch with supportive people, being more committed to ongoing work with his treatment goals, increase self-awareness and commitment to personal health goals, taking breaks as needed, positive self-talk and self-care, healthy nutrition and exercise, staying in the present focused on the things he can control versus cannot, allow his faith to be an emotional support as well as spiritual, continue making his family a high priority including himself, and recognize the strength he shows as he works with goal-directed behaviors that help him to move in a more positive direction and support his overall emotional health and outlook going forward into the future.  Dustin Rice does recognize that he has made significant progress and needs to continue working with goal-directed behaviors that are helping him move in a more hopeful and healthier direction.  Goal review and progress/challenges noted with patient.  Next appointment within 3 weeks.   Barnie Bunde, LCSW                   "

## 2024-08-21 ENCOUNTER — Ambulatory Visit (HOSPITAL_BASED_OUTPATIENT_CLINIC_OR_DEPARTMENT_OTHER): Admitting: Physical Therapy

## 2024-08-21 ENCOUNTER — Other Ambulatory Visit (HOSPITAL_BASED_OUTPATIENT_CLINIC_OR_DEPARTMENT_OTHER): Payer: Self-pay

## 2024-08-21 DIAGNOSIS — M5459 Other low back pain: Secondary | ICD-10-CM | POA: Diagnosis not present

## 2024-08-21 DIAGNOSIS — M6281 Muscle weakness (generalized): Secondary | ICD-10-CM

## 2024-08-21 DIAGNOSIS — R29898 Other symptoms and signs involving the musculoskeletal system: Secondary | ICD-10-CM

## 2024-08-21 NOTE — Therapy (Signed)
 " OUTPATIENT PHYSICAL THERAPY THORACOLUMBAR TREATMENT   Patient Name: Dustin Rice MRN: [id number] DOB:04/18/1956, 69 y.o., male Today's Date: 08/22/2024  END OF SESSION:  PT End of Session - 08/21/24 1457     Visit Number 21    Number of Visits 29    Date for Recertification  09/18/24    Authorization Type MCR A&B    PT Start Time 1453    PT Stop Time 1540    PT Time Calculation (min) 47 min    Activity Tolerance Patient tolerated treatment well    Behavior During Therapy [facility] for tasks assessed/performed            Past Medical History:  Diagnosis Date   Allergic rhinitis    Cervical spine disease    Esophageal reflux    GERD (gastroesophageal reflux disease)    Gilbert's syndrome    Hypercholesterolemia    Hyperlipidemia    Inguinal hernia    RIGHT   Prostatitis    RBBB    Shingles 03/2014   Past Surgical History:  Procedure Laterality Date   HERNIA REPAIR  2004   Patient Active Problem List   Diagnosis Date Noted   Benign localized prostatic hyperplasia with lower urinary tract symptoms (LUTS) 04/05/2024   Chronic pain 04/05/2024   Epigastric pain 04/05/2024   Gastroesophageal reflux disease 04/05/2024   Hematochezia 04/05/2024   Hypercholesterolemia 04/05/2024   Internal hemorrhoids 04/05/2024   Intestinal malabsorption 04/05/2024   Lumbago with sciatica, left side 04/05/2024   Screen for colon cancer 04/05/2024   RBBB 07/14/2023   Dyslipidemia 07/14/2023   Elevated coronary artery calcium  score 07/14/2023   Spinal stenosis of lumbar region 05/13/2023   Spondylolisthesis at L4-L5 level 05/13/2023   Palpitations 05/12/2016     REFERRING PROVIDER: Colon Shove, MD   REFERRING DIAG: M43.16 Spondylolisthesis at L4-5  Rationale for Evaluation and Treatment: Rehabilitation  THERAPY DIAG:  Other low back pain  Muscle weakness (generalized)  Other symptoms and signs involving the musculoskeletal system  ONSET DATE: Onset Nov. 2021,  Exacerbation Nov. 2025  SUBJECTIVE:                                                                                                                                                                                           SUBJECTIVE STATEMENT: Pt sees Dr. Colon on Friday.  Pt states he has had a rough day today having soreness in cervical and lumbar.  Pt had to stop frequently while walking at [employer] due to back pain earlier today.  Pt had an 8/10 pain in his lumbar.  PERTINENT HISTORY:  Cervical anterolisthesis  Hx of chronic lumbar pain--spinal stenosis and Grade 1 anterolisthesis L3-L4 and L4-5.  He receives injections.  PAIN:   NPRS:  1/10 current, 10/10 worst, 0/10 best   Location: L > R lumbar flank.  L > R LE, all the way to the foot  Type:  pressure, pushing, N/T Easing Factors:  bending, sitting Aggravating factors:  standing, lumbar extension  4/10 cervical pain  PRECAUTIONS: Other: cervical and lumbar anterolisthesis     WEIGHT BEARING RESTRICTIONS: No  FALLS:  Has patient fallen in last 6 months? Yes. 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   PLOF: Independent  PATIENT GOALS: to improve pain    OBJECTIVE:  Note: Objective measures were completed at Evaluation unless otherwise noted.  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  Adjacent-level deg changes, causing severe C4-5 spinal canal stenosis with spinal cord compression.  Associated abnormal cord signal, representing myelomalacia or cord edema.   Moderate C2-3,  moderate to severe C3-4, and moderate to severe C6-7 spinal canal stenosis Moderate and severe neural foraminal stenosis at C2-3, C3-4, C4-5, and C6-7 described above. Multilevel degenerative listhesis.       X rays in 2021:  Lumbar:  Well-preserved lumbar lordosis and intervertebral disc spaces.  Moderate  to severe lumbar spondylosis    Lumbar MRI in 2022: INDINGS:  #  Grade 1 anterolisthesis L3-4 and L4-5.  #  Vertebral body heights are well maintained.  #  Small meningioma within L2 and L4.  #  Conus terminates at T12-L1 without evidence of tethering.  #  Nerve roots appear normal.  #  Incidental findings: None.     #  L1-2: Normal.  #    #  L2-3: Normal.  #    #  L3-4: Mild degenerative disc disease. Grade 1 anterolisthesis. There is facet arthropathy and disc bulge causing moderate central canal stenosis with mild bilateral neural foraminal narrowing.  #    #  L4-5: Mild degenerative disc disease. Grade 1 anterolisthesis. Facet arthropathy and disc bulge causing moderate to severe central canal stenosis and mild bilateral neural foraminal narrowing.  #    #  L5-S1: Facet arthropathy and disc bulge without significant central canal stenosis or neuroforaminal narrowing.     IMPRESSION:    There is facet arthropathy and disc bulge as well as grade 1 anterolisthesis causing moderate to severe central canal stenosis at the level of L4-5 and moderate canal stenosis at L3-4.   PATIENT SURVEYS:  Modified Oswestry Disability Index:  21 = 42%  COGNITION: Overall cognitive status: Within functional limits for tasks assessed     SENSATION: 2+ to LT b/t bilat LE dermatomes  MUSCLE LENGTH: Hamstrings: tightness in bilat   PALPATION: Tenderness to palpation of L sided lower lumbar paraspinals.   Pt has soft tissue tightness in L sided lumbar paraspinals.  R sided mainly WFL with slight tightness in some areas.   LUMBAR ROM:   AROM eval  Flexion WFL  Extension NT  Right  lateral flexion 90%  Left lateral flexion WFL  Right rotation  60%  Left rotation 75%   (Blank rows = not tested)  LOWER EXTREMITY MMT:    MMT Right eval Left eval  Hip flexion 5/5 5/5  Hip extension    Hip abduction    Hip adduction    Hip internal rotation    Hip external rotation    Knee flexion 4+/5 seated 5/5 seated  Knee extension 4+/5 5/5  Ankle dorsiflexion 5/5 5/5  Ankle plantarflexion [facility] WFL  Ankle inversion    Ankle eversion     (Blank rows = not tested)  LUMBAR SPECIAL TESTS:  SLR test: negative bilat    TREATMENT:                                                                                                                               1/13 Nustep x 5 minutes lvl 5 bilat UE/LE's Standing rows with TrA with RTB 3x10 Standing shoulder extension with RTB with TrA 3x10 Supine bridge with TrA 2x10 Supine lumbar rotation 2x10 Supine PPT 2x10 Supine clams with GTB with TrA 2x10 Supine piriformis stretch 2x30 sec bilat  Manual Therapy:  Pt received STM to bilat lumbar paraspinals in S/L'ing with pillow b/w knees.  1/9 Nustep 5 minutes Manual: trigger point release and review of self trigger point release  Bilateral LAD with grade 2 and 3 oscillations  Neuromuscular reeducation: Bicep curl 3x12 1 lb Standing Row 3x10 red  Standing Ext 3x10 red  Bilateral ER 3x12 1 lb  - 3 lb caused numbness  Straight shoulder flex 3x10 All with posture and breath cueing   1/6 Manual: trigger point release and review of self trigger point release  Bilateral LAD with grade 2 and 3 oscillations  Neuromuscular reeducation: Reviewed seated series of exercises.  All exercises performed with cueing for posture Hip abduction 3x10 2 sets red then moved to green  LAQ 3x12  Ball squeeze with breathing   Updated and reviewed HEP   1/2  Manual: trigger point release and review of self trigger point release   There-ex:  Gluteal stretch 2x20 sec hold LTR x10  Seated  ball roll out x10    Neuro-re-ed:  Supine march 3x10  Hip abduction green 3x10  Bridge 3x10      PT reviewed HEP. PT answered pt's questions concerning exercises and HEP.  Pt performed supine lumbar rotation.   PATIENT EDUCATION:  Education details: dx, rationale of interventions, relevant anatomy, POC, HEP, and exercise form.  PT answered pt's questions. Person educated: Patient Education method: Explanation, Demonstration, Verbal cues, and Handouts Education comprehension: verbalized understanding and returned demonstration  HOME EXERCISE PROGRAM: Pt has a prior HEP.  ASSESSMENT:  CLINICAL IMPRESSION: Pt presents to treatment stating his back was bothering him earlier which caused him difficulty with ambulating in [employer].  Pt brought in his cervical MRI and PT reviewed it.  See above for MRI findings.  Pt performed exercises well with cuing and instruction in  correct form.  PT performed exercises focused on core and postural strength and stability, glute strength, and lumbar mobility.  Pt had good tolerance with exercises.  PT performed STM to improve soft tissue tightness and and mobility and pain and reduce myofascial restrictions.  He responded well to treatment having no increased pain and no c/o's after treatment.  Pt should benefit from continued skilled PT to address impairments and goals and improve overall function.   OBJECTIVE IMPAIRMENTS: decreased activity tolerance, decreased mobility, difficulty walking, decreased ROM, decreased strength, increased fascial restrictions, and pain.   ACTIVITY LIMITATIONS: lifting, standing, bathing, and locomotion level  PARTICIPATION LIMITATIONS: cleaning, shopping, and community activity  PERSONAL FACTORS: Time since onset of injury/illness/exacerbation and 1 comorbidity: cervical pain are also affecting patient's functional outcome.   REHAB POTENTIAL: Good  CLINICAL DECISION MAKING: Stable/uncomplicated  EVALUATION COMPLEXITY:  Low   GOALS:  SHORT TERM GOALS: Target date: 08/28/2024   Pt will be independent and compliant with HEP for improved pain, core strength, and function.   Baseline: Goal status: INITIAL  2.  Pt will report at least a 25% improvement in pain and sx's overall.  Baseline:  Goal status: INITIAL  3.  Pt's worst pain will be no > 7/10. Baseline:  Goal status: INITIAL  4.  Pt will demo improved lumbar rotation AROM to be [facility] bilat for improved stiffness and daily mobility.  Baseline:  Goal status: INITIAL    LONG TERM GOALS: Target date: 09/18/2024   Pt will demo improved core strength as evidenced by performance of core exercises without adverse effects and improved R knee strength to 5/5 MMT for improved performance of and tolerance with functional mobility.  Baseline:  Goal status: INITIAL  2.  Pt will report he is able to ambulate extended community distance without significant lumbar pain and without increased LE pain/sx's. Baseline:  Goal status: INITIAL  3.  Pt will report at least a 70% improvement in pain and sx's overall including with standing activities..  Baseline:  Goal status: INITIAL  4.  Pt will be able to stand to take a shower without increased pain and sx's.   Baseline:  Goal status: INITIAL     PLAN:  PT FREQUENCY: 2x/week  PT DURATION: 6 weeks  PLANNED INTERVENTIONS: 97164- PT Re-evaluation, 97750- Physical Performance Testing, 97110-Therapeutic exercises, 97530- Therapeutic activity, [id number]- Neuromuscular re-education, 97535- Self Care, 02859- Manual therapy, [phone]- Gait training, [phone]- Aquatic Therapy, [phone]- Electrical stimulation (unattended), [phone]- Electrical stimulation (manual), [id number]- Ultrasound, Patient/Family education, Stair training, Taping, Joint mobilization, Cryotherapy, and Moist heat.  79439 (1-2 muscles), 20561 (3+ muscles)- Dry Needling   PLAN FOR NEXT SESSION:  Establish HEP.  Core strengthening.  STM to lumbar paraspinals and  possibly glute.  Consider neural flossing   Leigh Minerva III PT, DPT 08/22/24 11:44 PM      "

## 2024-08-22 ENCOUNTER — Encounter (HOSPITAL_BASED_OUTPATIENT_CLINIC_OR_DEPARTMENT_OTHER): Payer: Self-pay | Admitting: Physical Therapy

## 2024-08-23 ENCOUNTER — Ambulatory Visit (HOSPITAL_BASED_OUTPATIENT_CLINIC_OR_DEPARTMENT_OTHER): Admitting: Physical Therapy

## 2024-08-23 ENCOUNTER — Encounter (HOSPITAL_BASED_OUTPATIENT_CLINIC_OR_DEPARTMENT_OTHER): Payer: Self-pay | Admitting: Physical Therapy

## 2024-08-23 DIAGNOSIS — M5459 Other low back pain: Secondary | ICD-10-CM | POA: Diagnosis not present

## 2024-08-23 DIAGNOSIS — M5412 Radiculopathy, cervical region: Secondary | ICD-10-CM

## 2024-08-23 DIAGNOSIS — M6281 Muscle weakness (generalized): Secondary | ICD-10-CM

## 2024-08-23 NOTE — Therapy (Signed)
 " OUTPATIENT PHYSICAL THERAPY THORACOLUMBAR TREATMENT   Patient Name: Dustin Rice MRN: [id number] DOB:1956/04/15, 69 y.o., male Today's Date: 08/24/2024  END OF SESSION:  PT End of Session - 08/23/24 1024     Visit Number 22    Number of Visits 29    Date for Recertification  09/18/24    Authorization Type MCR A&B    PT Start Time 1018    PT Stop Time 1100    PT Time Calculation (min) 42 min    Activity Tolerance Patient tolerated treatment well    Behavior During Therapy WFL for tasks assessed/performed             Past Medical History:  Diagnosis Date   Allergic rhinitis    Cervical spine disease    Esophageal reflux    GERD (gastroesophageal reflux disease)    Gilbert's syndrome    Hypercholesterolemia    Hyperlipidemia    Inguinal hernia    RIGHT   Prostatitis    RBBB    Shingles 03/2014   Past Surgical History:  Procedure Laterality Date   HERNIA REPAIR  2004   Patient Active Problem List   Diagnosis Date Noted   Benign localized prostatic hyperplasia with lower urinary tract symptoms (LUTS) 04/05/2024   Chronic pain 04/05/2024   Epigastric pain 04/05/2024   Gastroesophageal reflux disease 04/05/2024   Hematochezia 04/05/2024   Hypercholesterolemia 04/05/2024   Internal hemorrhoids 04/05/2024   Intestinal malabsorption 04/05/2024   Lumbago with sciatica, left side 04/05/2024   Screen for colon cancer 04/05/2024   RBBB 07/14/2023   Dyslipidemia 07/14/2023   Elevated coronary artery calcium  score 07/14/2023   Spinal stenosis of lumbar region 05/13/2023   Spondylolisthesis at L4-L5 level 05/13/2023   Palpitations 05/12/2016     REFERRING PROVIDER: Colon Shove, MD   REFERRING DIAG: M43.16 Spondylolisthesis at L4-5  Rationale for Evaluation and Treatment: Rehabilitation  THERAPY DIAG:  Other low back pain  Muscle weakness (generalized)  Radiculopathy, cervical region  ONSET DATE: Onset Nov. 2021, Exacerbation Nov. 2025  SUBJECTIVE:                                                                                                                                                                                            SUBJECTIVE STATEMENT: Pt had soreness after last session. States that he's having a lot of pain and stiffness at night after being up and about during the day and in the mornings. Took some tylenol this morning and did some stretching. Pain and soreness in both neck and back but states that back is worse than neck. Is careful to  avoid activity that aggravates it but it's hard to avoid aggravating activity when just being on his feet causes that soreness.   Still plans to see doc tomorrow - originally made appointment for neck but plans to talk about the back too. Reviewing MRI of neck. As of now, not scheduled to have another injection till February but will see if he will get one from tomorrow.   PERTINENT HISTORY:  Cervical anterolisthesis  Hx of chronic lumbar pain--spinal stenosis and Grade 1 anterolisthesis L3-L4 and L4-5.  He receives injections.  PAIN:   NPRS:  1/10 current, 10/10 worst, 0/10 best   Location: L > R lumbar flank.  L > R LE, all the way to the foot  Type:  pressure, pushing, N/T Easing Factors:  bending, sitting Aggravating factors:  standing, lumbar extension  4/10 cervical pain  PRECAUTIONS: Other: cervical and lumbar anterolisthesis     WEIGHT BEARING RESTRICTIONS: No  FALLS:  Has patient fallen in last 6 months? Yes. 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   PLOF: Independent  PATIENT GOALS: to improve pain    OBJECTIVE:  Note: Objective measures were completed at Evaluation unless otherwise noted.  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  Adjacent-level deg changes, causing severe C4-5 spinal canal stenosis with spinal cord compression.  Associated abnormal cord signal, representing myelomalacia or cord edema.   Moderate C2-3, moderate to severe C3-4, and moderate to severe C6-7 spinal canal stenosis Moderate and severe neural foraminal stenosis at C2-3, C3-4, C4-5, and C6-7 described above. Multilevel degenerative listhesis.       X rays in 2021:  Lumbar:  Well-preserved lumbar lordosis and intervertebral disc spaces.  Moderate  to severe lumbar spondylosis    Lumbar MRI in 2022: INDINGS:  #  Grade 1 anterolisthesis L3-4 and L4-5.  #  Vertebral body heights are well maintained.  #  Small meningioma within L2 and L4.  #  Conus terminates at T12-L1 without evidence of tethering.  #  Nerve roots appear normal.  #  Incidental findings: None.     #  L1-2: Normal.  #    #  L2-3: Normal.  #    #  L3-4: Mild degenerative disc disease. Grade 1 anterolisthesis. There is facet arthropathy and disc bulge causing moderate central canal stenosis with mild bilateral neural foraminal narrowing.  #    #  L4-5: Mild degenerative disc disease. Grade 1 anterolisthesis. Facet arthropathy and disc bulge causing moderate to severe central canal stenosis and mild bilateral neural foraminal narrowing.  #    #  L5-S1: Facet arthropathy and disc bulge without significant central canal stenosis or neuroforaminal narrowing.     IMPRESSION:    There is facet arthropathy and disc bulge as well as grade 1 anterolisthesis causing moderate to severe central canal stenosis at the level of L4-5 and moderate canal stenosis at L3-4.   PATIENT SURVEYS:  Modified Oswestry Disability Index:  21 = 42%  COGNITION: Overall cognitive status:  Within functional limits for tasks assessed     SENSATION: 2+ to LT b/t bilat LE  dermatomes  MUSCLE LENGTH: Hamstrings: tightness in bilat   PALPATION: Tenderness to palpation of L sided lower lumbar paraspinals.   Pt has soft tissue tightness in L sided lumbar paraspinals.  R sided mainly WFL with slight tightness in some areas.   LUMBAR ROM:   AROM eval  Flexion WFL  Extension NT  Right lateral flexion 90%  Left lateral flexion WFL  Right rotation 60%  Left rotation 75%   (Blank rows = not tested)  LOWER EXTREMITY MMT:    MMT Right eval Left eval  Hip flexion 5/5 5/5  Hip extension    Hip abduction    Hip adduction    Hip internal rotation    Hip external rotation    Knee flexion 4+/5 seated 5/5 seated  Knee extension 4+/5 5/5  Ankle dorsiflexion 5/5 5/5  Ankle plantarflexion [facility] WFL  Ankle inversion    Ankle eversion     (Blank rows = not tested)  LUMBAR SPECIAL TESTS:  SLR test: negative bilat    TREATMENT:                                                                                                                               1/15  There-ex:   Nustep x 5 minutes lvl 5 bilat UE/LE's Seated abd 3x12 GTB Seated rows 3x12 BTB STS 3x10 - had to elevate the surface to help dec pain  Bicep curls 3x12 1 lb LAQ 2x10 bilat  Manual Therapy:  Pt received STM to bilat lumbar paraspinals in S/L'ing with pillow b/w knees.  1/13 Nustep x 5 minutes lvl 5 bilat UE/LE's Standing rows with TrA with RTB 3x10 Standing shoulder extension with RTB with TrA 3x10 Supine bridge with TrA 2x10 Supine lumbar rotation 2x10 Supine PPT 2x10 Supine clams with GTB with TrA 2x10 Supine piriformis stretch 2x30 sec bilat  Manual Therapy:  Pt received STM to bilat lumbar paraspinals in S/L'ing with pillow b/w knees.  1/9 Nustep 5 minutes Manual: trigger point release and review of self trigger point release  Bilateral LAD with grade 2 and 3 oscillations  Neuromuscular  reeducation: Bicep curl 3x12 1 lb Standing Row 3x10 red  Standing Ext 3x10 red  Bilateral ER 3x12 1 lb  - 3 lb caused numbness  Straight shoulder flex 3x10 All with posture and breath cueing   1/6 Manual: trigger point release and review of self trigger point release  Bilateral LAD with grade 2 and 3 oscillations  Neuromuscular reeducation: Reviewed seated series of exercises.  All exercises performed with cueing for posture Hip abduction 3x10 2 sets red then moved to green  LAQ 3x12  Ball squeeze with breathing   Updated and reviewed HEP   1/2  Manual: trigger point release and review of self trigger point release   There-ex:  Gluteal stretch 2x20 sec hold LTR x10  Seated ball roll out x10    Neuro-re-ed:  Supine march 3x10  Hip abduction green 3x10  Bridge 3x10      PT reviewed HEP. PT answered pt's questions concerning exercises and HEP.  Pt performed supine lumbar rotation.   PATIENT EDUCATION:  Education details: dx, rationale of interventions, relevant anatomy, POC, HEP, and exercise form.  PT answered pt's questions. Person educated: Patient Education method: Explanation, Demonstration, Verbal cues, and Handouts Education comprehension: verbalized understanding and returned demonstration  HOME EXERCISE PROGRAM: [id number] neck  ASSESSMENT:  CLINICAL IMPRESSION: Pt came in with some pain and soreness today. PT focused on exercise consistency and tried to target UE and LE systems with core activation. Pt's back flared up during STS but flare subsided when PT elevated table. Next session, PT will follow up on pt Dr visit re: cervical region to help guide future exercise plans. Pt is progressing overall and will benefit from continuation of tx.   OBJECTIVE IMPAIRMENTS: decreased activity tolerance, decreased mobility, difficulty walking, decreased ROM, decreased strength, increased fascial restrictions, and pain.   ACTIVITY LIMITATIONS: lifting, standing,  bathing, and locomotion level  PARTICIPATION LIMITATIONS: cleaning, shopping, and community activity  PERSONAL FACTORS: Time since onset of injury/illness/exacerbation and 1 comorbidity: cervical pain are also affecting patient's functional outcome.   REHAB POTENTIAL: Good  CLINICAL DECISION MAKING: Stable/uncomplicated  EVALUATION COMPLEXITY: Low   GOALS:  SHORT TERM GOALS: Target date: 08/28/2024   Pt will be independent and compliant with HEP for improved pain, core strength, and function.   Baseline: Goal status: INITIAL  2.  Pt will report at least a 25% improvement in pain and sx's overall.  Baseline:  Goal status: INITIAL  3.  Pt's worst pain will be no > 7/10. Baseline:  Goal status: INITIAL  4.  Pt will demo improved lumbar rotation AROM to be [facility] bilat for improved stiffness and daily mobility.  Baseline:  Goal status: INITIAL    LONG TERM GOALS: Target date: 09/18/2024   Pt will demo improved core strength as evidenced by performance of core exercises without adverse effects and improved R knee strength to 5/5 MMT for improved performance of and tolerance with functional mobility.  Baseline:  Goal status: INITIAL  2.  Pt will report he is able to ambulate extended community distance without significant lumbar pain and without increased LE pain/sx's. Baseline:  Goal status: INITIAL  3.  Pt will report at least a 70% improvement in pain and sx's overall including with standing activities..  Baseline:  Goal status: INITIAL  4.  Pt will be able to stand to take a shower without increased pain and sx's.   Baseline:  Goal status: INITIAL     PLAN:  PT FREQUENCY: 2x/week  PT DURATION: 6 weeks  PLANNED INTERVENTIONS: 97164- PT Re-evaluation, 97750- Physical Performance Testing, 97110-Therapeutic exercises, 97530- Therapeutic activity, [id number]- Neuromuscular re-education, 97535- Self Care, 02859- Manual therapy, [phone]- Gait training, [phone]- Aquatic  Therapy, [phone]- Electrical stimulation (unattended), [phone]- Electrical stimulation (manual), [id number]- Ultrasound, Patient/Family education, Stair training, Taping, Joint mobilization, Cryotherapy, and Moist heat.  79439 (1-2 muscles), 20561 (3+ muscles)- Dry Needling   PLAN FOR NEXT SESSION:  Establish HEP.  Core strengthening.  STM to lumbar paraspinals and possibly glute.  Consider neural flossing   Nyoka Pearson, SPT  08/24/24 2:48 PM      "

## 2024-08-24 ENCOUNTER — Encounter (HOSPITAL_BASED_OUTPATIENT_CLINIC_OR_DEPARTMENT_OTHER): Admitting: Physical Therapy

## 2024-08-24 ENCOUNTER — Encounter (HOSPITAL_BASED_OUTPATIENT_CLINIC_OR_DEPARTMENT_OTHER): Payer: Self-pay | Admitting: Physical Therapy

## 2024-08-29 ENCOUNTER — Ambulatory Visit (HOSPITAL_BASED_OUTPATIENT_CLINIC_OR_DEPARTMENT_OTHER): Admitting: Physical Therapy

## 2024-08-29 ENCOUNTER — Encounter (HOSPITAL_BASED_OUTPATIENT_CLINIC_OR_DEPARTMENT_OTHER): Payer: Self-pay | Admitting: Physical Therapy

## 2024-08-29 DIAGNOSIS — M5459 Other low back pain: Secondary | ICD-10-CM | POA: Diagnosis not present

## 2024-08-29 DIAGNOSIS — M5412 Radiculopathy, cervical region: Secondary | ICD-10-CM

## 2024-08-29 DIAGNOSIS — R29898 Other symptoms and signs involving the musculoskeletal system: Secondary | ICD-10-CM

## 2024-08-29 DIAGNOSIS — M62838 Other muscle spasm: Secondary | ICD-10-CM

## 2024-08-29 DIAGNOSIS — M6281 Muscle weakness (generalized): Secondary | ICD-10-CM

## 2024-08-29 NOTE — Therapy (Signed)
 " OUTPATIENT PHYSICAL THERAPY THORACOLUMBAR TREATMENT   Patient Name: Dustin Rice MRN: [id number] DOB:[birth year], 69 y.o., male Today's Date: 08/29/2024  END OF SESSION:  PT End of Session - 08/29/24 1340     Visit Number 23    Number of Visits 29    Date for Recertification  09/18/24    Authorization Type MCR A&B    PT Start Time 1302    PT Stop Time 1345    PT Time Calculation (min) 43 min    Activity Tolerance Patient tolerated treatment well    Behavior During Therapy WFL for tasks assessed/performed              Past Medical History:  Diagnosis Date   Allergic rhinitis    Cervical spine disease    Esophageal reflux    GERD (gastroesophageal reflux disease)    Gilbert's syndrome    Hypercholesterolemia    Hyperlipidemia    Inguinal hernia    RIGHT   Prostatitis    RBBB    Shingles 03/2014   Past Surgical History:  Procedure Laterality Date   HERNIA REPAIR  2004   Patient Active Problem List   Diagnosis Date Noted   Benign localized prostatic hyperplasia with lower urinary tract symptoms (LUTS) 04/05/2024   Chronic pain 04/05/2024   Epigastric pain 04/05/2024   Gastroesophageal reflux disease 04/05/2024   Hematochezia 04/05/2024   Hypercholesterolemia 04/05/2024   Internal hemorrhoids 04/05/2024   Intestinal malabsorption 04/05/2024   Lumbago with sciatica, left side 04/05/2024   Screen for colon cancer 04/05/2024   RBBB 07/14/2023   Dyslipidemia 07/14/2023   Elevated coronary artery calcium  score 07/14/2023   Spinal stenosis of lumbar region 05/13/2023   Spondylolisthesis at L4-L5 level 05/13/2023   Palpitations 05/12/2016     REFERRING PROVIDER: Colon Shove, MD   REFERRING DIAG: M43.16 Spondylolisthesis at L4-5  Rationale for Evaluation and Treatment: Rehabilitation  THERAPY DIAG:  Other low back pain  Muscle weakness (generalized)  Radiculopathy, cervical region  Other symptoms and signs involving the musculoskeletal  system  Other muscle spasm  ONSET DATE: Onset Nov. 2021, Exacerbation Nov. 2025  SUBJECTIVE:                                                                                                                                                                                           SUBJECTIVE STATEMENT: Pt is scheduled to get an injection for his lower back on Feb 12 and neck surgery on Feb 17th. Back felt good for a bit but yesterday was a little rough. This morning it felt good when he woke up and then it  stiffened up. Loosened up after ice.   Still plans to see doc tomorrow - originally made appointment for neck but plans to talk about the back too. Reviewing MRI of neck. As of now, not scheduled to have another injection till February but will see if he will get one from tomorrow.   PERTINENT HISTORY:  Cervical anterolisthesis  Hx of chronic lumbar pain--spinal stenosis and Grade 1 anterolisthesis L3-L4 and L4-5.  He receives injections.  PAIN:   NPRS:  1/10 current, 10/10 worst, 0/10 best   Location: L > R lumbar flank.  L > R LE, all the way to the foot  Type:  pressure, pushing, N/T Easing Factors:  bending, sitting Aggravating factors:  standing, lumbar extension  4/10 cervical pain  PRECAUTIONS: Other: cervical and lumbar anterolisthesis     WEIGHT BEARING RESTRICTIONS: No  FALLS:  Has patient fallen in last 6 months? Yes. 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   PLOF: Independent  PATIENT GOALS: to improve pain    OBJECTIVE:  Note: Objective measures were completed at Evaluation unless otherwise noted.  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  Adjacent-level deg changes, causing severe C4-5 spinal canal stenosis with spinal cord compression.  Associated abnormal cord signal, representing myelomalacia or cord edema.   Moderate C2-3, moderate to severe C3-4, and moderate to severe C6-7 spinal canal stenosis Moderate and severe neural foraminal stenosis at C2-3, C3-4, C4-5, and C6-7 described above. Multilevel degenerative listhesis.       X rays in 2021:  Lumbar:  Well-preserved lumbar lordosis and intervertebral disc spaces.  Moderate  to severe lumbar spondylosis    Lumbar MRI in 2022: INDINGS:  #  Grade 1 anterolisthesis L3-4 and L4-5.  #  Vertebral body heights are well maintained.  #  Small meningioma within L2 and L4.  #  Conus terminates at T12-L1 without evidence of tethering.  #  Nerve roots appear normal.  #  Incidental findings: None.     #  L1-2: Normal.  #    #  L2-3: Normal.  #    #  L3-4: Mild degenerative disc disease. Grade 1 anterolisthesis. There is facet arthropathy and disc bulge causing moderate central canal stenosis with mild bilateral neural foraminal narrowing.  #    #  L4-5: Mild degenerative disc disease. Grade 1 anterolisthesis. Facet arthropathy and disc bulge causing moderate to severe central canal stenosis and mild bilateral neural foraminal narrowing.  #    #  L5-S1: Facet arthropathy and disc bulge without significant central canal stenosis or neuroforaminal narrowing.     IMPRESSION:    There is facet arthropathy and disc bulge as well as grade 1 anterolisthesis causing moderate to severe central canal stenosis at the level of L4-5 and moderate canal stenosis at L3-4.   PATIENT SURVEYS:  Modified Oswestry Disability Index:  21 = 42%  COGNITION: Overall cognitive status: Within functional limits for tasks assessed     SENSATION: 2+ to LT b/t bilat LE  dermatomes  MUSCLE LENGTH: Hamstrings: tightness in bilat   PALPATION: Tenderness to palpation of  L sided lower lumbar paraspinals.   Pt has soft tissue tightness in L sided lumbar paraspinals.  R sided mainly WFL with slight tightness in some areas.   LUMBAR ROM:   AROM eval  Flexion WFL  Extension NT  Right lateral flexion 90%  Left lateral flexion WFL  Right rotation 60%  Left rotation 75%   (Blank rows = not tested)  LOWER EXTREMITY MMT:    MMT Right eval Left eval  Hip flexion 5/5 5/5  Hip extension    Hip abduction    Hip adduction    Hip internal rotation    Hip external rotation    Knee flexion 4+/5 seated 5/5 seated  Knee extension 4+/5 5/5  Ankle dorsiflexion 5/5 5/5  Ankle plantarflexion [facility] WFL  Ankle inversion    Ankle eversion     (Blank rows = not tested)  LUMBAR SPECIAL TESTS:  SLR test: negative bilat    TREATMENT:                                                                                                                               1/21 There-ex: Nustep x 5 mins lvl 5 Seated abd 3x12 GTB LAQ 3x12 GTB bilat Bicep curls 3x12 1 lb STS 3x10 from elevated surface Standing rows 3x12 BTB Standing extension 2x12 BTB - pt started feeling numbness in foot after 2 sets Supine bridge 3x12  Supine lumbar rotation 3x10   Manual: Pt received STM to bilat lumbar paraspinals in S/L w pillow b/w knees   1/15  There-ex:   Nustep x 5 minutes lvl 5 bilat UE/LE's Seated abd 3x12 GTB Seated rows 3x12 BTB STS 3x10 - had to elevate the surface to help dec pain  Bicep curls 3x12 1 lb LAQ 3x12 GTB bilat  Manual Therapy:  Pt received STM to bilat lumbar paraspinals in S/L'ing with pillow b/w knees.  1/13 Nustep x 5 minutes lvl 5 bilat UE/LE's Standing rows with TrA with RTB 3x10 Standing shoulder extension with RTB with TrA 3x10 Supine bridge with TrA 2x10 Supine lumbar rotation 2x10 Supine PPT 2x10 Supine clams with GTB with TrA  2x10 Supine piriformis stretch 2x30 sec bilat  Manual Therapy:  Pt received STM to bilat lumbar paraspinals in S/L'ing with pillow b/w knees.  1/9 Nustep 5 minutes Manual: trigger point release and review of self trigger point release  Bilateral LAD with grade 2 and 3 oscillations  Neuromuscular reeducation: Bicep curl 3x12 1 lb Standing Row 3x10 red  Standing Ext 3x10 red  Bilateral ER 3x12 1 lb  - 3 lb caused numbness  Straight shoulder flex 3x10 All with posture and breath cueing   1/6 Manual: trigger point release and review of self trigger point release  Bilateral LAD with grade 2 and 3 oscillations  Neuromuscular reeducation: Reviewed seated series of exercises.  All exercises performed with cueing for posture Hip abduction 3x10 2 sets red then moved to  green  LAQ 3x12  Ball squeeze with breathing   Updated and reviewed HEP   1/2  Manual: trigger point release and review of self trigger point release   There-ex:  Gluteal stretch 2x20 sec hold LTR x10  Seated ball roll out x10    Neuro-re-ed:  Supine march 3x10  Hip abduction green 3x10  Bridge 3x10      PT reviewed HEP. PT answered pt's questions concerning exercises and HEP.  Pt performed supine lumbar rotation.   PATIENT EDUCATION:  Education details: dx, rationale of interventions, relevant anatomy, POC, HEP, and exercise form.  PT answered pt's questions. Person educated: Patient Education method: Explanation, Demonstration, Verbal cues, and Handouts Education comprehension: verbalized understanding and returned demonstration  HOME EXERCISE PROGRAM: [id number] neck  ASSESSMENT:  CLINICAL IMPRESSION: Pt came in with some soreness today however he had applied ice to the area prior to tx and stated that helped. Pt tolerated exercises well. During standing extension, pt began to experience some numbness in his foot and PT transitioned to supine exercises. PT tried to take things lightly due to pt  upcoming surgery.   OBJECTIVE IMPAIRMENTS: decreased activity tolerance, decreased mobility, difficulty walking, decreased ROM, decreased strength, increased fascial restrictions, and pain.   ACTIVITY LIMITATIONS: lifting, standing, bathing, and locomotion level  PARTICIPATION LIMITATIONS: cleaning, shopping, and community activity  PERSONAL FACTORS: Time since onset of injury/illness/exacerbation and 1 comorbidity: cervical pain are also affecting patient's functional outcome.   REHAB POTENTIAL: Good  CLINICAL DECISION MAKING: Stable/uncomplicated  EVALUATION COMPLEXITY: Low   GOALS:  SHORT TERM GOALS: Target date: 08/28/2024   Pt will be independent and compliant with HEP for improved pain, core strength, and function.   Baseline: Goal status: INITIAL  2.  Pt will report at least a 25% improvement in pain and sx's overall.  Baseline:  Goal status: INITIAL  3.  Pt's worst pain will be no > 7/10. Baseline:  Goal status: INITIAL  4.  Pt will demo improved lumbar rotation AROM to be [facility] bilat for improved stiffness and daily mobility.  Baseline:  Goal status: INITIAL    LONG TERM GOALS: Target date: 09/18/2024   Pt will demo improved core strength as evidenced by performance of core exercises without adverse effects and improved R knee strength to 5/5 MMT for improved performance of and tolerance with functional mobility.  Baseline:  Goal status: INITIAL  2.  Pt will report he is able to ambulate extended community distance without significant lumbar pain and without increased LE pain/sx's. Baseline:  Goal status: INITIAL  3.  Pt will report at least a 70% improvement in pain and sx's overall including with standing activities..  Baseline:  Goal status: INITIAL  4.  Pt will be able to stand to take a shower without increased pain and sx's.   Baseline:  Goal status: INITIAL     PLAN:  PT FREQUENCY: 2x/week  PT DURATION: 6 weeks  PLANNED INTERVENTIONS:  [id number]- PT Re-evaluation, 97750- Physical Performance Testing, 97110-Therapeutic exercises, 97530- Therapeutic activity, [id number]- Neuromuscular re-education, 97535- Self Care, [id number]- Manual therapy, [phone]- Gait training, [phone]- Aquatic Therapy, [phone]- Electrical stimulation (unattended), [phone]- Electrical stimulation (manual), [id number]- Ultrasound, Patient/Family education, Stair training, Taping, Joint mobilization, Cryotherapy, and Moist heat.  [id number] (1-2 muscles), [id number] (3+ muscles)- Dry Needling   PLAN FOR NEXT SESSION:  Establish HEP.  Core strengthening.  STM to lumbar paraspinals and possibly glute.  Consider neural flossing   Nyoka Pearson, SPT  08/29/24 3:10 PM "

## 2024-08-30 ENCOUNTER — Encounter (HOSPITAL_BASED_OUTPATIENT_CLINIC_OR_DEPARTMENT_OTHER): Payer: Self-pay | Admitting: Physical Therapy

## 2024-08-30 NOTE — Therapy (Deleted)
 " OUTPATIENT PHYSICAL THERAPY THORACOLUMBAR TREATMENT   Patient Name: Dustin Rice MRN: [id number] DOB:01/31/1956, 69 y.o., male Today's Date: 08/30/2024  END OF SESSION:  PT End of Session - 08/29/24 1340     Visit Number 23    Number of Visits 29    Date for Recertification  09/18/24    Authorization Type MCR A&B    PT Start Time 1302    PT Stop Time 1345    PT Time Calculation (min) 43 min    Activity Tolerance Patient tolerated treatment well    Behavior During Therapy WFL for tasks assessed/performed             Past Medical History:  Diagnosis Date   Allergic rhinitis    Cervical spine disease    Esophageal reflux    GERD (gastroesophageal reflux disease)    Gilbert's syndrome    Hypercholesterolemia    Hyperlipidemia    Inguinal hernia    RIGHT   Prostatitis    RBBB    Shingles 03/2014   Past Surgical History:  Procedure Laterality Date   HERNIA REPAIR  2004   Patient Active Problem List   Diagnosis Date Noted   Benign localized prostatic hyperplasia with lower urinary tract symptoms (LUTS) 04/05/2024   Chronic pain 04/05/2024   Epigastric pain 04/05/2024   Gastroesophageal reflux disease 04/05/2024   Hematochezia 04/05/2024   Hypercholesterolemia 04/05/2024   Internal hemorrhoids 04/05/2024   Intestinal malabsorption 04/05/2024   Lumbago with sciatica, left side 04/05/2024   Screen for colon cancer 04/05/2024   RBBB 07/14/2023   Dyslipidemia 07/14/2023   Elevated coronary artery calcium  score 07/14/2023   Spinal stenosis of lumbar region 05/13/2023   Spondylolisthesis at L4-L5 level 05/13/2023   Palpitations 05/12/2016     REFERRING PROVIDER: Colon Shove, MD   REFERRING DIAG: M43.16 Spondylolisthesis at L4-5  Rationale for Evaluation and Treatment: Rehabilitation  THERAPY DIAG:  Other low back pain  Muscle weakness (generalized)  Radiculopathy, cervical region  Other symptoms and signs involving the musculoskeletal  system  Other muscle spasm  ONSET DATE: Onset Nov. 2021, Exacerbation Nov. 2025  SUBJECTIVE:                                                                                                                                                                                           SUBJECTIVE STATEMENT: Pt had soreness after last session. States that he's having a lot of pain and stiffness at night after being up and about during the day and in the mornings. Took some tylenol this morning and did some stretching. Pain and soreness in both neck  and back but states that back is worse than neck. Is careful to avoid activity that aggravates it but it's hard to avoid aggravating activity when just being on his feet causes that soreness.   Still plans to see doc tomorrow - originally made appointment for neck but plans to talk about the back too. Reviewing MRI of neck. As of now, not scheduled to have another injection till February but will see if he will get one from tomorrow.   PERTINENT HISTORY:  Cervical anterolisthesis  Hx of chronic lumbar pain--spinal stenosis and Grade 1 anterolisthesis L3-L4 and L4-5.  He receives injections.  PAIN:   NPRS:  1/10 current, 10/10 worst, 0/10 best   Location: L > R lumbar flank.  L > R LE, all the way to the foot  Type:  pressure, pushing, N/T Easing Factors:  bending, sitting Aggravating factors:  standing, lumbar extension  4/10 cervical pain  PRECAUTIONS: Other: cervical and lumbar anterolisthesis     WEIGHT BEARING RESTRICTIONS: No  FALLS:  Has patient fallen in last 6 months? Yes. 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   PLOF: Independent  PATIENT GOALS: to improve pain    OBJECTIVE:   Note: Objective measures were completed at Evaluation unless otherwise noted.  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  Adjacent-level deg changes, causing severe C4-5 spinal canal stenosis with spinal cord compression.  Associated abnormal cord signal, representing myelomalacia or cord edema.   Moderate C2-3, moderate to severe C3-4, and moderate to severe C6-7 spinal canal stenosis Moderate and severe neural foraminal stenosis at C2-3, C3-4, C4-5, and C6-7 described above. Multilevel degenerative listhesis.       X rays in 2021:  Lumbar:  Well-preserved lumbar lordosis and intervertebral disc spaces.  Moderate  to severe lumbar spondylosis    Lumbar MRI in 2022: INDINGS:  #  Grade 1 anterolisthesis L3-4 and L4-5.  #  Vertebral body heights are well maintained.  #  Small meningioma within L2 and L4.  #  Conus terminates at T12-L1 without evidence of tethering.  #  Nerve roots appear normal.  #  Incidental findings: None.     #  L1-2: Normal.  #    #  L2-3: Normal.  #    #  L3-4: Mild degenerative disc disease. Grade 1 anterolisthesis. There is facet arthropathy and disc bulge causing moderate central canal stenosis with mild bilateral neural foraminal narrowing.  #    #  L4-5: Mild degenerative disc disease. Grade 1 anterolisthesis. Facet arthropathy and disc bulge causing moderate to severe central canal stenosis and mild bilateral neural foraminal narrowing.  #    #  L5-S1: Facet arthropathy and disc bulge without significant central canal stenosis or neuroforaminal narrowing.     IMPRESSION:    There is facet arthropathy and disc bulge as well as grade 1 anterolisthesis causing moderate to severe central canal stenosis at the level of L4-5 and moderate canal stenosis at L3-4.    PATIENT SURVEYS:  Modified Oswestry Disability Index:  21 = 42%  COGNITION: Overall cognitive status: Within functional limits for  tasks assessed     SENSATION: 2+ to LT b/t bilat LE dermatomes  MUSCLE LENGTH: Hamstrings: tightness in bilat   PALPATION: Tenderness to palpation of L sided lower lumbar paraspinals.   Pt has soft tissue tightness in L sided lumbar paraspinals.  R sided mainly WFL with slight tightness in some areas.   LUMBAR ROM:   AROM eval  Flexion WFL  Extension NT  Right lateral flexion 90%  Left lateral flexion WFL  Right rotation 60%  Left rotation 75%   (Blank rows = not tested)  LOWER EXTREMITY MMT:    MMT Right eval Left eval  Hip flexion 5/5 5/5  Hip extension    Hip abduction    Hip adduction    Hip internal rotation    Hip external rotation    Knee flexion 4+/5 seated 5/5 seated  Knee extension 4+/5 5/5  Ankle dorsiflexion 5/5 5/5  Ankle plantarflexion [facility] WFL  Ankle inversion    Ankle eversion     (Blank rows = not tested)  LUMBAR SPECIAL TESTS:  SLR test: negative bilat    TREATMENT:                                                                                                                               1/15  There-ex:   Nustep x 5 minutes lvl 5 bilat UE/LE's Seated abd 3x12 GTB Seated rows 3x12 BTB STS 3x10 - had to elevate the surface to help dec pain  Bicep curls 3x12 1 lb LAQ 2x10 bilat  Manual Therapy:  Pt received STM to bilat lumbar paraspinals in S/L'ing with pillow b/w knees.  1/13 Nustep x 5 minutes lvl 5 bilat UE/LE's Standing rows with TrA with RTB 3x10 Standing shoulder extension with RTB with TrA 3x10 Supine bridge with TrA 2x10 Supine lumbar rotation 2x10 Supine PPT 2x10 Supine clams with GTB with TrA 2x10 Supine piriformis stretch 2x30 sec bilat  Manual Therapy:  Pt received STM to bilat lumbar paraspinals in S/L'ing with pillow b/w knees.  1/9 Nustep 5 minutes Manual: trigger point  release and review of self trigger point release  Bilateral LAD with grade 2 and 3 oscillations  Neuromuscular reeducation: Bicep curl 3x12 1 lb Standing Row 3x10 red  Standing Ext 3x10 red  Bilateral ER 3x12 1 lb  - 3 lb caused numbness  Straight shoulder flex 3x10 All with posture and breath cueing   1/6 Manual: trigger point release and review of self trigger point release  Bilateral LAD with grade 2 and 3 oscillations  Neuromuscular reeducation: Reviewed seated series of exercises.  All exercises performed with cueing for posture Hip abduction 3x10 2 sets red then moved to green  LAQ 3x12  Ball squeeze with breathing   Updated and reviewed HEP   1/2  Manual: trigger point release and review of self trigger point release   There-ex:  Gluteal stretch 2x20 sec hold LTR x10  Seated  ball roll out x10    Neuro-re-ed:  Supine march 3x10  Hip abduction green 3x10  Bridge 3x10      PT reviewed HEP. PT answered pt's questions concerning exercises and HEP.  Pt performed supine lumbar rotation.   PATIENT EDUCATION:  Education details: dx, rationale of interventions, relevant anatomy, POC, HEP, and exercise form.  PT answered pt's questions. Person educated: Patient Education method: Explanation, Demonstration, Verbal cues, and Handouts Education comprehension: verbalized understanding and returned demonstration  HOME EXERCISE PROGRAM: [id number] neck  ASSESSMENT:  CLINICAL IMPRESSION: Pt came in with some pain and soreness today. PT focused on exercise consistency and tried to target UE and LE systems with core activation. Pt's back flared up during STS but flare subsided when PT elevated table. Next session, PT will follow up on pt Dr visit re: cervical region to help guide future exercise plans. Pt is progressing overall and will benefit from continuation of tx.   OBJECTIVE IMPAIRMENTS: decreased activity tolerance, decreased mobility, difficulty walking, decreased  ROM, decreased strength, increased fascial restrictions, and pain.   ACTIVITY LIMITATIONS: lifting, standing, bathing, and locomotion level  PARTICIPATION LIMITATIONS: cleaning, shopping, and community activity  PERSONAL FACTORS: Time since onset of injury/illness/exacerbation and 1 comorbidity: cervical pain are also affecting patient's functional outcome.   REHAB POTENTIAL: Good  CLINICAL DECISION MAKING: Stable/uncomplicated  EVALUATION COMPLEXITY: Low   GOALS:  SHORT TERM GOALS: Target date: 08/28/2024   Pt will be independent and compliant with HEP for improved pain, core strength, and function.   Baseline: Goal status: INITIAL  2.  Pt will report at least a 25% improvement in pain and sx's overall.  Baseline:  Goal status: INITIAL  3.  Pt's worst pain will be no > 7/10. Baseline:  Goal status: INITIAL  4.  Pt will demo improved lumbar rotation AROM to be [facility] bilat for improved stiffness and daily mobility.  Baseline:  Goal status: INITIAL    LONG TERM GOALS: Target date: 09/18/2024   Pt will demo improved core strength as evidenced by performance of core exercises without adverse effects and improved R knee strength to 5/5 MMT for improved performance of and tolerance with functional mobility.  Baseline:  Goal status: INITIAL  2.  Pt will report he is able to ambulate extended community distance without significant lumbar pain and without increased LE pain/sx's. Baseline:  Goal status: INITIAL  3.  Pt will report at least a 70% improvement in pain and sx's overall including with standing activities..  Baseline:  Goal status: INITIAL  4.  Pt will be able to stand to take a shower without increased pain and sx's.   Baseline:  Goal status: INITIAL     PLAN:  PT FREQUENCY: 2x/week  PT DURATION: 6 weeks  PLANNED INTERVENTIONS: [id number]- PT Re-evaluation, 97750- Physical Performance Testing, 97110-Therapeutic exercises, 97530- Therapeutic activity, [id number]-  Neuromuscular re-education, 97535- Self Care, [id number]- Manual therapy, [id number]- Gait training, [phone]- Aquatic Therapy, [phone]- Electrical stimulation (unattended), [phone]- Electrical stimulation (manual), [id number]- Ultrasound, Patient/Family education, Stair training, Taping, Joint mobilization, Cryotherapy, and Moist heat.  [id number] (1-2 muscles), [id number] (3+ muscles)- Dry Needling   PLAN FOR NEXT SESSION:  Establish HEP.  Core strengthening.  STM to lumbar paraspinals and possibly glute.  Consider neural flossing   Nyoka Pearson, SPT  08/30/24 9:01 AM      "

## 2024-09-04 ENCOUNTER — Ambulatory Visit: Admitting: Psychiatry

## 2024-09-05 ENCOUNTER — Other Ambulatory Visit (HOSPITAL_BASED_OUTPATIENT_CLINIC_OR_DEPARTMENT_OTHER): Payer: Self-pay

## 2024-09-05 MED ORDER — TRAMADOL HCL 50 MG PO TABS
50.0000 mg | ORAL_TABLET | Freq: Four times a day (QID) | ORAL | 3 refills | Status: AC | PRN
  Filled 2024-09-05: qty 30, 8d supply, fill #0

## 2024-09-06 ENCOUNTER — Ambulatory Visit (HOSPITAL_BASED_OUTPATIENT_CLINIC_OR_DEPARTMENT_OTHER): Admitting: Physical Therapy

## 2024-09-06 ENCOUNTER — Encounter (HOSPITAL_BASED_OUTPATIENT_CLINIC_OR_DEPARTMENT_OTHER): Payer: Self-pay | Admitting: Physical Therapy

## 2024-09-06 DIAGNOSIS — R29898 Other symptoms and signs involving the musculoskeletal system: Secondary | ICD-10-CM

## 2024-09-06 DIAGNOSIS — M5459 Other low back pain: Secondary | ICD-10-CM

## 2024-09-06 DIAGNOSIS — M6281 Muscle weakness (generalized): Secondary | ICD-10-CM

## 2024-09-07 ENCOUNTER — Encounter (HOSPITAL_BASED_OUTPATIENT_CLINIC_OR_DEPARTMENT_OTHER): Payer: Self-pay | Admitting: Physical Therapy

## 2024-09-07 ENCOUNTER — Other Ambulatory Visit (HOSPITAL_BASED_OUTPATIENT_CLINIC_OR_DEPARTMENT_OTHER): Payer: Self-pay

## 2024-09-07 ENCOUNTER — Ambulatory Visit (HOSPITAL_BASED_OUTPATIENT_CLINIC_OR_DEPARTMENT_OTHER): Payer: Self-pay | Admitting: Physical Therapy

## 2024-09-07 ENCOUNTER — Encounter: Payer: Self-pay | Admitting: Podiatry

## 2024-09-07 DIAGNOSIS — R29898 Other symptoms and signs involving the musculoskeletal system: Secondary | ICD-10-CM

## 2024-09-07 DIAGNOSIS — M6281 Muscle weakness (generalized): Secondary | ICD-10-CM

## 2024-09-07 DIAGNOSIS — M5459 Other low back pain: Secondary | ICD-10-CM | POA: Diagnosis not present

## 2024-09-07 NOTE — Therapy (Signed)
 " OUTPATIENT PHYSICAL THERAPY THORACOLUMBAR TREATMENT   Patient Name: Dustin Rice MRN: [id number] DOB:29-Jun-1956, 69 y.o., male Today's Date: 09/07/2024  END OF SESSION:  PT End of Session - 09/07/24 1026     Visit Number 25    Number of Visits 29    Date for Recertification  09/18/24    Authorization Type MCR A&B    PT Start Time 0935    PT Stop Time 1017    PT Time Calculation (min) 42 min    Activity Tolerance Patient tolerated treatment well    Behavior During Therapy WFL for tasks assessed/performed              Past Medical History:  Diagnosis Date   Allergic rhinitis    Cervical spine disease    Esophageal reflux    GERD (gastroesophageal reflux disease)    Gilbert's syndrome    Hypercholesterolemia    Hyperlipidemia    Inguinal hernia    RIGHT   Prostatitis    RBBB    Shingles 03/2014   Past Surgical History:  Procedure Laterality Date   HERNIA REPAIR  2004   Patient Active Problem List   Diagnosis Date Noted   Benign localized prostatic hyperplasia with lower urinary tract symptoms (LUTS) 04/05/2024   Chronic pain 04/05/2024   Epigastric pain 04/05/2024   Gastroesophageal reflux disease 04/05/2024   Hematochezia 04/05/2024   Hypercholesterolemia 04/05/2024   Internal hemorrhoids 04/05/2024   Intestinal malabsorption 04/05/2024   Lumbago with sciatica, left side 04/05/2024   Screen for colon cancer 04/05/2024   RBBB 07/14/2023   Dyslipidemia 07/14/2023   Elevated coronary artery calcium  score 07/14/2023   Spinal stenosis of lumbar region 05/13/2023   Spondylolisthesis at L4-L5 level 05/13/2023   Palpitations 05/12/2016     REFERRING PROVIDER: Colon Shove, MD   REFERRING DIAG: M43.16 Spondylolisthesis at L4-5  Rationale for Evaluation and Treatment: Rehabilitation  THERAPY DIAG:  Other low back pain  Muscle weakness (generalized)  Other symptoms and signs involving the musculoskeletal system  ONSET DATE: Onset Nov. 2021,  Exacerbation Nov. 2025  SUBJECTIVE:                                                                                                                                                                                           SUBJECTIVE STATEMENT: Pt is scheduled to get an injection for his lower back on Feb 12 and neck surgery on Feb 17th.  Pt denies any adverse effects after prior treatment.  Pt states he has some tightness in L glute and L lateral hip.       PERTINENT HISTORY:  Cervical anterolisthesis  Hx of chronic lumbar pain--spinal stenosis and Grade 1 anterolisthesis L3-L4 and L4-5.  He receives injections.  PAIN:   NPRS:  2/10 current, 10/10 worst, 0/10 best   Location: L > R lumbar flank.  L > R LE, all the way to the foot  Type:  pressure, pushing, N/T Easing Factors:  bending, sitting Aggravating factors:  standing, lumbar extension  1/10 cervical pain  Pt took a tramadol  earlier.  PRECAUTIONS: Other: cervical and lumbar anterolisthesis     WEIGHT BEARING RESTRICTIONS: No  FALLS:  Has patient fallen in last 6 months? Yes. 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   PLOF: Independent  PATIENT GOALS: to improve pain    OBJECTIVE:  Note: Objective measures were completed at Evaluation unless otherwise noted.  DIAGNOSTIC FINDINGS:  Lumbar MRI: Degenerative changes as follows: T12-L1: No significant stenosis L1-L2: No significant stenosis L2-L3: No significant stenosis L3-L4: Small disc bulge. Facet arthrosis/ligamentum flavum hypertrophy. Minimal anterolisthesis of L3 on L4. Moderate central canal stenosis. Minimal bilateral foraminal narrowing. No significant interval change. L4-L5: Broad-based disc bulge. Facet  arthrosis with ligamentum flavum hypertrophy and small joint effusions. Progressed mild anterolisthesis of L4 on L5 by approximately 5 mm. Severe central canal stenosis. Severe bilateral subarticular stenosis, right greater than left. Mild bilateral foraminal stenosis. Progression of spinal canal and foraminal stenosis when compared to previous exam. L5-S1: Disc bulge with small broad-based central protrusion. Facet arthrosis. Mild central canal stenosis. Moderate bilateral subarticular zone narrowing. Moderate left and mild right foraminal stenosis. Slight progression of subarticular and foraminal narrowing compared to previous exam.  Other: -Localizer images demonstrate a T2 hyperintense liver lesion measuring approximately 4.5 cm in the right hepatic lobe.SABRA   IMPRESSION: 1. Lumbar spondylosis most advanced at L4-L5, with progression since the 2022 MRI. Please see above comments, for level-by-level discussion.  2. Incompletely imaged T2 hyperintense liver lesion seen only on the localizer images, potentially a cyst. Recommend a follow-up right upper quadrant ultrasound for further characterization.    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  Adjacent-level deg changes, causing severe C4-5 spinal canal stenosis with spinal cord compression.  Associated abnormal cord signal, representing myelomalacia or cord edema.   Moderate C2-3, moderate to severe C3-4, and moderate to severe C6-7 spinal canal stenosis Moderate and severe neural foraminal stenosis at C2-3, C3-4, C4-5, and C6-7 described above. Multilevel degenerative listhesis.       X rays in 2021:  Lumbar:  Well-preserved lumbar lordosis and intervertebral disc spaces.  Moderate  to severe lumbar spondylosis    Lumbar MRI in 2022: INDINGS:  #  Grade 1 anterolisthesis  L3-4 and L4-5.  #  Vertebral body heights are well maintained.  #  Small meningioma within L2 and L4.  #  Conus terminates at T12-L1 without evidence of tethering.  #  Nerve roots appear normal.  #  Incidental findings: None.     #  L1-2: Normal.  #    #  L2-3: Normal.  #    #  L3-4: Mild degenerative disc disease. Grade 1 anterolisthesis. There is facet arthropathy and disc bulge  causing moderate central canal stenosis with mild bilateral neural foraminal narrowing.  #    #  L4-5: Mild degenerative disc disease. Grade 1 anterolisthesis. Facet arthropathy and disc bulge causing moderate to severe central canal stenosis and mild bilateral neural foraminal narrowing.  #    #  L5-S1: Facet arthropathy and disc bulge without significant central canal stenosis or neuroforaminal narrowing.     IMPRESSION:    There is facet arthropathy and disc bulge as well as grade 1 anterolisthesis causing moderate to severe central canal stenosis at the level of L4-5 and moderate canal stenosis at L3-4.   PATIENT SURVEYS:  Modified Oswestry Disability Index:  21 = 42%  COGNITION: Overall cognitive status: Within functional limits for tasks assessed     SENSATION: 2+ to LT b/t bilat LE dermatomes  MUSCLE LENGTH: Hamstrings: tightness in bilat   PALPATION: Tenderness to palpation of L sided lower lumbar paraspinals.   Pt has soft tissue tightness in L sided lumbar paraspinals.  R sided mainly WFL with slight tightness in some areas.   LUMBAR ROM:   AROM eval  Flexion WFL  Extension NT  Right lateral flexion 90%  Left lateral flexion WFL  Right rotation 60%  Left rotation 75%   (Blank rows = not tested)  LOWER EXTREMITY MMT:    MMT Right eval Left eval  Hip flexion 5/5 5/5  Hip extension    Hip abduction    Hip adduction    Hip internal rotation    Hip external rotation    Knee flexion 4+/5 seated 5/5 seated  Knee extension 4+/5 5/5  Ankle dorsiflexion 5/5 5/5  Ankle  plantarflexion [facility] WFL  Ankle inversion    Ankle eversion     (Blank rows = not tested)  LUMBAR SPECIAL TESTS:  SLR test: negative bilat    TREATMENT:                                                                                                                                1/30 Nustep x 5 mins lvl 5 Supine PPT 2x10 Supine lumbar rotation Supine piriformis stretch 2x30 sec bilat Sit to stands with TrA 2x10 from table Standing rows 2x12 BTB with TrA Standing shoulder extension with GTB with TrA 2x12  Manual: Pt received STM to bilat lumbar paraspinals in S/L w pillow b/w knees    1/29 Nustep x 5 mins lvl 5 Seated hip abd with GTB 3x12 with TrA LAQ with GTB 3x10 Standing rows 2x12 BTB with TrA Standing shoulder extension with GTB with TrA 2x12 Supine PPT 2x10 Supine lumbar rotation Supine piriformis stretch 2x30 sec bilat    1/21 There-ex: Nustep x 5 mins lvl 5 Seated abd 3x12 GTB LAQ 3x12 GTB bilat Bicep curls 3x12 1 lb STS 3x10 from elevated surface  Neuro re-ed:  Standing rows 3x12 BTB Standing extension 2x12 BTB - pt started feeling numbness in foot after 2 sets  Supine bridge 3x12  Supine lumbar rotation 3x10   Manual: Pt received STM to bilat lumbar paraspinals in S/L w pillow b/w knees   1/15  There-ex:   Nustep x 5 minutes lvl 5 bilat UE/LE's Seated abd 3x12 GTB Seated rows 3x12 BTB STS 3x10 - had to elevate the surface to help dec pain  Bicep curls 3x12 1 lb LAQ 3x12 GTB bilat  Manual Therapy:  Pt received STM to bilat lumbar paraspinals in S/L'ing with pillow b/w knees.  1/13 Nustep x 5 minutes lvl 5 bilat UE/LE's Standing rows with TrA with RTB 3x10 Standing shoulder extension with RTB with TrA 3x10 Supine bridge with TrA 2x10 Supine lumbar rotation 2x10 Supine PPT 2x10 Supine clams with GTB with TrA 2x10 Supine piriformis stretch 2x30 sec bilat  Manual Therapy:  Pt received STM to bilat lumbar paraspinals in S/L'ing with pillow  b/w knees.  1/9 Nustep 5 minutes Manual: trigger point release and review of self trigger point release  Bilateral LAD with grade 2 and 3 oscillations  Neuromuscular reeducation: Bicep curl 3x12 1 lb Standing Row 3x10 red  Standing Ext 3x10 red  Bilateral ER 3x12 1 lb  - 3 lb caused numbness  Straight shoulder flex 3x10 All with posture and breath cueing   1/6 Manual: trigger point release and review of self trigger point release  Bilateral LAD with grade 2 and 3 oscillations  Neuromuscular reeducation: Reviewed seated series of exercises.  All exercises performed with cueing for posture Hip abduction 3x10 2 sets red then moved to green  LAQ 3x12  Ball squeeze with breathing   Updated and reviewed HEP   1/2  Manual: trigger point release and review of self trigger point release   There-ex:  Gluteal stretch 2x20 sec hold LTR x10  Seated ball roll out x10    Neuro-re-ed:  Supine march 3x10  Hip abduction green 3x10  Bridge 3x10      PT reviewed HEP. PT answered pt's questions concerning exercises and HEP.  Pt performed supine lumbar rotation.   PATIENT EDUCATION:  Education details: dx, rationale of interventions, relevant anatomy, POC, HEP, and exercise form.  PT answered pt's questions. Person educated: Patient Education method: Explanation, Demonstration, Verbal cues, and Handouts Education comprehension: verbalized understanding and returned demonstration  HOME EXERCISE PROGRAM: [id number] neck  ASSESSMENT:  CLINICAL IMPRESSION: PT performed exercises to improve core, functional, and postural strength and lumbar mobility.  He performed exercises well with cuing for correct form.  PT performed STM to bilat lumbar paraspinals to improve soft tissue tightness and mobility and pain and to reduce myofascial restrictions.  He responded well to treatment reporting improved pain from 2/10 before treatment to 1/10 after treatment.    OBJECTIVE IMPAIRMENTS:  decreased activity tolerance, decreased mobility, difficulty walking, decreased ROM, decreased strength, increased fascial restrictions, and pain.   ACTIVITY LIMITATIONS: lifting, standing, bathing, and locomotion level  PARTICIPATION LIMITATIONS: cleaning, shopping, and community activity  PERSONAL FACTORS: Time since onset of injury/illness/exacerbation and 1 comorbidity: cervical pain are also affecting patient's functional outcome.   REHAB POTENTIAL: Good  CLINICAL DECISION MAKING: Stable/uncomplicated  EVALUATION COMPLEXITY: Low   GOALS:  SHORT TERM GOALS: Target date: 08/28/2024   Pt will be independent and compliant with HEP for improved pain, core strength, and function.   Baseline: Goal status: INITIAL  2.  Pt will report at least a 25% improvement in pain and sx's overall.  Baseline:  Goal status: INITIAL  3.  Pt's worst pain  will be no > 7/10. Baseline:  Goal status: INITIAL  4.  Pt will demo improved lumbar rotation AROM to be [facility] bilat for improved stiffness and daily mobility.  Baseline:  Goal status: INITIAL    LONG TERM GOALS: Target date: 09/18/2024   Pt will demo improved core strength as evidenced by performance of core exercises without adverse effects and improved R knee strength to 5/5 MMT for improved performance of and tolerance with functional mobility.  Baseline:  Goal status: INITIAL  2.  Pt will report he is able to ambulate extended community distance without significant lumbar pain and without increased LE pain/sx's. Baseline:  Goal status: INITIAL  3.  Pt will report at least a 70% improvement in pain and sx's overall including with standing activities..  Baseline:  Goal status: INITIAL  4.  Pt will be able to stand to take a shower without increased pain and sx's.   Baseline:  Goal status: INITIAL     PLAN:  PT FREQUENCY: 2x/week  PT DURATION: 6 weeks  PLANNED INTERVENTIONS: [id number]- PT Re-evaluation, 97750- Physical  Performance Testing, 97110-Therapeutic exercises, 97530- Therapeutic activity, [id number]- Neuromuscular re-education, 97535- Self Care, [id number]- Manual therapy, [phone]- Gait training, [phone]- Aquatic Therapy, [phone]- Electrical stimulation (unattended), [phone]- Electrical stimulation (manual), [id number]- Ultrasound, Patient/Family education, Stair training, Taping, Joint mobilization, Cryotherapy, and Moist heat.  [id number] (1-2 muscles), [id number] (3+ muscles)- Dry Needling   PLAN FOR NEXT SESSION:  Establish HEP.  Core strengthening.  STM to lumbar paraspinals and possibly glute.  Consider neural flossing   Leigh Minerva III PT, DPT 09/07/24 1:46 PM  "

## 2024-09-12 ENCOUNTER — Other Ambulatory Visit: Payer: Self-pay | Admitting: Lab

## 2024-09-12 DIAGNOSIS — B351 Tinea unguium: Secondary | ICD-10-CM

## 2024-09-12 MED ORDER — TERBINAFINE HCL 250 MG PO TABS: 250.0000 mg | ORAL_TABLET | Freq: Every day | ORAL | Status: AC

## 2024-09-14 ENCOUNTER — Ambulatory Visit (HOSPITAL_BASED_OUTPATIENT_CLINIC_OR_DEPARTMENT_OTHER): Attending: Student | Admitting: Physical Therapy

## 2024-09-14 ENCOUNTER — Other Ambulatory Visit (HOSPITAL_BASED_OUTPATIENT_CLINIC_OR_DEPARTMENT_OTHER): Payer: Self-pay

## 2024-09-14 ENCOUNTER — Encounter (HOSPITAL_BASED_OUTPATIENT_CLINIC_OR_DEPARTMENT_OTHER): Payer: Self-pay | Admitting: Physical Therapy

## 2024-09-14 ENCOUNTER — Other Ambulatory Visit: Payer: Self-pay

## 2024-09-14 DIAGNOSIS — R29898 Other symptoms and signs involving the musculoskeletal system: Secondary | ICD-10-CM

## 2024-09-14 DIAGNOSIS — M6281 Muscle weakness (generalized): Secondary | ICD-10-CM

## 2024-09-14 DIAGNOSIS — M5459 Other low back pain: Secondary | ICD-10-CM

## 2024-09-14 NOTE — Therapy (Signed)
 " OUTPATIENT PHYSICAL THERAPY THORACOLUMBAR TREATMENT   Patient Name: Dustin Rice MRN: [id number] DOB:[birth year], 69 y.o., male Today's Date: 09/14/2024  END OF SESSION:  PT End of Session - 09/14/24 1030     Visit Number 26    Number of Visits 29    Date for Recertification  09/18/24    Authorization Type MCR A&B    PT Start Time 1018    PT Stop Time 1101    PT Time Calculation (min) 43 min    Activity Tolerance Patient tolerated treatment well    Behavior During Therapy WFL for tasks assessed/performed               Past Medical History:  Diagnosis Date   Allergic rhinitis    Cervical spine disease    Esophageal reflux    GERD (gastroesophageal reflux disease)    Gilbert's syndrome    Hypercholesterolemia    Hyperlipidemia    Inguinal hernia    RIGHT   Prostatitis    RBBB    Shingles 03/2014   Past Surgical History:  Procedure Laterality Date   HERNIA REPAIR  2004   Patient Active Problem List   Diagnosis Date Noted   Benign localized prostatic hyperplasia with lower urinary tract symptoms (LUTS) 04/05/2024   Chronic pain 04/05/2024   Epigastric pain 04/05/2024   Gastroesophageal reflux disease 04/05/2024   Hematochezia 04/05/2024   Hypercholesterolemia 04/05/2024   Internal hemorrhoids 04/05/2024   Intestinal malabsorption 04/05/2024   Lumbago with sciatica, left side 04/05/2024   Screen for colon cancer 04/05/2024   RBBB 07/14/2023   Dyslipidemia 07/14/2023   Elevated coronary artery calcium  score 07/14/2023   Spinal stenosis of lumbar region 05/13/2023   Spondylolisthesis at L4-L5 level 05/13/2023   Palpitations 05/12/2016     REFERRING PROVIDER: Colon Shove, MD   REFERRING DIAG: M43.16 Spondylolisthesis at L4-5  Rationale for Evaluation and Treatment: Rehabilitation  THERAPY DIAG:  Other low back pain  Muscle weakness (generalized)  Other symptoms and signs involving the musculoskeletal system  ONSET DATE: Onset Nov. 2021,  Exacerbation Nov. 2025  SUBJECTIVE:                                                                                                                                                                                           SUBJECTIVE STATEMENT: Pt states his back is really bothering him today and yesterday, possibly due to standing a lot.  Pt states his lumbar pain was 8-9/10 this AM.  He took some tylenol.  Pt used the heated seats in the car ride over here which felt better.  Pain currently a  6/10.  Pt is limited with standing duration.  He states he can only stand 10-15 mins and has to sit down due to pain.  He was unable to stand to cook.  Pt is scheduled to get an injection for his lower back on Feb 12 and neck surgery on Feb 17th.     PERTINENT HISTORY:  Cervical anterolisthesis  Hx of chronic lumbar pain--spinal stenosis and Grade 1 anterolisthesis L3-L4 and L4-5.  He receives injections.  PAIN:   NPRS:  2/10 current, 10/10 worst, 0/10 best   Location: L > R lumbar flank.  L > R LE, all the way to the foot  Type:  pressure, pushing, N/T Easing Factors:  bending, sitting Aggravating factors:  standing, lumbar extension  1/10 cervical pain  Pt took a tramadol  earlier.  PRECAUTIONS: Other: cervical and lumbar anterolisthesis     WEIGHT BEARING RESTRICTIONS: No  FALLS:  Has patient fallen in last 6 months? Yes. 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   PLOF: Independent  PATIENT GOALS: to improve pain    OBJECTIVE:  Note: Objective measures were completed at Evaluation unless otherwise noted.  DIAGNOSTIC FINDINGS:  Lumbar MRI: Degenerative changes as follows: T12-L1: No significant stenosis L1-L2: No significant  stenosis L2-L3: No significant stenosis L3-L4: Small disc bulge. Facet arthrosis/ligamentum flavum hypertrophy. Minimal anterolisthesis of L3 on L4. Moderate central canal stenosis. Minimal bilateral foraminal narrowing. No significant interval change. L4-L5: Broad-based disc bulge. Facet arthrosis with ligamentum flavum hypertrophy and small joint effusions. Progressed mild anterolisthesis of L4 on L5 by approximately 5 mm. Severe central canal stenosis. Severe bilateral subarticular stenosis, right greater than left. Mild bilateral foraminal stenosis. Progression of spinal canal and foraminal stenosis when compared to previous exam. L5-S1: Disc bulge with small broad-based central protrusion. Facet arthrosis. Mild central canal stenosis. Moderate bilateral subarticular zone narrowing. Moderate left and mild right foraminal stenosis. Slight progression of subarticular and foraminal narrowing compared to previous exam.  Other: -Localizer images demonstrate a T2 hyperintense liver lesion measuring approximately 4.5 cm in the right hepatic lobe.SABRA   IMPRESSION: 1. Lumbar spondylosis most advanced at L4-L5, with progression since the 2022 MRI. Please see above comments, for level-by-level discussion.  2. Incompletely imaged T2 hyperintense liver lesion seen only on the localizer images, potentially a cyst. Recommend a follow-up right upper quadrant ultrasound for further characterization.    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  Adjacent-level deg changes, causing severe C4-5 spinal canal stenosis with spinal cord compression.  Associated abnormal cord signal, representing myelomalacia or cord edema.   Moderate C2-3, moderate to severe C3-4, and moderate to severe C6-7 spinal canal stenosis Moderate and severe neural foraminal  stenosis at C2-3, C3-4, C4-5, and C6-7 described above. Multilevel degenerative listhesis.       X rays in 2021:  Lumbar:  Well-preserved lumbar lordosis and intervertebral disc spaces.  Moderate  to severe lumbar spondylosis    Lumbar MRI in 2022: INDINGS:  #  Grade 1 anterolisthesis L3-4 and L4-5.  #  Vertebral body heights are well maintained.  #  Small meningioma within L2 and L4.  #  Conus terminates at T12-L1 without evidence of tethering.  #  Nerve roots appear normal.  #  Incidental findings: None.     #  L1-2: Normal.  #    #  L2-3: Normal.  #    #  L3-4: Mild degenerative disc disease. Grade 1 anterolisthesis. There is facet arthropathy and disc bulge causing moderate central canal stenosis with mild bilateral neural foraminal narrowing.  #    #  L4-5: Mild degenerative disc disease. Grade 1 anterolisthesis. Facet arthropathy and disc bulge causing moderate to severe central canal stenosis and mild bilateral neural foraminal narrowing.  #    #  L5-S1: Facet arthropathy and disc bulge without significant central canal stenosis or neuroforaminal narrowing.     IMPRESSION:    There is facet arthropathy and disc bulge as well as grade 1 anterolisthesis causing moderate to severe central canal stenosis at the level of L4-5 and moderate canal stenosis at L3-4.   PATIENT SURVEYS:  Modified Oswestry Disability Index:  21 = 42%  COGNITION: Overall cognitive status: Within functional limits for tasks assessed     SENSATION: 2+ to LT b/t bilat LE dermatomes  MUSCLE LENGTH: Hamstrings: tightness in bilat   PALPATION: Tenderness to palpation of L sided lower lumbar paraspinals.   Pt has soft tissue tightness in L sided lumbar paraspinals.  R sided mainly WFL with slight tightness in some areas.   LUMBAR ROM:   AROM eval  Flexion WFL  Extension NT  Right lateral flexion 90%  Left lateral flexion WFL  Right rotation 60%  Left rotation 75%   (Blank rows = not  tested)  LOWER EXTREMITY MMT:    MMT Right eval Left eval  Hip flexion 5/5 5/5  Hip extension    Hip abduction    Hip adduction    Hip internal rotation    Hip external rotation    Knee flexion 4+/5 seated 5/5 seated  Knee extension 4+/5 5/5  Ankle dorsiflexion 5/5 5/5  Ankle plantarflexion [facility] WFL  Ankle inversion    Ankle eversion     (Blank rows = not tested)  LUMBAR SPECIAL TESTS:  SLR test: negative bilat    TREATMENT:                                                                                                                                2/6 Nustep x 7 mins lvl 5 Supine PPT 2x20 Supine lumbar rotation 2x10 Supine alt UE/LE  2x10 Sit to stands with TrA 2x10 from table Standing rows 2x12 BTB with TrA LAQ with GTB 3x10  Manual: Pt received STM to bilat lumbar paraspinals and L glute in S/L w pillow b/w knees    1/30 Nustep x 5 mins lvl 5 Supine PPT 2x10 Supine lumbar rotation Supine piriformis stretch 2x30 sec bilat Sit to stands with TrA 2x10 from table Standing rows 2x12 BTB with TrA Standing shoulder extension with  GTB with TrA 2x12  Manual: Pt received STM to bilat lumbar paraspinals in S/L w pillow b/w knees    1/29 Nustep x 5 mins lvl 5 Seated hip abd with GTB 3x12 with TrA LAQ with GTB 3x10 Standing rows 2x12 BTB with TrA Standing shoulder extension with GTB with TrA 2x12 Supine PPT 2x10 Supine lumbar rotation Supine piriformis stretch 2x30 sec bilat    1/21 There-ex: Nustep x 5 mins lvl 5 Seated abd 3x12 GTB LAQ 3x12 GTB bilat Bicep curls 3x12 1 lb STS 3x10 from elevated surface  Neuro re-ed:  Standing rows 3x12 BTB Standing extension 2x12 BTB - pt started feeling numbness in foot after 2 sets Supine bridge 3x12  Supine lumbar rotation 3x10   Manual: Pt received STM to bilat lumbar paraspinals in S/L w pillow b/w knees   1/15  There-ex:   Nustep x 5 minutes lvl 5 bilat UE/LE's Seated abd 3x12 GTB Seated rows 3x12  BTB STS 3x10 - had to elevate the surface to help dec pain  Bicep curls 3x12 1 lb LAQ 3x12 GTB bilat  Manual Therapy:  Pt received STM to bilat lumbar paraspinals in S/L'ing with pillow b/w knees.  1/13 Nustep x 5 minutes lvl 5 bilat UE/LE's Standing rows with TrA with RTB 3x10 Standing shoulder extension with RTB with TrA 3x10 Supine bridge with TrA 2x10 Supine lumbar rotation 2x10 Supine PPT 2x10 Supine clams with GTB with TrA 2x10 Supine piriformis stretch 2x30 sec bilat  Manual Therapy:  Pt received STM to bilat lumbar paraspinals in S/L'ing with pillow b/w knees.  1/9 Nustep 5 minutes Manual: trigger point release and review of self trigger point release  Bilateral LAD with grade 2 and 3 oscillations  Neuromuscular reeducation: Bicep curl 3x12 1 lb Standing Row 3x10 red  Standing Ext 3x10 red  Bilateral ER 3x12 1 lb  - 3 lb caused numbness  Straight shoulder flex 3x10 All with posture and breath cueing   1/6 Manual: trigger point release and review of self trigger point release  Bilateral LAD with grade 2 and 3 oscillations  Neuromuscular reeducation: Reviewed seated series of exercises.  All exercises performed with cueing for posture Hip abduction 3x10 2 sets red then moved to green  LAQ 3x12  Ball squeeze with breathing   Updated and reviewed HEP  PATIENT EDUCATION:  Education details: dx, rationale of interventions, relevant anatomy, POC, HEP, and exercise form.  PT answered pt's questions. Person educated: Patient Education method: Explanation, Demonstration, Verbal cues, and Handouts Education comprehension: verbalized understanding and returned demonstration  HOME EXERCISE PROGRAM: [id number] neck  ASSESSMENT:  CLINICAL IMPRESSION: Pt presents to treatment reporting increased lumbar pain over the past couple of days.  He is limited with standing duration and was unable to stand to cook last night.  PT focused on interventions to improve core, LE, and  postural strength, lumbar mobility, and soft tissue tightness.  He performed exercises well with cuing for correct form.  Pt had no significant tenderness with STM to lumbar paraspinals though did have tenderness in L glute.  He tolerated treatment well stating his back feels better after treatment than before treatment.  He is scheduled for a lumbar injection next week on the 12th.    OBJECTIVE IMPAIRMENTS: decreased activity tolerance, decreased mobility, difficulty walking, decreased ROM, decreased strength, increased fascial restrictions, and pain.   ACTIVITY LIMITATIONS: lifting, standing, bathing, and locomotion level  PARTICIPATION LIMITATIONS: cleaning, shopping, and community activity  PERSONAL FACTORS: Time since  onset of injury/illness/exacerbation and 1 comorbidity: cervical pain are also affecting patient's functional outcome.   REHAB POTENTIAL: Good  CLINICAL DECISION MAKING: Stable/uncomplicated  EVALUATION COMPLEXITY: Low   GOALS:  SHORT TERM GOALS: Target date: 08/28/2024   Pt will be independent and compliant with HEP for improved pain, core strength, and function.   Baseline: Goal status: INITIAL  2.  Pt will report at least a 25% improvement in pain and sx's overall.  Baseline:  Goal status: INITIAL  3.  Pt's worst pain will be no > 7/10. Baseline:  Goal status: INITIAL  4.  Pt will demo improved lumbar rotation AROM to be [facility] bilat for improved stiffness and daily mobility.  Baseline:  Goal status: INITIAL    LONG TERM GOALS: Target date: 09/18/2024   Pt will demo improved core strength as evidenced by performance of core exercises without adverse effects and improved R knee strength to 5/5 MMT for improved performance of and tolerance with functional mobility.  Baseline:  Goal status: INITIAL  2.  Pt will report he is able to ambulate extended community distance without significant lumbar pain and without increased LE pain/sx's. Baseline:  Goal  status: INITIAL  3.  Pt will report at least a 70% improvement in pain and sx's overall including with standing activities..  Baseline:  Goal status: INITIAL  4.  Pt will be able to stand to take a shower without increased pain and sx's.   Baseline:  Goal status: INITIAL     PLAN:  PT FREQUENCY: 2x/week  PT DURATION: 6 weeks  PLANNED INTERVENTIONS: [id number]- PT Re-evaluation, 97750- Physical Performance Testing, 97110-Therapeutic exercises, 97530- Therapeutic activity, [id number]- Neuromuscular re-education, 97535- Self Care, [id number]- Manual therapy, [phone]- Gait training, [phone]- Aquatic Therapy, [phone]- Electrical stimulation (unattended), [phone]- Electrical stimulation (manual), [id number]- Ultrasound, Patient/Family education, Stair training, Taping, Joint mobilization, Cryotherapy, and Moist heat.  [id number] (1-2 muscles), [id number] (3+ muscles)- Dry Needling   PLAN FOR NEXT SESSION:  Establish HEP.  Core strengthening.  STM to lumbar paraspinals and possibly glute.  Consider neural flossing   Leigh Minerva III PT, DPT 09/14/24 12:08 PM   "

## 2024-09-18 ENCOUNTER — Ambulatory Visit: Admitting: Psychiatry

## 2024-10-03 ENCOUNTER — Ambulatory Visit: Admitting: Psychiatry

## 2024-11-15 ENCOUNTER — Ambulatory Visit: Admitting: Cardiology
